# Patient Record
Sex: Male | Born: 1975 | Race: Black or African American | Hispanic: No | Smoking: Current every day smoker
Health system: Southern US, Community
[De-identification: ages and names within clinical notes are randomized; demographics above are authoritative.]

## PROBLEM LIST (undated history)

## (undated) DIAGNOSIS — F1910 Other psychoactive substance abuse, uncomplicated: Secondary | ICD-10-CM

## (undated) DIAGNOSIS — I10 Essential (primary) hypertension: Secondary | ICD-10-CM

## (undated) DIAGNOSIS — G7100 Muscular dystrophy, unspecified: Secondary | ICD-10-CM

## (undated) HISTORY — PX: NO PAST SURGERIES: SHX2092

## (undated) HISTORY — PX: OTHER SURGICAL HISTORY: SHX169

---

## 2013-05-19 ENCOUNTER — Emergency Department (HOSPITAL_COMMUNITY)
Admission: EM | Admit: 2013-05-19 | Discharge: 2013-05-19 | Disposition: A | Discharge status: Home / Self Care | Payer: Self-pay | Attending: Emergency Medicine | Admitting: Emergency Medicine

## 2013-05-19 ENCOUNTER — Emergency Department (HOSPITAL_COMMUNITY): Payer: Self-pay

## 2013-05-19 ENCOUNTER — Encounter (HOSPITAL_COMMUNITY): Payer: Self-pay | Admitting: Emergency Medicine

## 2013-05-19 DIAGNOSIS — X58XXXA Exposure to other specified factors, initial encounter: Secondary | ICD-10-CM | POA: Insufficient documentation

## 2013-05-19 DIAGNOSIS — S83005A Unspecified dislocation of left patella, initial encounter: Secondary | ICD-10-CM

## 2013-05-19 DIAGNOSIS — Y9383 Activity, rough housing and horseplay: Secondary | ICD-10-CM | POA: Insufficient documentation

## 2013-05-19 DIAGNOSIS — IMO0002 Reserved for concepts with insufficient information to code with codable children: Secondary | ICD-10-CM | POA: Insufficient documentation

## 2013-05-19 DIAGNOSIS — Y92009 Unspecified place in unspecified non-institutional (private) residence as the place of occurrence of the external cause: Secondary | ICD-10-CM | POA: Insufficient documentation

## 2013-05-19 DIAGNOSIS — S83006A Unspecified dislocation of unspecified patella, initial encounter: Secondary | ICD-10-CM | POA: Insufficient documentation

## 2013-05-19 DIAGNOSIS — F17200 Nicotine dependence, unspecified, uncomplicated: Secondary | ICD-10-CM | POA: Insufficient documentation

## 2013-05-19 MED ORDER — ETOMIDATE 2 MG/ML IV SOLN
10.0000 mg | Freq: Once | INTRAVENOUS | Status: AC
  Administered 2013-05-19: 10 mg via INTRAVENOUS
  Filled 2013-05-19: qty 10

## 2013-05-19 MED ORDER — HYDROMORPHONE HCL PF 1 MG/ML IJ SOLN: 1.0000 mg | Freq: Once | INTRAMUSCULAR | Status: DC

## 2013-05-19 MED ORDER — HYDROMORPHONE HCL PF 1 MG/ML IJ SOLN
1.0000 mg | Freq: Once | INTRAMUSCULAR | Status: AC
  Administered 2013-05-19: 1 mg via INTRAVENOUS
  Filled 2013-05-19: qty 1

## 2013-05-19 MED ORDER — HYDROCODONE-ACETAMINOPHEN 5-325 MG PO TABS: 2.0000 | ORAL_TABLET | ORAL | Status: DC | PRN

## 2013-05-19 MED ORDER — DIPHENHYDRAMINE HCL 50 MG/ML IJ SOLN
12.5000 mg | Freq: Once | INTRAMUSCULAR | Status: AC
  Administered 2013-05-19: 12.5 mg via INTRAVENOUS
  Filled 2013-05-19: qty 1

## 2013-05-19 NOTE — ED Notes (Signed)
Patient not breathing adequately to maintain O2 saturations. RT currently bagging patient at 100% O2. Sats Recovered to 99%.

## 2013-05-19 NOTE — ED Notes (Signed)
Procedure successful, reduction complete.

## 2013-05-19 NOTE — Progress Notes (Signed)
Orthopedic Tech Progress Note Patient Details:  Ronnie [name] 1975-09-29 [id number]  Ortho Devices Type of Ortho Device: Crutches   Haskell Flirt 05/19/2013, 3:11 AM

## 2013-05-19 NOTE — ED Notes (Signed)
Patient switched to non re-breather mask.

## 2013-05-19 NOTE — ED Notes (Signed)
Per EMS pt was wrestling at home on the floor and EMS states it appears pt has dislocated his left knee. Pt was given 100 mcg of fentanyl and it is reported the pt was drinking earlier this evening. Pt A&O X4. Pt placed in a left leg splint by EMS.

## 2013-05-19 NOTE — ED Provider Notes (Signed)
CSN: [id number]     Arrival date & time 05/19/13  0030 History   First MD Initiated Contact with Patient 05/19/13 0043     Chief Complaint  Patient presents with  . Knee Injury   (Consider location/radiation/quality/duration/timing/severity/associated sxs/prior Treatment) HPI 37 yo male presents to the ER from home via EMS with knee injury.  Pt reports he was wrestling with family when he had onset of deformity and pain to left kneecap.  No prior h/o same.  Pt is unable to bend the knee.  Pt received fentanyl in route without improvement.  Pt has been drinking tonight.  No other injuries or complaints.  History reviewed. No pertinent past medical history. History reviewed. No pertinent past surgical history. No family history on file. History  Substance Use Topics  . Smoking status: Current Every Day Smoker -- 0.50 packs/day  . Smokeless tobacco: Not on file  . Alcohol Use: Yes    Review of Systems  All other systems reviewed and are negative.    Allergies  Review of patient's allergies indicates no known allergies.  Home Medications  No current outpatient prescriptions on file. BP 145/100  Pulse 108  Temp(Src) 97.7 F (36.5 C) (Oral)  Resp 23  Ht 6' (1.829 m)  Wt 165 lb (74.844 kg)  BMI 22.37 kg/m2  SpO2 100% Physical Exam  Nursing note and vitals reviewed. Constitutional: He is oriented to person, place, and time. He appears well-developed and well-nourished. He appears distressed.  HENT:  Head: Normocephalic.  Nose: Nose normal.  Mouth/Throat: Oropharynx is clear and moist.  Abrasion left forehead  Eyes: Conjunctivae and EOM are normal. Pupils are equal, round, and reactive to light.  Neck: Normal range of motion. Neck supple. No JVD present. No tracheal deviation present. No thyromegaly present.  Cardiovascular: Normal rate, regular rhythm, normal heart sounds and intact distal pulses.  Exam reveals no gallop and no friction rub.   No murmur  heard. Pulmonary/Chest: Effort normal and breath sounds normal. No stridor. No respiratory distress. He has no wheezes. He has no rales. He exhibits no tenderness.  Abdominal: Soft. Bowel sounds are normal. He exhibits no distension and no mass. There is no tenderness. There is no rebound and no guarding.  Musculoskeletal: He exhibits tenderness.  Deformity noted to left patella.  Leg held in extension, lateral displacement of patella with rotation  Lymphadenopathy:    He has no cervical adenopathy.  Neurological: He is alert and oriented to person, place, and time. He exhibits normal muscle tone. Coordination normal.  Skin: Skin is warm and dry. No rash noted. No erythema. No pallor.  Psychiatric: He has a normal mood and affect. His behavior is normal. Judgment and thought content normal.    ED Course  Reduction of dislocation Date/Time: 05/19/2013 2:26 AM Performed by: Olivia Mackie Authorized by: Olivia Mackie Consent: Verbal consent obtained. written consent obtained. Risks and benefits: risks, benefits and alternatives were discussed Consent given by: patient and spouse Patient understanding: patient states understanding of the procedure being performed Patient consent: the patient's understanding of the procedure matches consent given Procedure consent: procedure consent matches procedure scheduled Relevant documents: relevant documents present and verified Test results: test results available and properly labeled Site marked: the operative site was marked Imaging studies: imaging studies available Required items: required blood products, implants, devices, and special equipment available Patient identity confirmed: verbally with patient and arm band Time out: Immediately prior to procedure a "time out" was called to verify  the correct patient, procedure, equipment, support staff and site/side marked as required. Preparation: Patient was prepped and draped in the usual sterile  fashion. Local anesthesia used: no Patient sedated: yes Sedatives: etomidate Analgesia: hydromorphone Sedation start date/time: 05/19/2013 2:04 AM Sedation end date/time: 05/19/2013 2:27 AM Vitals: Vital signs were monitored during sedation. Patient tolerance: Patient tolerated the procedure well with no immediate complications. Comments: Pt with lateral dislocation of patella.  Once patient was sedated, pt's knee was slowly flexed, and spontaneous reduction occurred.  Post procedure, patient had brief episode of hypoxia treated with bag valve mask.  Pt then recovered uneventfully.     (including critical care time) Labs Review Labs Reviewed - No data to display Imaging Review Dg Knee Complete 4 Views Left  05/19/2013   CLINICAL DATA:  Left knee injury, with left knee pain.  EXAM: LEFT KNEE - COMPLETE 4+ VIEW  COMPARISON:  None.  FINDINGS: There appears to be lateral dislocation of the patella, with associated abnormal rotation of the distal femur due to the position of the patella. The patella is lodged against the lateral femoral condyle. No definite knee joint effusion is identified, though it is difficult to assess for an effusion given the unusual positioning of the patella. No definite fracture is seen. The joint spaces are otherwise preserved. No significant degenerative change is seen.  The visualized soft tissues are normal in appearance.  IMPRESSION: Apparent lateral dislocation of the patella, with associated abnormal rotation of the distal femur due to the position of the patella; the patella is lodged against the lateral femoral condyle. No definite evidence of fracture.   Electronically Signed   By: Roanna Raider M.D.   On: 05/19/2013 01:17   Dg Knee Ap/lat W/sunrise Left  05/19/2013   CLINICAL DATA:  Postreduction  EXAM: DG KNEE - 3 VIEWS  COMPARISON:  Prior radiograph performed earlier on the same day  FINDINGS: The patella is now normally aligned with the intercondylar notch of  the distal femur. No acute fracture or or malalignment now seen about the knee. No significant joint effusion appreciated.  IMPRESSION: Patella in normal anatomic alignment status post reduction. No acute fracture.   Electronically Signed   By: Rise Mu M.D.   On: 05/19/2013 03:55    EKG Interpretation   None       MDM   1. Patellar dislocation, left, initial encounter    37 year old male with left patellar dislocation.  After conscious sedation able to easily reduce .  Reduction x-rays normal.  Patient placed in knee immobilizer and crutches, and told to followup with orthopedics.     Olivia Mackie, MD 05/19/13 2107

## 2013-12-31 ENCOUNTER — Encounter (HOSPITAL_COMMUNITY): Payer: Self-pay | Admitting: Emergency Medicine

## 2013-12-31 ENCOUNTER — Emergency Department (HOSPITAL_COMMUNITY)
Admission: EM | Admit: 2013-12-31 | Discharge: 2014-01-01 | Disposition: A | Discharge status: Home / Self Care | Payer: Self-pay | Attending: Emergency Medicine | Admitting: Emergency Medicine

## 2013-12-31 DIAGNOSIS — R209 Unspecified disturbances of skin sensation: Secondary | ICD-10-CM | POA: Insufficient documentation

## 2013-12-31 DIAGNOSIS — F17200 Nicotine dependence, unspecified, uncomplicated: Secondary | ICD-10-CM | POA: Insufficient documentation

## 2013-12-31 DIAGNOSIS — R202 Paresthesia of skin: Secondary | ICD-10-CM

## 2013-12-31 NOTE — ED Notes (Signed)
Pt. reports intermittent tingling/numbnes at both forearms and lower legs x1 month , denies injury or fall , ambulatory/respirations unlabored .

## 2014-01-01 LAB — I-STAT CHEM 8, ED
BUN: 7 mg/dL (ref 6–23)
Calcium, Ion: 1.06 mmol/L — ABNORMAL LOW (ref 1.12–1.23)
Chloride: 110 mEq/L (ref 96–112)
Creatinine, Ser: 1.4 mg/dL — ABNORMAL HIGH (ref 0.50–1.35)
Glucose, Bld: 91 mg/dL (ref 70–99)
HCT: 42 % (ref 39.0–52.0)
HEMOGLOBIN: 14.3 g/dL (ref 13.0–17.0)
Potassium: 3.8 mEq/L (ref 3.7–5.3)
SODIUM: 143 meq/L (ref 137–147)
TCO2: 20 mmol/L (ref 0–100)

## 2014-01-01 LAB — FOLATE: Folate: 8.1 ng/mL

## 2014-01-01 LAB — VITAMIN B12: VITAMIN B 12: 495 pg/mL (ref 211–911)

## 2014-01-01 NOTE — ED Provider Notes (Signed)
38 year old male who drinks heavily on the weekends and has a poor diet otherwise presents with approximately one month of numbness and tingling to his bilateral upper and lower extremities. He describes this as a feeling of my hands and legs are going to sleep. It occurs in a stocking glove fashion below the knees and below the elbows. he also has intermittent episodes of the same paresthetic feeling to his face.  He denies fevers chills nausea vomiting diarrhea, takes no medications, no multivitamins. On exam the patient has normal strength and sensation of all 4 extremities, normal coordination, clear heart and lung sounds and a normal mental status. Given the patient's dietary and alcohol drinking status I would consider folate deficiency, B12 deficiency, electrolytes or anemia, less likely to the multiple sclerosis but would need further testing if he does not improve with symptomatic treatment with a multivitamin. Labs drawn to rule out the above possible deficiencies.  Discussed with the patient who agrees.  Medical screening examination/treatment/procedure(s) were conducted as a shared visit with non-physician practitioner(s) and myself.  I personally evaluated the patient during the encounter.  Clinical Impression: Paresthesias      Vida Roller, MD 01/01/14 [phone]

## 2014-01-01 NOTE — ED Provider Notes (Signed)
CSN: [id number]     Arrival date & time 12/31/13  2256 History   First MD Initiated Contact with Patient 01/01/14 0004     Chief Complaint  Patient presents with  . Tingling  . Numbness     (Consider location/radiation/quality/duration/timing/severity/associated sxs/prior Treatment) HPI  38 year old male with no past medical history presents for evaluation of intermittent paresthesia to arms and legs in a stocking/glove distribution.  Pt report for the past 2-3 months he has been experiencing progressive pins and needles sensation to both arms and legs.  Initially tingling sensation would lasted for minutes but now it has been persistent for days.  He notices it more at night time, endorse tingling sensation to face as well.  He saw a commercial about diabetic neuropathy which worries him and prompted for ER visit.  Otherwise pt denies fever, chills, changes in vision, headache, difficulty breathing, dropping objects, pain to extremities, polyuria, polydipsia, or rash.  He works for a [employer].  No prior hx of diabetes or MS.  Does admits to drinking moderate amount of alcohol (80oz every other day for years).  Pt is a smoker.    History reviewed. No pertinent past medical history. History reviewed. No pertinent past surgical history. No family history on file. History  Substance Use Topics  . Smoking status: Current Every Day Smoker -- 0.50 packs/day  . Smokeless tobacco: Not on file  . Alcohol Use: Yes    Review of Systems  All other systems reviewed and are negative.     Allergies  Review of patient's allergies indicates no known allergies.  Home Medications   Prior to Admission medications   Medication Sig Start Date End Date Taking? Authorizing Provider  HYDROcodone-acetaminophen (NORCO/VICODIN) 5-325 MG per tablet Take 2 tablets by mouth every 4 (four) hours as needed. 05/19/13   Olivia Mackie, MD   BP 110/71  Pulse 103  Temp(Src) 97.9 F (36.6 C) (Oral)   Resp 14  Ht 5\' 11"  (1.803 m)  Wt 154 lb (69.854 kg)  BMI 21.49 kg/m2  SpO2 96% Physical Exam  Constitutional: He appears well-developed and well-nourished. No distress.  HENT:  Head: Atraumatic.  Mouth/Throat: Oropharynx is clear and moist.  Eyes: Conjunctivae and EOM are normal. Pupils are equal, round, and reactive to light.  Neck: Normal range of motion. Neck supple.  Cardiovascular: Normal rate and regular rhythm.   Pulmonary/Chest: Effort normal and breath sounds normal.  Abdominal: Soft. There is no tenderness.  Musculoskeletal:  5/5 strength to all 4 extremities  Neurological: He is alert.  Neurologic exam:  Speech a bit garble, pupils equal round reactive to light, extraocular movements intact  Normal peripheral visual fields Cranial nerves III through XII normal including no facial droop Follows commands, moves all extremities x4, normal strength to bilateral upper and lower extremities at all major muscle groups including grip Sensation normal to light touch and pinprick Coordination intact, no limb ataxia, finger-nose-finger normal Rapid alternating movements normal No pronator drift Gait normal   Skin: No rash noted.  Psychiatric: He has a normal mood and affect.    ED Course  Procedures (including critical care time)  12:22 AM Pt report subjective paresthesia to his forearms/hands/legs.  No focal neuro deficits on exam, no visual changes, normal strength.  Given that paresthesia to all extremities, i have low suspicion for MS.  Doubt stroke.  Will check labs for electrolytes abnormalities given that pt is a chronic alcohol drinker.  Will also check  CBG for signs of diabetes.  Pt will need resources for outpt care.  1:22 AM Electrolytes are unremarkable.  B12 and Folate level pending.  Evidence of renal insufficiency with Cr. 1.4  Pt made aware to f/u with PCP for further care.    Labs Review Labs Reviewed  I-STAT CHEM 8, ED - Abnormal; Notable for the  following:    Creatinine, Ser 1.40 (*)    Calcium, Ion 1.06 (*)    All other components within normal limits  FOLATE  VITAMIN B12    Imaging Review No results found.   EKG Interpretation None      MDM   Final diagnoses:  Paresthesia    BP 121/83  Pulse 93  Temp(Src) 98 F (36.7 C) (Oral)  Resp 15  Ht 5\' 11"  (1.803 m)  Wt 154 lb (69.854 kg)  BMI 21.49 kg/m2  SpO2 98%  I have reviewed nursing notes and vital signs. I reviewed available ER/hospitalization records thought the EMR     Fayrene HelperBowie Chance Munter, [place] JerseyPA-C 01/01/14 [id number]

## 2014-01-01 NOTE — ED Provider Notes (Signed)
Medical screening examination/treatment/procedure(s) were conducted as a shared visit with non-physician practitioner(s) and myself.  I personally evaluated the patient during the encounter  Please see my separate respective documentation pertaining to this patient encounter   Vida Roller, MD 01/01/14 [phone]

## 2014-01-01 NOTE — Discharge Instructions (Signed)
You may need a brain MRI for further evaluation of your numbness if it persists.  This can be done once you establish care through a primary care provider.  Use resources below to find a provider.  Drinking alcohol can worsen your symptoms.  Return to ER if you develop loss of vision, severe headache, or if you have other concerns.  Peripheral Neuropathy Peripheral neuropathy is a type of nerve damage. It affects nerves that carry signals between the spinal cord and other parts of the body. These are called peripheral nerves. With peripheral neuropathy, one nerve or a group of nerves may be damaged.  CAUSES  Many things can damage peripheral nerves. For some people with peripheral neuropathy, the cause is unknown. Some causes include:  Diabetes. This is the most common cause of peripheral neuropathy.  Injury to a nerve.  Pressure or stress on a nerve that lasts a long time.  Too little vitamin B. Alcoholism can lead to this.  Infections.  Autoimmune diseases, such as multiple sclerosis and systemic lupus erythematosus.  Inherited nerve diseases.  Some medicines, such as cancer drugs.  Toxic substances, such as lead and mercury.  Too little blood flowing to the legs.  Kidney disease.  Thyroid disease. SIGNS AND SYMPTOMS  Different people have different symptoms. The symptoms you have will depend on which of your nerves is damaged. Common symptoms include:  Loss of feeling (numbness) in the feet and hands.  Tingling in the feet and hands.  Pain that burns.  Very sensitive skin.  Weakness.  Not being able to move a part of the body (paralysis).  Muscle twitching.  Clumsiness or poor coordination.  Loss of balance.  Not being able to control your bladder.  Feeling dizzy.  Sexual problems. DIAGNOSIS  Peripheral neuropathy is a symptom, not a disease. Finding the cause of peripheral neuropathy can be hard. To figure that out, your health care provider will take a  medical history and do a physical exam. A neurological exam will also be done. This involves checking things affected by your brain, spinal cord, and nerves (nervous system). For example, your health care provider will check your reflexes, how you move, and what you can feel.  Other types of tests may also be ordered, such as:  Blood tests.  A test of the fluid in your spinal cord.  Imaging tests, such as CT scans or an MRI.  Electromyography (EMG). This test checks the nerves that control muscles.  Nerve conduction velocity tests. These tests check how fast messages pass through your nerves.  Nerve biopsy. A small piece of nerve is removed. It is then checked under a microscope. TREATMENT   Medicine is often used to treat peripheral neuropathy. Medicines may include:  Pain-relieving medicines. Prescription or over-the-counter medicine may be suggested.  Antiseizure medicine. This may be used for pain.  Antidepressants. These also may help ease pain from neuropathy.  Lidocaine. This is a numbing medicine. You might wear a patch or be given a shot.  Mexiletine. This medicine is typically used to help control irregular heart rhythms.  Surgery. Surgery may be needed to relieve pressure on a nerve or to destroy a nerve that is causing pain.  Physical therapy to help movement.  Assistive devices to help movement. HOME CARE INSTRUCTIONS   Only take over-the-counter or prescription medicines as directed by your health care provider. Follow the instructions carefully for any given medicines. Do not take any other medicines without first getting approval from  your health care provider.  If you have diabetes, work closely with your health care provider to keep your blood sugar under control.  If you have numbness in your feet:  Check every day for signs of injury or infection. Watch for redness, warmth, and swelling.  Wear padded socks and comfortable shoes. These help protect your  feet.  Do not do things that put pressure on your damaged nerve.  Do not smoke. Smoking keeps blood from getting to damaged nerves.  Avoid or limit alcohol. Too much alcohol can cause a lack of B vitamins. These vitamins are needed for healthy nerves.  Develop a good support system. Coping with peripheral neuropathy can be stressful. Talk to a mental health specialist or join a support group if you are struggling.  Follow up with your health care provider as directed. SEEK MEDICAL CARE IF:   You have new signs or symptoms of peripheral neuropathy.  You are struggling emotionally from dealing with peripheral neuropathy.  You have a fever. SEEK IMMEDIATE MEDICAL CARE IF:   You have an injury or infection that is not healing.  You feel very dizzy or begin vomiting.  You have chest pain.  You have trouble breathing. Document Released: 05/02/2002 Document Revised: 01/22/2011 Document Reviewed: 01/17/2013 [facility] Patient Information 2015 [place]. This information is not intended to replace advice given to you by your health care provider. Make sure you discuss any questions you have with your health care provider.

## 2014-02-27 ENCOUNTER — Encounter (HOSPITAL_COMMUNITY): Payer: Self-pay | Admitting: Emergency Medicine

## 2014-02-27 ENCOUNTER — Emergency Department (HOSPITAL_COMMUNITY)
Admission: EM | Admit: 2014-02-27 | Discharge: 2014-02-28 | Disposition: A | Discharge status: Home / Self Care | Payer: Self-pay | Attending: Emergency Medicine | Admitting: Emergency Medicine

## 2014-02-27 DIAGNOSIS — R110 Nausea: Secondary | ICD-10-CM

## 2014-02-27 DIAGNOSIS — Z720 Tobacco use: Secondary | ICD-10-CM | POA: Insufficient documentation

## 2014-02-27 DIAGNOSIS — R112 Nausea with vomiting, unspecified: Secondary | ICD-10-CM | POA: Insufficient documentation

## 2014-02-27 LAB — COMPREHENSIVE METABOLIC PANEL
ALT: 15 U/L (ref 0–53)
AST: 24 U/L (ref 0–37)
Albumin: 4.2 g/dL (ref 3.5–5.2)
Alkaline Phosphatase: 133 U/L — ABNORMAL HIGH (ref 39–117)
Anion gap: 17 — ABNORMAL HIGH (ref 5–15)
BUN: 9 mg/dL (ref 6–23)
CO2: 22 mEq/L (ref 19–32)
Calcium: 8.9 mg/dL (ref 8.4–10.5)
Chloride: 105 mEq/L (ref 96–112)
Creatinine, Ser: 1.11 mg/dL (ref 0.50–1.35)
GFR calc Af Amer: >90 mL/min (ref >90)
GFR calc non Af Amer: 82 mL/min — ABNORMAL LOW (ref >90)
Glucose, Bld: 89 mg/dL (ref 70–99)
Potassium: 4.1 mEq/L (ref 3.7–5.3)
Sodium: 144 mEq/L (ref 137–147)
Total Bilirubin: <0.2 mg/dL — ABNORMAL LOW (ref 0.3–1.2)
Total Protein: 8.2 g/dL (ref 6.0–8.3)

## 2014-02-27 LAB — CBC WITH DIFFERENTIAL/PLATELET
Basophils Absolute: 0 10*3/uL (ref 0.0–0.1)
Basophils Relative: 1 % (ref 0–1)
Eosinophils Absolute: 0 10*3/uL (ref 0.0–0.7)
Eosinophils Relative: 1 % (ref 0–5)
HCT: 38.2 % — ABNORMAL LOW (ref 39.0–52.0)
Hemoglobin: 13.9 g/dL (ref 13.0–17.0)
Lymphocytes Relative: 53 % — ABNORMAL HIGH (ref 12–46)
Lymphs Abs: 3.2 10*3/uL (ref 0.7–4.0)
MCH: 32.9 pg (ref 26.0–34.0)
MCHC: 36.4 g/dL — ABNORMAL HIGH (ref 30.0–36.0)
MCV: 90.3 fL (ref 78.0–100.0)
Monocytes Absolute: 0.2 10*3/uL (ref 0.1–1.0)
Monocytes Relative: 4 % (ref 3–12)
Neutro Abs: 2.6 10*3/uL (ref 1.7–7.7)
Neutrophils Relative %: 43 % (ref 43–77)
Platelets: 150 10*3/uL (ref 150–400)
RBC: 4.23 MIL/uL (ref 4.22–5.81)
RDW: 13.5 % (ref 11.5–15.5)
WBC: 6 10*3/uL (ref 4.0–10.5)

## 2014-02-27 NOTE — ED Notes (Signed)
Pt. reports neck pain onset last week denies injury , no fever or chills , pt. also reported diarrhea after eating fried chicken last Saturday with mild intermittent abdominal cramping .

## 2014-02-28 MED ORDER — SODIUM CHLORIDE 0.9 % IV BOLUS (SEPSIS)
1000.0000 mL | Freq: Once | INTRAVENOUS | Status: AC
  Administered 2014-02-28: 1000 mL via INTRAVENOUS

## 2014-02-28 MED ORDER — ONDANSETRON HCL 4 MG/2ML IJ SOLN
4.0000 mg | Freq: Once | INTRAMUSCULAR | Status: AC
  Administered 2014-02-28: 4 mg via INTRAVENOUS
  Filled 2014-02-28: qty 2

## 2014-02-28 NOTE — ED Notes (Signed)
Pt A&OX4, ambulatory at d/c with steady gait, NAD, reports he feels much better. Pt was able to tolerate fluids without vomiting and now denies nausea.

## 2014-02-28 NOTE — ED Notes (Signed)
Pt given cup of water for fluid challenge 

## 2014-02-28 NOTE — Discharge Instructions (Signed)
Nausea, Adult Nausea is the feeling that you have an upset stomach or have to vomit. Nausea by itself is not likely a serious concern, but it may be an early sign of more serious medical problems. As nausea gets worse, it can lead to vomiting. If vomiting develops, there is the risk of dehydration.  CAUSES   Viral infections.  Food poisoning.  Medicines.  Pregnancy.  Motion sickness.  Migraine headaches.  Emotional distress.  Severe pain from any source.  Alcohol intoxication. HOME CARE INSTRUCTIONS  Get plenty of rest.  Ask your caregiver about specific rehydration instructions.  Eat small amounts of food and sip liquids more often.  Take all medicines as told by your caregiver. SEEK MEDICAL CARE IF:  You have not improved after 2 days, or you get worse.  You have a headache. SEEK IMMEDIATE MEDICAL CARE IF:   You have a fever.  You faint.  You keep vomiting or have blood in your vomit.  You are extremely weak or dehydrated.  You have dark or bloody stools.  You have severe chest or abdominal pain. MAKE SURE YOU:  Understand these instructions.  Will watch your condition.  Will get help right away if you are not doing well or get worse. Document Released: 06/19/2004 Document Revised: 02/04/2012 Document Reviewed: 01/22/2011 [facility] Patient Information 2015 [place], [place]. This information is not intended to replace advice given to you by your health care provider. Make sure you discuss any questions you have with your health care provider. Try to eat lightly for the next 24 hours than resume your normal diet

## 2014-02-28 NOTE — ED Provider Notes (Signed)
CSN: [id number]     Arrival date & time 02/27/14  2129 History   First MD Initiated Contact with Patient 02/28/14 [phone]     Chief Complaint  Patient presents with  . Neck Pain  . Diarrhea     (Consider location/radiation/quality/duration/timing/severity/associated sxs/prior Treatment) HPI Comments: Pateint states on Saturday he was eatin gchicken and when he got down to the bone it was bloody  There were 2 pieces that were undercooked he ate on and his daughter ate one and both have been ill since.  Patient reports nausea, 2 episodes of vomiting on Sunday night and 1 episode of diarrhea on Sunday night with persistent nausea since   Has been able to drink alcohol since but is afraid to eat. Denies fever, chills, abdominal pain   Patient is a 38 y.o. male presenting with diarrhea and vomiting. The history is provided by the patient.  Diarrhea Associated symptoms: vomiting   Associated symptoms: no abdominal pain and no chills   Emesis Severity:  Mild Duration:  2 days Timing:  Intermittent Quality:  Bilious material Able to tolerate:  Liquids Progression:  Unchanged Chronicity:  New Recent urination:  Normal Relieved by:  Nothing Worsened by:  Food smell Ineffective treatments:  None tried Associated symptoms: diarrhea   Associated symptoms: no abdominal pain, no chills, no fever and no sore throat   Diarrhea:    Quality:  Semi-solid   Number of occurrences:  2   Severity:  Mild   Timing:  Intermittent   Progression:  Partially resolved Risk factors: suspect food intake     History reviewed. No pertinent past medical history. History reviewed. No pertinent past surgical history. No family history on file. History  Substance Use Topics  . Smoking status: Current Every Day Smoker -- 0.50 packs/day  . Smokeless tobacco: Not on file  . Alcohol Use: Yes    Review of Systems  Constitutional: Negative for chills.  HENT: Negative for sore throat.   Cardiovascular: Negative  for chest pain.  Gastrointestinal: Positive for nausea, vomiting and diarrhea. Negative for abdominal pain.  Skin: Negative for rash.  All other systems reviewed and are negative.     Allergies  Review of patient's allergies indicates no known allergies.  Home Medications   Prior to Admission medications   Not on File   BP 127/79  Pulse 113  Temp(Src) 98.4 F (36.9 C) (Oral)  Resp 16  Ht 6' (1.829 m)  Wt 157 lb (71.215 kg)  BMI 21.29 kg/m2  SpO2 100% Physical Exam  Vitals reviewed. Constitutional: He is oriented to person, place, and time. He appears well-developed and well-nourished.  HENT:  Head: Normocephalic.  Eyes: Pupils are equal, round, and reactive to light.  Neck: Normal range of motion.  Cardiovascular: Normal rate and regular rhythm.   Pulmonary/Chest: Effort normal and breath sounds normal.  Abdominal: Soft. Bowel sounds are normal. He exhibits no distension. There is no tenderness.  Musculoskeletal: Normal range of motion.  Neurological: He is alert and oriented to person, place, and time.  Skin: Skin is warm.    ED Course  Procedures (including critical care time) Labs Review Labs Reviewed  CBC WITH DIFFERENTIAL - Abnormal; Notable for the following:    HCT 38.2 (*)    MCHC 36.4 (*)    Lymphocytes Relative 53 (*)    All other components within normal limits  COMPREHENSIVE METABOLIC PANEL - Abnormal; Notable for the following:    Alkaline Phosphatase 133 (*)  Total Bilirubin <0.2 (*)    GFR calc non Af Amer 82 (*)    Anion gap 17 (*)    All other components within normal limits    Imaging Review No results found.   EKG Interpretation None     Patient is being an IV fluids, Zofran, feels, better.  He is successfully passed his fluid challenge testing will be discharged home MDM   Final diagnoses:  Nausea         Arman Filter, NP 02/28/14 [phone]

## 2014-03-03 NOTE — ED Provider Notes (Signed)
Medical screening examination/treatment/procedure(s) were performed by non-physician practitioner and as supervising physician I was immediately available for consultation/collaboration.   EKG Interpretation None       Gar Glance, MD 03/03/14 0108 

## 2014-07-09 ENCOUNTER — Emergency Department (HOSPITAL_COMMUNITY)
Admission: EM | Admit: 2014-07-09 | Discharge: 2014-07-09 | Disposition: A | Discharge status: Home / Self Care | Payer: Self-pay | Attending: Emergency Medicine | Admitting: Emergency Medicine

## 2014-07-09 ENCOUNTER — Emergency Department (HOSPITAL_COMMUNITY): Payer: Self-pay

## 2014-07-09 ENCOUNTER — Encounter (HOSPITAL_COMMUNITY): Payer: Self-pay

## 2014-07-09 DIAGNOSIS — Y998 Other external cause status: Secondary | ICD-10-CM | POA: Insufficient documentation

## 2014-07-09 DIAGNOSIS — Y9389 Activity, other specified: Secondary | ICD-10-CM | POA: Insufficient documentation

## 2014-07-09 DIAGNOSIS — S6721XA Crushing injury of right hand, initial encounter: Secondary | ICD-10-CM | POA: Insufficient documentation

## 2014-07-09 DIAGNOSIS — W208XXA Other cause of strike by thrown, projected or falling object, initial encounter: Secondary | ICD-10-CM | POA: Insufficient documentation

## 2014-07-09 DIAGNOSIS — Z79899 Other long term (current) drug therapy: Secondary | ICD-10-CM | POA: Insufficient documentation

## 2014-07-09 DIAGNOSIS — Z23 Encounter for immunization: Secondary | ICD-10-CM | POA: Insufficient documentation

## 2014-07-09 DIAGNOSIS — Y9289 Other specified places as the place of occurrence of the external cause: Secondary | ICD-10-CM | POA: Insufficient documentation

## 2014-07-09 DIAGNOSIS — Z720 Tobacco use: Secondary | ICD-10-CM | POA: Insufficient documentation

## 2014-07-09 MED ORDER — FENTANYL CITRATE 0.05 MG/ML IJ SOLN
50.0000 ug | Freq: Once | INTRAMUSCULAR | Status: AC
  Administered 2014-07-09: 50 ug via INTRAVENOUS
  Filled 2014-07-09: qty 2

## 2014-07-09 MED ORDER — HYDROCODONE-ACETAMINOPHEN 5-325 MG PO TABS: 2.0000 | ORAL_TABLET | ORAL | Status: DC | PRN

## 2014-07-09 MED ORDER — TETANUS-DIPHTH-ACELL PERTUSSIS 5-2.5-18.5 LF-MCG/0.5 IM SUSP
0.5000 mL | Freq: Once | INTRAMUSCULAR | Status: AC
  Administered 2014-07-09: 0.5 mL via INTRAMUSCULAR
  Filled 2014-07-09: qty 0.5

## 2014-07-09 NOTE — Discharge Instructions (Signed)
Crush Injury, Fingers or Toes °A crush injury to the fingers or toes means the tissues have been damaged by being squeezed (compressed). There will be bleeding into the tissues and swelling. Often, blood will collect under the skin. When this happens, the skin on the finger often dies and may slough off (shed) 1 week to 10 days later. Usually, new skin is growing underneath. If the injury has been too severe and the tissue does not survive, the damaged tissue may begin to turn black over several days.  °Wounds which occur because of the crushing may be stitched (sutured) shut. However, crush injuries are more likely to become infected than other injuries. These wounds may not be closed as tightly as other types of cuts to prevent infection. Nails involved are often lost. These usually grow back over several weeks.  °DIAGNOSIS °X-rays may be taken to see if there is any injury to the bones. °TREATMENT °Broken bones (fractures) may be treated with splinting, depending on the fracture. Often, no treatment is required for fractures of the last bone in the fingers or toes. °HOME CARE INSTRUCTIONS  °· The crushed part should be raised (elevated) above the heart or center of the chest as much as possible for the first several days or as directed. This helps with pain and lessens swelling. Less swelling increases the chances that the crushed part will survive. °· Put ice on the injured area. °¨ Put ice in a plastic bag. °¨ Place a towel between your skin and the bag. °¨ Leave the ice on for 15-20 minutes, 03-04 times a day for the first 2 days. °· Only take over-the-counter or prescription medicines for pain, discomfort, or fever as directed by your caregiver. °· Use your injured part only as directed. °· Change your bandages (dressings) as directed. °· Keep all follow-up appointments as directed by your caregiver. Not keeping your appointment could result in a chronic or permanent injury, pain, and disability. If there is  any problem keeping the appointment, you must call to reschedule. °SEEK IMMEDIATE MEDICAL CARE IF:  °· There is redness, swelling, or increasing pain in the wound area. °· Pus is coming from the wound. °· You have a fever. °· You notice a bad smell coming from the wound or dressing. °· The edges of the wound do not stay together after the sutures have been removed. °· You are unable to move the injured finger or toe. °MAKE SURE YOU:  °· Understand these instructions. °· Will watch your condition. °· Will get help right away if you are not doing well or get worse. °Document Released: 05/12/2005 Document Revised: 08/04/2011 Document Reviewed: 09/27/2010 °[employer]® Patient Information ©2015 [employer]. This information is not intended to replace advice given to you by your health care provider. Make sure you discuss any questions you have with your health care provider. ° °
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
## 2014-07-09 NOTE — ED Notes (Signed)
Cleaned and bandaged wound on right hand 4th finger.

## 2014-07-12 ENCOUNTER — Encounter (HOSPITAL_COMMUNITY): Payer: Self-pay | Admitting: Emergency Medicine

## 2014-07-12 ENCOUNTER — Emergency Department (INDEPENDENT_AMBULATORY_CARE_PROVIDER_SITE_OTHER)
Admission: EM | Admit: 2014-07-12 | Discharge: 2014-07-12 | Disposition: A | Discharge status: Home / Self Care | Payer: Self-pay | Source: Home / Self Care | Attending: Family Medicine | Admitting: Family Medicine

## 2014-07-12 DIAGNOSIS — T148XXA Other injury of unspecified body region, initial encounter: Secondary | ICD-10-CM

## 2014-07-12 DIAGNOSIS — T148 Other injury of unspecified body region: Secondary | ICD-10-CM
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
## 2014-07-12 NOTE — ED Provider Notes (Signed)
Ronnie [name] is a 39 y.o. male who presents to Urgent Care today for a finger injury. Patient suffered a crush injury to his right fourth digit on the 14th. He was seen in the emergency room where x-ray showed no fracture. He is here today for a note to return to work. He notes the pain is improving but still present. No radiating pain weakness or numbness. No difficulty with motion. No fevers or chills.   History reviewed. No pertinent past medical history. History reviewed. No pertinent past surgical history. History  Substance Use Topics  . Smoking status: Current Every Day Smoker -- 0.50 packs/day  . Smokeless tobacco: Not on file  . Alcohol Use: Yes   ROS as above Medications: No current facility-administered medications for this encounter.   Current Outpatient Prescriptions  Medication Sig Dispense Refill  . acetaminophen (TYLENOL) 325 MG tablet Take 650 mg by mouth every 6 (six) hours as needed for headache.    Marland Kitchen HYDROcodone-acetaminophen (NORCO/VICODIN) 5-325 MG per tablet Take 2 tablets by mouth every 4 (four) hours as needed. 10 tablet 0  . Multiple Vitamin (MULTIVITAMIN WITH MINERALS) TABS tablet Take 1 tablet by mouth daily.     No Known Allergies   Exam:  BP 114/72 mmHg  Pulse 85  Temp(Src) 97.9 F (36.6 C) (Oral)  Resp 18  SpO2 100% Gen: Well NAD Right fourth digit. Well-appearing laceration at the dorsal aspect of the finger near the distal end of the middle phalanx. Finger motion is normal. Strength is normal to flexion and extension. Capillary refill is intact distally.  No results found for this or any previous visit (from the past 24 hour(s)). No results found.  Assessment and Plan: 39 y.o. male with well-appearing injured. GERD. Appears to be healing well. Plan to buddy tape the fingers and keep the wound covered with antibiotic ointment. Return to work on the 19th.  Discussed warning signs or symptoms. Please see discharge instructions. Patient expresses  understanding.     Rodolph Bong, MD 07/12/14 2005

## 2014-07-12 NOTE — Discharge Instructions (Signed)
Thank you for coming in today. The wound protected with ointment and covered.  Buddy tape the fingers together. Return to work on Friday. Go to the ER if you get worse.    Buddy Taping You have a minor finger or toe injury. It can be managed by buddy taping. Buddy taping means the injured finger or toe is taped to a healthy uninjured adjacent finger or toe. Most minor fractures and dislocations of the smaller fingers and toes will heal in 3 to 4 weeks. Buddy taping immobilizes and protects the area of injury. Buddy taping is not recommended for initial treatment of fractures of the thumb, longer fingers, or the great toe. Buddy taping should not be used for unstable or deformed fractures, but as fracture healing progresses it may be used for protection during rehabilitation. Fractured fingers and toes should be protected by buddy taping as long as the injury is still painful or swollen.  When an injury is buddy taped, place a small piece of gauze or cotton between the digits that are taped. This helps prevent the skin from breaking down from increased moisture. Buddy taping allows you to get your injury wet when you bathe. Change the gauze and tape more often if it gets wet, and dry the space between the finger or toes. Use a sturdy, hard-soled shoe for better support if you have a fractured toe. In 2 to 3 weeks you can start motion exercises. This will keep the fingers or toes from becoming stiff.  SEEK IMMEDIATE MEDICAL CARE IF:   The injured area becomes cold, numb, or pale.  You have pain not controlled with medications.  You notice increasing deformity of the toe or finger. Document Released: 06/19/2004 Document Revised: 08/04/2011 Document Reviewed: 10/18/2008 [facility] Patient Information 2015 [place], [place]. This information is not intended to replace advice given to you by your health care provider. Make sure you discuss any questions you have with your health care provider.

## 2015-08-14 ENCOUNTER — Emergency Department (HOSPITAL_COMMUNITY)
Admission: EM | Admit: 2015-08-14 | Discharge: 2015-08-14 | Disposition: A | Discharge status: Home / Self Care | Payer: 59 | Source: Home / Self Care | Attending: Family Medicine | Admitting: Family Medicine

## 2015-08-14 DIAGNOSIS — R6889 Other general symptoms and signs: Secondary | ICD-10-CM | POA: Diagnosis not present

## 2015-08-14 NOTE — ED Provider Notes (Signed)
CSN: [id number]     Arrival date & time 08/14/15  1311 History   First MD Initiated Contact with Patient 08/14/15 1431     No chief complaint on file.  (Consider location/radiation/quality/duration/timing/severity/associated sxs/prior Treatment) HPI Pt presents with sore throat, body aches, fever, chills for 5 days Home treatment has been OTC meds without much relief of symptoms Fever is improved for short periods of time with OTC antipyretics. Pain score is 4 mostly from coughing and body aches Taking fluids, no appetite No flu shot Has been exposed to others with similar symptoms.  Denies: CP, SOB, vomiting or diarrhea.  No past medical history on file. No past surgical history on file. No family history on file. Social History  Substance Use Topics  . Smoking status: Current Every Day Smoker -- 0.50 packs/day  . Smokeless tobacco: Not on file  . Alcohol Use: Yes    Review of Systems See HPI Allergies  Review of patient's allergies indicates no known allergies.  Home Medications   Prior to Admission medications   Medication Sig Start Date End Date Taking? Authorizing Provider  acetaminophen (TYLENOL) 325 MG tablet Take 650 mg by mouth every 6 (six) hours as needed for headache.    Historical Provider, MD  HYDROcodone-acetaminophen (NORCO/VICODIN) 5-325 MG per tablet Take 2 tablets by mouth every 4 (four) hours as needed. 07/09/14   Nelva Nay, MD  Multiple Vitamin (MULTIVITAMIN WITH MINERALS) TABS tablet Take 1 tablet by mouth daily.    Historical Provider, MD   Meds Ordered and Administered this Visit  Medications - No data to display  There were no vitals taken for this visit. No data found.   Physical Exam NURSES NOTES AND VITAL SIGNS REVIEWED. CONSTITUTIONAL: Well developed, well nourished, no acute distress HEENT: normocephalic, atraumatic, right and left TM's are normal EYES: Conjunctiva normal NECK:normal ROM, supple, no adenopathy PULMONARY:No  respiratory distress, normal effort, Lungs: CTAb/l, no wheezes, or increased work of breathing CARDIOVASCULAR: RRR, no murmur ABDOMEN: soft, ND, NT, +'ve BS MUSCULOSKELETAL: Normal ROM of all extremities,  SKIN: warm and dry without rash PSYCHIATRIC: Mood and affect, behavior are normal  ED Course  Procedures (including critical care time)  Labs Review Labs Reviewed - No data to display  Imaging Review No results found.   Visual Acuity Review  Right Eye Distance:   Left Eye Distance:   Bilateral Distance:    Right Eye Near:   Left Eye Near:    Bilateral Near:      Discussed treatment options.  Opted for OTC meds no RX   MDM   1. Flu-like symptoms     Patient is reassured that there are no issues that require transfer to higher level of care at this time or additional tests. Patient is advised to continue home symptomatic treatment. Patient is advised that if there are new or worsening symptoms to attend the emergency department, contact primary care provider, or return to UC. Instructions of care provided discharged home in stable condition. Return to work/school note provided.   THIS NOTE WAS GENERATED USING A VOICE RECOGNITION SOFTWARE PROGRAM. ALL REASONABLE EFFORTS  WERE MADE TO PROOFREAD THIS DOCUMENT FOR ACCURACY.  I have verbally reviewed the discharge instructions with the patient. A printed AVS was given to the patient.  All questions were answered prior to discharge.      Tharon Aquas, PA 08/14/15 [phone]

## 2015-08-14 NOTE — ED Notes (Signed)
Pt has had sore throat,fatigue and cough for about 3 days and it is not getting better. Pt does not currently have a fever Pt alert and oriented

## 2015-08-14 NOTE — Discharge Instructions (Signed)

## 2016-09-11 ENCOUNTER — Inpatient Hospital Stay (HOSPITAL_COMMUNITY)
Admission: EM | Admit: 2016-09-11 | Discharge: 2016-09-16 | DRG: 871 | Disposition: A | Discharge status: Home / Self Care | Payer: BLUE CROSS/BLUE SHIELD | Attending: Internal Medicine | Admitting: Internal Medicine

## 2016-09-11 ENCOUNTER — Inpatient Hospital Stay (HOSPITAL_COMMUNITY): Payer: BLUE CROSS/BLUE SHIELD

## 2016-09-11 ENCOUNTER — Emergency Department (HOSPITAL_COMMUNITY): Payer: BLUE CROSS/BLUE SHIELD

## 2016-09-11 DIAGNOSIS — R51 Headache: Secondary | ICD-10-CM | POA: Diagnosis present

## 2016-09-11 DIAGNOSIS — G312 Degeneration of nervous system due to alcohol: Secondary | ICD-10-CM | POA: Diagnosis present

## 2016-09-11 DIAGNOSIS — F1010 Alcohol abuse, uncomplicated: Secondary | ICD-10-CM

## 2016-09-11 DIAGNOSIS — I10 Essential (primary) hypertension: Secondary | ICD-10-CM | POA: Diagnosis present

## 2016-09-11 DIAGNOSIS — J690 Pneumonitis due to inhalation of food and vomit: Secondary | ICD-10-CM | POA: Diagnosis present

## 2016-09-11 DIAGNOSIS — E872 Acidosis, unspecified: Secondary | ICD-10-CM

## 2016-09-11 DIAGNOSIS — J9600 Acute respiratory failure, unspecified whether with hypoxia or hypercapnia: Secondary | ICD-10-CM

## 2016-09-11 DIAGNOSIS — I959 Hypotension, unspecified: Secondary | ICD-10-CM | POA: Diagnosis present

## 2016-09-11 DIAGNOSIS — I428 Other cardiomyopathies: Secondary | ICD-10-CM | POA: Diagnosis present

## 2016-09-11 DIAGNOSIS — F14129 Cocaine abuse with intoxication, unspecified: Secondary | ICD-10-CM | POA: Diagnosis present

## 2016-09-11 DIAGNOSIS — A419 Sepsis, unspecified organism: Secondary | ICD-10-CM | POA: Diagnosis present

## 2016-09-11 DIAGNOSIS — F17200 Nicotine dependence, unspecified, uncomplicated: Secondary | ICD-10-CM

## 2016-09-11 DIAGNOSIS — N179 Acute kidney failure, unspecified: Secondary | ICD-10-CM | POA: Diagnosis present

## 2016-09-11 DIAGNOSIS — J9601 Acute respiratory failure with hypoxia: Secondary | ICD-10-CM

## 2016-09-11 DIAGNOSIS — R945 Abnormal results of liver function studies: Secondary | ICD-10-CM

## 2016-09-11 DIAGNOSIS — T510X1A Toxic effect of ethanol, accidental (unintentional), initial encounter: Secondary | ICD-10-CM | POA: Diagnosis present

## 2016-09-11 DIAGNOSIS — R9431 Abnormal electrocardiogram [ECG] [EKG]: Secondary | ICD-10-CM | POA: Diagnosis not present

## 2016-09-11 DIAGNOSIS — F17210 Nicotine dependence, cigarettes, uncomplicated: Secondary | ICD-10-CM | POA: Diagnosis present

## 2016-09-11 DIAGNOSIS — R7989 Other specified abnormal findings of blood chemistry: Secondary | ICD-10-CM

## 2016-09-11 DIAGNOSIS — J9602 Acute respiratory failure with hypercapnia: Secondary | ICD-10-CM | POA: Diagnosis present

## 2016-09-11 DIAGNOSIS — F1410 Cocaine abuse, uncomplicated: Secondary | ICD-10-CM

## 2016-09-11 DIAGNOSIS — R200 Anesthesia of skin: Secondary | ICD-10-CM

## 2016-09-11 DIAGNOSIS — Z4659 Encounter for fitting and adjustment of other gastrointestinal appliance and device: Secondary | ICD-10-CM

## 2016-09-11 DIAGNOSIS — M79609 Pain in unspecified limb: Secondary | ICD-10-CM | POA: Diagnosis not present

## 2016-09-11 DIAGNOSIS — E875 Hyperkalemia: Secondary | ICD-10-CM | POA: Diagnosis present

## 2016-09-11 DIAGNOSIS — E860 Dehydration: Secondary | ICD-10-CM | POA: Diagnosis present

## 2016-09-11 DIAGNOSIS — F1011 Alcohol abuse, in remission: Secondary | ICD-10-CM | POA: Insufficient documentation

## 2016-09-11 DIAGNOSIS — Z9911 Dependence on respirator [ventilator] status: Secondary | ICD-10-CM | POA: Diagnosis not present

## 2016-09-11 DIAGNOSIS — B955 Unspecified streptococcus as the cause of diseases classified elsewhere: Secondary | ICD-10-CM | POA: Diagnosis present

## 2016-09-11 DIAGNOSIS — R4182 Altered mental status, unspecified: Secondary | ICD-10-CM | POA: Diagnosis present

## 2016-09-11 DIAGNOSIS — R519 Headache, unspecified: Secondary | ICD-10-CM

## 2016-09-11 DIAGNOSIS — J189 Pneumonia, unspecified organism: Secondary | ICD-10-CM

## 2016-09-11 DIAGNOSIS — R402432 Glasgow coma scale score 3-8, at arrival to emergency department: Secondary | ICD-10-CM

## 2016-09-11 DIAGNOSIS — R40243 Glasgow coma scale score 3-8, unspecified time: Secondary | ICD-10-CM | POA: Diagnosis present

## 2016-09-11 HISTORY — DX: Other psychoactive substance abuse, uncomplicated: F19.10

## 2016-09-11 LAB — I-STAT ARTERIAL BLOOD GAS, ED
Acid-base deficit: 8 mmol/L — ABNORMAL HIGH (ref 0.0–2.0)
Bicarbonate: 19.7 mmol/L — ABNORMAL LOW (ref 20.0–28.0)
O2 SAT: 97 %
PCO2 ART: 50.8 mmHg — AB (ref 32.0–48.0)
PO2 ART: 112 mmHg — AB (ref 83.0–108.0)
Patient temperature: 98.6
TCO2: 21 mmol/L (ref 0–100)
pH, Arterial: 7.197 — CL (ref 7.350–7.450)

## 2016-09-11 LAB — LACTIC ACID, PLASMA
LACTIC ACID, VENOUS: 2.5 mmol/L — AB (ref 0.5–1.9)
Lactic Acid, Venous: 2 mmol/L (ref 0.5–1.9)

## 2016-09-11 LAB — COMPREHENSIVE METABOLIC PANEL
ALBUMIN: 4.3 g/dL (ref 3.5–5.0)
ALT: 68 U/L — AB (ref 17–63)
AST: 93 U/L — ABNORMAL HIGH (ref 15–41)
Alkaline Phosphatase: 126 U/L (ref 38–126)
Anion gap: 14 (ref 5–15)
BILIRUBIN TOTAL: 0.5 mg/dL (ref 0.3–1.2)
BUN: 24 mg/dL — AB (ref 6–20)
CALCIUM: 8.5 mg/dL — AB (ref 8.9–10.3)
CHLORIDE: 108 mmol/L (ref 101–111)
CO2: 20 mmol/L — ABNORMAL LOW (ref 22–32)
Creatinine, Ser: 2.34 mg/dL — ABNORMAL HIGH (ref 0.61–1.24)
GFR calc Af Amer: 38 mL/min — ABNORMAL LOW (ref >60)
GFR calc non Af Amer: 33 mL/min — ABNORMAL LOW (ref >60)
GLUCOSE: 90 mg/dL (ref 65–99)
POTASSIUM: 5.6 mmol/L — AB (ref 3.5–5.1)
Sodium: 142 mmol/L (ref 135–145)
TOTAL PROTEIN: 7.4 g/dL (ref 6.5–8.1)

## 2016-09-11 LAB — CBC WITH DIFFERENTIAL/PLATELET
BASOS ABS: 0 10*3/uL (ref 0.0–0.1)
Basophils Relative: 0 %
Eosinophils Absolute: 0 10*3/uL (ref 0.0–0.7)
Eosinophils Relative: 0 %
HEMATOCRIT: 41.3 % (ref 39.0–52.0)
Hemoglobin: 13.5 g/dL (ref 13.0–17.0)
LYMPHS PCT: 9 %
Lymphs Abs: 1.4 10*3/uL (ref 0.7–4.0)
MCH: 31.6 pg (ref 26.0–34.0)
MCHC: 32.7 g/dL (ref 30.0–36.0)
MCV: 96.7 fL (ref 78.0–100.0)
MONO ABS: 0.8 10*3/uL (ref 0.1–1.0)
Monocytes Relative: 6 %
NEUTROS ABS: 12.6 10*3/uL — AB (ref 1.7–7.7)
NEUTROS PCT: 85 %
Platelets: 192 10*3/uL (ref 150–400)
RBC: 4.27 MIL/uL (ref 4.22–5.81)
RDW: 14.5 % (ref 11.5–15.5)
WBC: 14.8 10*3/uL — AB (ref 4.0–10.5)

## 2016-09-11 LAB — I-STAT VENOUS BLOOD GAS, ED
Acid-base deficit: 5 mmol/L — ABNORMAL HIGH (ref 0.0–2.0)
BICARBONATE: 22.5 mmol/L (ref 20.0–28.0)
O2 Saturation: 74 %
PCO2 VEN: 53.1 mmHg (ref 44.0–60.0)
PH VEN: 7.235 — AB (ref 7.250–7.430)
PO2 VEN: 47 mmHg — AB (ref 32.0–45.0)
TCO2: 24 mmol/L (ref 0–100)

## 2016-09-11 LAB — I-STAT CHEM 8, ED
BUN: 33 mg/dL — AB (ref 6–20)
Calcium, Ion: 1.03 mmol/L — ABNORMAL LOW (ref 1.15–1.40)
Chloride: 111 mmol/L (ref 101–111)
Creatinine, Ser: 2.3 mg/dL — ABNORMAL HIGH (ref 0.61–1.24)
Glucose, Bld: 88 mg/dL (ref 65–99)
HEMATOCRIT: 43 % (ref 39.0–52.0)
HEMOGLOBIN: 14.6 g/dL (ref 13.0–17.0)
POTASSIUM: 5.5 mmol/L — AB (ref 3.5–5.1)
Sodium: 142 mmol/L (ref 135–145)
TCO2: 22 mmol/L (ref 0–100)

## 2016-09-11 LAB — GLUCOSE, CAPILLARY
GLUCOSE-CAPILLARY: 84 mg/dL (ref 65–99)
Glucose-Capillary: 104 mg/dL — ABNORMAL HIGH (ref 65–99)

## 2016-09-11 LAB — SALICYLATE LEVEL: Salicylate Lvl: <7 mg/dL (ref 2.8–30.0)

## 2016-09-11 LAB — TROPONIN I
Troponin I: 0.37 ng/mL (ref <0.03)
Troponin I: 0.48 ng/mL (ref <0.03)

## 2016-09-11 LAB — RAPID URINE DRUG SCREEN, HOSP PERFORMED
AMPHETAMINES: NOT DETECTED
BARBITURATES: NOT DETECTED
Benzodiazepines: NOT DETECTED
Cocaine: POSITIVE — AB
Opiates: NOT DETECTED
Tetrahydrocannabinol: NOT DETECTED

## 2016-09-11 LAB — I-STAT CG4 LACTIC ACID, ED: LACTIC ACID, VENOUS: 4.77 mmol/L — AB (ref 0.5–1.9)

## 2016-09-11 LAB — AMMONIA: Ammonia: 23 umol/L (ref 9–35)

## 2016-09-11 LAB — PROTIME-INR
INR: 1.04
PROTHROMBIN TIME: 13.6 s (ref 11.4–15.2)

## 2016-09-11 LAB — PHOSPHORUS: PHOSPHORUS: 6.3 mg/dL — AB (ref 2.5–4.6)

## 2016-09-11 LAB — PROCALCITONIN: Procalcitonin: 69.03 ng/mL

## 2016-09-11 LAB — CK: Total CK: 293 U/L (ref 49–397)

## 2016-09-11 LAB — MAGNESIUM: Magnesium: 2 mg/dL (ref 1.7–2.4)

## 2016-09-11 LAB — MRSA PCR SCREENING: MRSA BY PCR: NEGATIVE

## 2016-09-11 LAB — ETHANOL: ALCOHOL ETHYL (B): 8 mg/dL — AB (ref <5)

## 2016-09-11 LAB — ACETAMINOPHEN LEVEL: Acetaminophen (Tylenol), Serum: <10 ug/mL — ABNORMAL LOW (ref 10–30)

## 2016-09-11 MED ORDER — HEPARIN SODIUM (PORCINE) 5000 UNIT/ML IJ SOLN
5000.0000 [IU] | Freq: Three times a day (TID) | INTRAMUSCULAR | Status: DC
  Administered 2016-09-11 – 2016-09-15 (×12): 5000 [IU] via SUBCUTANEOUS
  Filled 2016-09-11 (×14): qty 1

## 2016-09-11 MED ORDER — PROPOFOL 1000 MG/100ML IV EMUL
INTRAVENOUS | Status: AC
  Filled 2016-09-11: qty 100

## 2016-09-11 MED ORDER — SODIUM CHLORIDE 0.9 % IV SOLN
INTRAVENOUS | Status: DC
  Administered 2016-09-11 – 2016-09-12 (×2): via INTRAVENOUS

## 2016-09-11 MED ORDER — PROPOFOL 1000 MG/100ML IV EMUL
5.0000 ug/kg/min | INTRAVENOUS | Status: DC
  Administered 2016-09-11: 50 ug/kg/min via INTRAVENOUS
  Administered 2016-09-11: 15 ug/kg/min via INTRAVENOUS
  Administered 2016-09-12 (×2): 50 ug/kg/min via INTRAVENOUS
  Filled 2016-09-11 (×3): qty 100

## 2016-09-11 MED ORDER — ORAL CARE MOUTH RINSE
15.0000 mL | Freq: Four times a day (QID) | OROMUCOSAL | Status: DC
  Administered 2016-09-12 (×4): 15 mL via OROMUCOSAL

## 2016-09-11 MED ORDER — SODIUM CHLORIDE 0.9 % IV SOLN
3.0000 g | Freq: Three times a day (TID) | INTRAVENOUS | Status: DC
  Administered 2016-09-11 – 2016-09-15 (×12): 3 g via INTRAVENOUS
  Filled 2016-09-11 (×14): qty 3

## 2016-09-11 MED ORDER — ROCURONIUM BROMIDE 50 MG/5ML IV SOLN
100.0000 mg | Freq: Once | INTRAVENOUS | Status: AC
  Administered 2016-09-11: 100 mg via INTRAVENOUS
  Filled 2016-09-11: qty 10

## 2016-09-11 MED ORDER — SODIUM CHLORIDE 0.9 % IV BOLUS (SEPSIS)
1000.0000 mL | Freq: Once | INTRAVENOUS | Status: AC
  Administered 2016-09-11: 1000 mL via INTRAVENOUS

## 2016-09-11 MED ORDER — ETOMIDATE 2 MG/ML IV SOLN
20.0000 mg | Freq: Once | INTRAVENOUS | Status: AC
  Administered 2016-09-11: 20 mg via INTRAVENOUS

## 2016-09-11 MED ORDER — VITAL HIGH PROTEIN PO LIQD
1000.0000 mL | ORAL | Status: DC
  Administered 2016-09-11: 1000 mL

## 2016-09-11 MED ORDER — CHLORHEXIDINE GLUCONATE 0.12% ORAL RINSE (MEDLINE KIT)
15.0000 mL | Freq: Two times a day (BID) | OROMUCOSAL | Status: DC
  Administered 2016-09-11 – 2016-09-12 (×2): 15 mL via OROMUCOSAL

## 2016-09-11 MED ORDER — PRO-STAT SUGAR FREE PO LIQD
30.0000 mL | Freq: Two times a day (BID) | ORAL | Status: DC
  Administered 2016-09-11 – 2016-09-12 (×2): 30 mL
  Filled 2016-09-11 (×2): qty 30

## 2016-09-11 MED ORDER — FOLIC ACID 5 MG/ML IJ SOLN
1.0000 mg | Freq: Every day | INTRAMUSCULAR | Status: DC
  Administered 2016-09-11 – 2016-09-12 (×2): 1 mg via INTRAVENOUS
  Filled 2016-09-11 (×3): qty 0.2

## 2016-09-11 MED ORDER — NALOXONE HCL 0.4 MG/ML IJ SOLN
0.4000 mg | Freq: Once | INTRAMUSCULAR | Status: AC
  Administered 2016-09-11: 0.4 mg via INTRAVENOUS
  Filled 2016-09-11: qty 1

## 2016-09-11 MED ORDER — PANTOPRAZOLE SODIUM 40 MG PO PACK
40.0000 mg | PACK | Freq: Every day | ORAL | Status: DC
  Administered 2016-09-11 – 2016-09-12 (×2): 40 mg
  Filled 2016-09-11 (×3): qty 20

## 2016-09-11 MED ORDER — DIPHENHYDRAMINE HCL 50 MG/ML IJ SOLN
25.0000 mg | Freq: Once | INTRAMUSCULAR | Status: AC
  Administered 2016-09-11: 25 mg via INTRAVENOUS
  Filled 2016-09-11: qty 1

## 2016-09-11 MED ORDER — SODIUM CHLORIDE 0.9 % IV SOLN: 250.0000 mL | INTRAVENOUS | Status: DC | PRN

## 2016-09-11 MED ORDER — THIAMINE HCL 100 MG/ML IJ SOLN
100.0000 mg | Freq: Every day | INTRAMUSCULAR | Status: DC
  Administered 2016-09-12 (×2): 100 mg via INTRAVENOUS
  Filled 2016-09-11 (×3): qty 1
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
## 2016-09-11 NOTE — ED Notes (Signed)
ED Provider at bedside. 
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
## 2016-09-11 NOTE — Progress Notes (Signed)
Patient intubated due unresponsiveness, good color change on ETCO2 detector, SATS 98%, placed on above vent settings, MD aware.

## 2016-09-11 NOTE — ED Notes (Signed)
Intubation by dr Adela Lank successful, color change noted.

## 2016-09-11 NOTE — ED Notes (Signed)
Family at bedside. 

## 2016-09-11 NOTE — ED Notes (Signed)
Dr Craige Cotta and Leitha Bleak, NP at bedside to see patient.

## 2016-09-11 NOTE — ED Notes (Signed)
Dr Adela Lank and respiratory therapist at bedside, preparing for intubation.

## 2016-09-11 NOTE — H&P (Signed)
PCCM Progress Note  Admission date: 09/11/2016 Referring provider: Adela Lank, ER  CC: Alerted mental status  HPI: 41 yo male went on drinking binge with his friends.  Apparently does cocaine from time to time also.  Girlfriend found him asleep in his car around 5 am.  She left him there since she couldn't get him out of car.  She was getting her god child ready for school and [name] was still in car.  She called her brother and they got [name] in the house.  After several more hours he still wasn't waking up, so he was brought to the hospital.  He was intubated for airway protection.  He then required sedation and paralytic because he started moving.  He was noted to have Lt lung infiltrate, lactic acidosis, and UDS positive for cocaine.  No significant past medical history or past surgical history.  No known allergies.  He does not take medications.  Girlfriend unable to provide family history.  He smokes cigarettes, does cocaine from time to time, and binge drinks alcohol on weekends.  Unable to obtain ROS.  Vital signs: BP 122/83   Pulse (!) 114   Temp 98.4 F (36.9 C)   Resp 16   Ht 5\' 8"  (1.727 m)   Wt 160 lb (72.6 kg)   SpO2 100%   BMI 24.33 kg/m   Intake/output: No intake/output data recorded.  General: chemically paralyzed Neuro: RASS -5 HEENT: Pupils mid point and reactive, ETT in place Cardiac: regular, tachycardic Chest: rales Lt > Rt, no wheeze Abd: soft, non tender, decreased bowel sounds Ext: no edema Skin: no rashes   CMP Latest Ref Rng & Units 09/11/2016 09/11/2016 02/27/2014  Glucose 65 - 99 mg/dL 88 90 89  BUN 6 - 20 mg/dL 16(X) 09(U) 9  Creatinine 0.61 - 1.24 mg/dL 0.45(W) 0.98(J) 1.91  Sodium 135 - 145 mmol/L 142 142 144  Potassium 3.5 - 5.1 mmol/L 5.5(H) 5.6(H) 4.1  Chloride 101 - 111 mmol/L 111 108 105  CO2 22 - 32 mmol/L - 20(L) 22  Calcium 8.9 - 10.3 mg/dL - 8.5(L) 8.9  Total Protein 6.5 - 8.1 g/dL - 7.4 8.2  Total Bilirubin 0.3 - 1.2  mg/dL - 0.5 <4.7(W)  Alkaline Phos 38 - 126 U/L - 126 133(H)  AST 15 - 41 U/L - 93(H) 24  ALT 17 - 63 U/L - 68(H) 15     CBC Latest Ref Rng & Units 09/11/2016 09/11/2016 02/27/2014  WBC 4.0 - 10.5 K/uL - 14.8(H) 6.0  Hemoglobin 13.0 - 17.0 g/dL 29.5 62.1 30.8  Hematocrit 39.0 - 52.0 % 43.0 41.3 38.2(L)  Platelets 150 - 400 K/uL - 192 150     ABG    Component Value Date/Time   PHART 7.197 (LL) 09/11/2016 1621   PCO2ART 50.8 (H) 09/11/2016 1621   PO2ART 112.0 (H) 09/11/2016 1621   HCO3 19.7 (L) 09/11/2016 1621   TCO2 21 09/11/2016 1621   ACIDBASEDEF 8.0 (H) 09/11/2016 1621   O2SAT 97.0 09/11/2016 1621     CBG (last 3)  No results for input(s): GLUCAP in the last 72 hours.   Imaging: Ct Head Wo Contrast  Result Date: 09/11/2016 CLINICAL DATA:  Altered mental status.  Possible drug overdose. EXAM: CT HEAD WITHOUT CONTRAST TECHNIQUE: Contiguous axial images were obtained from the base of the skull through the vertex without intravenous contrast. COMPARISON:  None. FINDINGS: Brain: No mass lesion, intraparenchymal hemorrhage or extra-axial collection. No evidence of acute cortical infarct.  Brain parenchyma and CSF-containing spaces are normal for age. Vascular: No hyperdense vessel or unexpected calcification. Skull: Normal visualized skull base, calvarium and extracranial soft tissues. Sinuses/Orbits: No sinus fluid levels or advanced mucosal thickening. No mastoid effusion. Normal orbits. IMPRESSION: Normal head CT. Electronically Signed   By: Deatra Robinson M.D.   On: 09/11/2016 16:00   Dg Chest Portable 1 View  Result Date: 09/11/2016 CLINICAL DATA:  41 year old male status post intubation. EXAM: PORTABLE CHEST 1 VIEW COMPARISON:  No priors. FINDINGS: An endotracheal tube is in place with tip 1.7 cm above the carina. A nasogastric tube is seen extending into the stomach, however, the tip of the nasogastric tube extends below the lower margin of the image. Lung volumes are low. Patchy  multifocal interstitial and airspace disease throughout the left mid to lower lung, concerning for pneumonia. No pleural effusions. No pneumothorax. No evidence of pulmonary edema. Heart size is normal. Upper mediastinal contours are within normal limits. IMPRESSION: 1. Support apparatus, as above. Endotracheal tube is slightly low lying, and could be withdrawn 3.3 cm for more optimal placement. 2. Patchy interstitial and airspace disease throughout the left mid to lower lung, concerning for bronchopneumonia. Followup PA and lateral chest X-ray is recommended in 3-4 weeks following trial of antibiotic therapy to ensure resolution and exclude underlying malignancy. Electronically Signed   By: Trudie Reed M.D.   On: 09/11/2016 16:36     Studies: CT head 4/19 >> normal  Antibiotics: unasyn 4/19 >>  Cultures: Blood culture 4/19 >> Sputum 4/19 >>  Lines/tubes: ETT 4/19 >>  Events: 4/19 Admit  Summary: 41 yo male smoker with altered mental status and acute respiratory failure in setting of binge alcohol and cocaine use.  Has Lt lung infiltrate and elevated lactic acid from sepsis and aspiration pneumonia.  Assessment/plan:  Acute hypoxic/hypercapnic respiratory failure with compromised airway. - full vent support - f/u CXR and ABG  Sepsis with lactic acidosis from aspiration pneumonia. - continue IV fluids - f/u lactic acid - add unasyn - check cultures  Acute encephalopathy with alcohol and cocaine. - RASS goal 0 to -1 once paralytic wears off - thiamine, folic acid, MVI - monitor for DTs  Acute renal failure >> unsure what baseline renal function is. Hyperkalemia in setting of acidosis. - continue IV fluids - f/u BMET, ABG  DVT prophylaxis - SQ heparin SUP - Protonix Nutrition - tube feeds Goals of care - full code  Updated patient's girlfriend at bedside  CC time 38 minutes  Coralyn Helling, MD [facility] Pulmonary/Critical Care 09/11/2016, 5:16 PM Pager:   [phone] After 3pm call: [phone]

## 2016-09-11 NOTE — ED Notes (Signed)
Pt asleep after labs and foley placed, dr Adela Lank made aware advised by this RN that I will hold off on restraints unless necessary. Dr Adela Lank okay with this plan.

## 2016-09-11 NOTE — Progress Notes (Signed)
Pharmacy Antibiotic Note  Kelley Dan is a 41 y.o. male admitted on 09/11/2016 with pneumonia.  Pharmacy has been consulted for Unasyn dosing.  Plan: Unasyn 3g every 8 hours  Follow renal function, culture results, LOT plans  Height: 5\' 8"  (172.7 cm) Weight: 160 lb (72.6 kg) IBW/kg (Calculated) : 68.4  Temp (24hrs), Avg:98.3 F (36.8 C), Min:97.9 F (36.6 C), Max:98.6 F (37 C)   Recent Labs Lab 09/11/16 1500 09/11/16 1521 09/11/16 1522  WBC 14.8*  --   --   CREATININE 2.34* 2.30*  --   LATICACIDVEN  --   --  4.77*    Estimated Creatinine Clearance: 40.5 mL/min (A) (by C-G formula based on SCr of 2.3 mg/dL (H)).    No Known Allergies  Antimicrobials this admission: 4/19 Unasyn >>   Dose adjustments this admission:  Microbiology results:  Thank you for allowing pharmacy to be a part of this patient's care.  Sherron Monday 09/11/2016 5:44 PM

## 2016-09-11 NOTE — ED Provider Notes (Addendum)
[place] DEPT Provider Note   CSN: [id number] Arrival date & time: 09/11/16  1426     History   Chief Complaint Chief Complaint  Patient presents with  . Altered Mental Status    HPI Ronnie [name] is a 41 y.o. male.  Level V caveat altered mental status. 41 year old male was found in his car this morning unresponsive. Was noted to be drinking last night. Has not had any purposeful movement since 5 AM. Family thought it was likely drunk. Called 911 since he is not woken up at this point.   The history is provided by the EMS personnel.  Illness  This is a new problem. The current episode started 1 to 2 hours ago. The problem occurs constantly. The problem has not changed since onset.Pertinent negatives include no chest pain, no abdominal pain, no headaches and no shortness of breath. Nothing aggravates the symptoms. Nothing relieves the symptoms. He has tried nothing for the symptoms. The treatment provided no relief.    No past medical history on file.  There are no active problems to display for this patient.   No past surgical history on file.     Home Medications    Prior to Admission medications   Medication Sig Start Date End Date Taking? Authorizing Provider  acetaminophen (TYLENOL) 325 MG tablet Take 650 mg by mouth every 6 (six) hours as needed for headache.    Historical Provider, MD  HYDROcodone-acetaminophen (NORCO/VICODIN) 5-325 MG per tablet Take 2 tablets by mouth every 4 (four) hours as needed. 07/09/14   Leonard Schwartz, MD  Multiple Vitamin (MULTIVITAMIN WITH MINERALS) TABS tablet Take 1 tablet by mouth daily.    Historical Provider, MD    Family History No family history on file.  Social History Social History  Substance Use Topics  . Smoking status: Current Every Day Smoker    Packs/day: 0.50  . Smokeless tobacco: Not on file  . Alcohol use Yes     Allergies   Patient has no known allergies.   Review of Systems Review of Systems    Unable to perform ROS: Patient unresponsive  Constitutional: Negative for chills and fever.  HENT: Negative for congestion and facial swelling.   Eyes: Negative for discharge and visual disturbance.  Respiratory: Negative for shortness of breath.   Cardiovascular: Negative for chest pain and palpitations.  Gastrointestinal: Negative for abdominal pain, diarrhea and vomiting.  Musculoskeletal: Negative for arthralgias and myalgias.  Skin: Negative for color change and rash.  Neurological: Negative for tremors, syncope and headaches.  Psychiatric/Behavioral: Negative for confusion and dysphoric mood.     Physical Exam Updated Vital Signs BP 123/74   Pulse (!) 116   Temp 98.6 F (37 C)   Resp 11   SpO2 95%   Physical Exam  Constitutional: He appears well-developed and well-nourished.  HENT:  Head: Normocephalic and atraumatic.  Eyes: EOM are normal. Pupils are equal, round, and reactive to light.  Neck: Normal range of motion. Neck supple. No JVD present.  Cardiovascular: Regular rhythm.  Tachycardia present.  Exam reveals no gallop and no friction rub.   No murmur heard. Pulmonary/Chest: No respiratory distress. He has no wheezes.  Abdominal: He exhibits no distension. There is no rebound and no guarding.  Musculoskeletal: Normal range of motion.  Neurological: He is alert. GCS eye subscore is 4. GCS verbal subscore is 1. GCS motor subscore is 3.  Eyes are open but does not respond to voice.  Arms are flexed at the  elbow. We'll  to go back in that position. Does not localize to trapezius pinch  Skin: No rash noted. No pallor.  Psychiatric: He has a normal mood and affect. His behavior is normal.  Nursing note and vitals reviewed.    ED Treatments / Results  Labs (all labs ordered are listed, but only abnormal results are displayed) Labs Reviewed  CBC WITH DIFFERENTIAL/PLATELET - Abnormal; Notable for the following:       Result Value   WBC 14.8 (*)    Neutro Abs 12.6  (*)    All other components within normal limits  I-STAT CHEM 8, ED - Abnormal; Notable for the following:    Potassium 5.5 (*)    BUN 33 (*)    Creatinine, Ser 2.30 (*)    Calcium, Ion 1.03 (*)    All other components within normal limits  I-STAT CG4 LACTIC ACID, ED - Abnormal; Notable for the following:    Lactic Acid, Venous 4.77 (*)    All other components within normal limits  PROTIME-INR  AMMONIA  COMPREHENSIVE METABOLIC PANEL  RAPID URINE DRUG SCREEN, HOSP PERFORMED  ETHANOL  ACETAMINOPHEN LEVEL  SALICYLATE LEVEL  BLOOD GAS, VENOUS  CK    EKG  EKG Interpretation  Date/Time:  Thursday September 11 2016 15:13:40 EDT Ventricular Rate:  133 PR Interval:    QRS Duration: 72 QT Interval:  315 QTC Calculation: 469 R Axis:   78 Text Interpretation:  Sinus tachycardia RSR' in V1 or V2, probably normal variant Left ventricular hypertrophy No significant change since last tracing Confirmed by Latavious Bitter MD, DANIEL [phone]) on 09/11/2016 3:32:22 PM       Radiology Ct Head Wo Contrast  Result Date: 09/11/2016 CLINICAL DATA:  Altered mental status.  Possible drug overdose. EXAM: CT HEAD WITHOUT CONTRAST TECHNIQUE: Contiguous axial images were obtained from the base of the skull through the vertex without intravenous contrast. COMPARISON:  None. FINDINGS: Brain: No mass lesion, intraparenchymal hemorrhage or extra-axial collection. No evidence of acute cortical infarct. Brain parenchyma and CSF-containing spaces are normal for age. Vascular: No hyperdense vessel or unexpected calcification. Skull: Normal visualized skull base, calvarium and extracranial soft tissues. Sinuses/Orbits: No sinus fluid levels or advanced mucosal thickening. No mastoid effusion. Normal orbits. IMPRESSION: Normal head CT. Electronically Signed   By: Ulyses Jarred M.D.   On: 09/11/2016 16:00    Procedures   Procedure Name: Intubation Date/Time: 09/11/2016 4:06 PM Performed by: Tyrone Nine Henretta Quist Oxygen Delivery Method:  Non-rebreather mask Preoxygenation: Pre-oxygenation with 100% oxygen Intubation Type: Rapid sequence Ventilation: Mask ventilation without difficulty Laryngoscope Size: Mac, 4 and Glidescope Grade View: Grade IV Tube size: 7.5 mm Number of attempts: 2 (intial with DL grade IV view) Placement Confirmation: ETT inserted through vocal cords under direct vision Secured at: 25 cm Tube secured with: ETT holder Difficulty Due To: Difficulty was anticipated and Difficult Airway- due to anterior larynx Future Recommendations: Recommend- induction with short-acting agent, and alternative techniques readily available Comments: Large epiglottis cords very inferior      BLADDER CATHETERIZATION Date/Time: 09/13/2016 9:31 AM Performed by: Tyrone Nine, Minal Stuller Authorized by: Deno Etienne   Consent:    Consent obtained:  Emergent situation   Consent given by:  Patient Pre-procedure details:    Procedure purpose:  Therapeutic Anesthesia (see MAR for exact dosages):    Anesthesia method:  None Procedure details:    Provider performed due to:  Nurse unable to complete   Catheter insertion:  Indwelling   Catheter type:  Foley  Catheter size:  12 Fr   Bladder irrigation: no     Number of attempts:  1   Urine characteristics:  Bloody Post-procedure details:    Patient tolerance of procedure:  Tolerated well, no immediate complications   (including critical care time)  Medications Ordered in ED Medications  propofol (DIPRIVAN) 1000 MG/100ML infusion (not administered)  naloxone (NARCAN) injection 0.4 mg (0.4 mg Intravenous Given 09/11/16 1445)  diphenhydrAMINE (BENADRYL) injection 25 mg (25 mg Intravenous Given 09/11/16 1445)  sodium chloride 0.9 % bolus 1,000 mL (1,000 mLs Intravenous New Bag/Given 09/11/16 1445)  etomidate (AMIDATE) injection 20 mg (20 mg Intravenous Given 09/11/16 1556)  rocuronium (ZEMURON) injection 100 mg (100 mg Intravenous Given 09/11/16 1557)     Initial Impression /  Assessment and Plan / ED Course  I have reviewed the triage vital signs and the nursing notes.  Pertinent labs & imaging results that were available during my care of the patient were reviewed by me and considered in my medical decision making (see chart for details).     41 yo M With a chief complaint of altered mental status. I'm unsure of the etiology of this. Patient appears to be protecting his airway on initial assessment though he has a very low GCS. Will attempt to give Narcan altered mental status workup with a CT of the head, vbg, Benadryl for possible dystonic reaction.   Patient continuing to be very unresponsive. Having apneic episodes lasting about 20 seconds without intervention. O2 sat drops into the mid 80s. At this point I do not feel he is protecting his airway.  Patient is actively retching but unable to produce vomitus. Intubated for airway protection.  CRITICAL CARE Performed by: Cecilio Asper   Total critical care time: 45 minutes  Critical care time was exclusive of separately billable procedures and treating other patients.  Critical care was necessary to treat or prevent imminent or life-threatening deterioration.  Critical care was time spent personally by me on the following activities: development of treatment plan with patient and/or surrogate as well as nursing, discussions with consultants, evaluation of patient's response to treatment, examination of patient, obtaining history from patient or surrogate, ordering and performing treatments and interventions, ordering and review of laboratory studies, ordering and review of radiographic studies, pulse oximetry and re-evaluation of patient's condition.  The patients results and plan were reviewed and discussed.   Any x-rays performed were independently reviewed by myself.   Differential diagnosis were considered with the presenting HPI.  Medications  propofol (DIPRIVAN) 1000 MG/100ML infusion (not  administered)  naloxone [facility]) injection 0.4 mg (0.4 mg Intravenous Given 09/11/16 1445)  diphenhydrAMINE (BENADRYL) injection 25 mg (25 mg Intravenous Given 09/11/16 1445)  sodium chloride 0.9 % bolus 1,000 mL (1,000 mLs Intravenous New Bag/Given 09/11/16 1445)  etomidate (AMIDATE) injection 20 mg (20 mg Intravenous Given 09/11/16 1556)  rocuronium (ZEMURON) injection 100 mg (100 mg Intravenous Given 09/11/16 1557)    Vitals:   09/11/16 1500 09/11/16 1530 09/11/16 1545 09/11/16 1600  BP: 124/89 136/80 123/74   Pulse: (!) 121 (!) 124 (!) 116   Resp: _0 Temp:  97.9 F (36.6 C) 98.2 F (36.8 C) 98.6 F (37 C)  SpO2: 97% 96% 95%     Final diagnoses:  Glasgow coma scale total score 3-8, at arrival to emergency department [facility])  Acute respiratory failure with hypoxia [facility])    Admission/ observation were discussed with the admitting physician, patient and/or family and  they are comfortable with the plan.   Final Clinical Impressions(s) / ED Diagnoses   Final diagnoses:  Glasgow coma scale total score 3-8, at arrival to emergency department [facility])  Acute respiratory failure with hypoxia [facility])    New Prescriptions New Prescriptions   No medications on file     Deno Etienne, DO 09/11/16 [place], DO 09/13/16 [id number]

## 2016-09-11 NOTE — ED Notes (Signed)
Etomidate 20mg  administered by Louann Liv

## 2016-09-11 NOTE — ED Notes (Signed)
Rocuronium 100mg  administered by Louann Liv

## 2016-09-11 NOTE — ED Notes (Signed)
Patient transported to CT 

## 2016-09-12 ENCOUNTER — Encounter (HOSPITAL_COMMUNITY): Payer: Self-pay

## 2016-09-12 ENCOUNTER — Inpatient Hospital Stay (HOSPITAL_COMMUNITY): Payer: BLUE CROSS/BLUE SHIELD

## 2016-09-12 DIAGNOSIS — Z9911 Dependence on respirator [ventilator] status: Secondary | ICD-10-CM

## 2016-09-12 DIAGNOSIS — Z4659 Encounter for fitting and adjustment of other gastrointestinal appliance and device: Secondary | ICD-10-CM

## 2016-09-12 DIAGNOSIS — R402432 Glasgow coma scale score 3-8, at arrival to emergency department: Secondary | ICD-10-CM

## 2016-09-12 LAB — BLOOD GAS, ARTERIAL
Acid-base deficit: 2.7 mmol/L — ABNORMAL HIGH (ref 0.0–2.0)
BICARBONATE: 23.1 mmol/L (ref 20.0–28.0)
Drawn by: 418751
FIO2: 40
LHR: 20 {breaths}/min
O2 SAT: 95.5 %
PEEP/CPAP: 5 cmH2O
Patient temperature: 98.2
VT: 500 mL
pCO2 arterial: 50.5 mmHg — ABNORMAL HIGH (ref 32.0–48.0)
pH, Arterial: 7.28 — ABNORMAL LOW (ref 7.350–7.450)
pO2, Arterial: 85.6 mmHg (ref 83.0–108.0)

## 2016-09-12 LAB — HIV ANTIBODY (ROUTINE TESTING W REFLEX): HIV SCREEN 4TH GENERATION: NONREACTIVE

## 2016-09-12 LAB — CBC
HEMATOCRIT: 37.4 % — AB (ref 39.0–52.0)
HEMOGLOBIN: 11.9 g/dL — AB (ref 13.0–17.0)
MCH: 30.9 pg (ref 26.0–34.0)
MCHC: 31.8 g/dL (ref 30.0–36.0)
MCV: 97.1 fL (ref 78.0–100.0)
Platelets: 137 10*3/uL — ABNORMAL LOW (ref 150–400)
RBC: 3.85 MIL/uL — ABNORMAL LOW (ref 4.22–5.81)
RDW: 14.9 % (ref 11.5–15.5)
WBC: 11.5 10*3/uL — ABNORMAL HIGH (ref 4.0–10.5)

## 2016-09-12 LAB — MAGNESIUM
MAGNESIUM: 2.1 mg/dL (ref 1.7–2.4)
MAGNESIUM: 2.2 mg/dL (ref 1.7–2.4)

## 2016-09-12 LAB — GLUCOSE, CAPILLARY
Glucose-Capillary: 100 mg/dL — ABNORMAL HIGH (ref 65–99)
Glucose-Capillary: 106 mg/dL — ABNORMAL HIGH (ref 65–99)
Glucose-Capillary: 83 mg/dL (ref 65–99)
Glucose-Capillary: 86 mg/dL (ref 65–99)
Glucose-Capillary: 96 mg/dL (ref 65–99)
Glucose-Capillary: 98 mg/dL (ref 65–99)

## 2016-09-12 LAB — BASIC METABOLIC PANEL
ANION GAP: 8 (ref 5–15)
BUN: 23 mg/dL — ABNORMAL HIGH (ref 6–20)
CHLORIDE: 111 mmol/L (ref 101–111)
CO2: 25 mmol/L (ref 22–32)
Calcium: 7.8 mg/dL — ABNORMAL LOW (ref 8.9–10.3)
Creatinine, Ser: 1.44 mg/dL — ABNORMAL HIGH (ref 0.61–1.24)
GFR calc Af Amer: >60 mL/min (ref >60)
GFR, EST NON AFRICAN AMERICAN: 59 mL/min — AB (ref >60)
GLUCOSE: 89 mg/dL (ref 65–99)
POTASSIUM: 4.1 mmol/L (ref 3.5–5.1)
SODIUM: 144 mmol/L (ref 135–145)

## 2016-09-12 LAB — PHOSPHORUS
PHOSPHORUS: 3.1 mg/dL (ref 2.5–4.6)
PHOSPHORUS: 1.4 mg/dL — AB (ref 2.5–4.6)

## 2016-09-12 LAB — PROCALCITONIN: Procalcitonin: 68.14 ng/mL

## 2016-09-12 LAB — TROPONIN I: TROPONIN I: 0.57 ng/mL — AB (ref <0.03)

## 2016-09-12 MED ORDER — SODIUM CHLORIDE 0.45 % IV SOLN
INTRAVENOUS | Status: DC
  Administered 2016-09-12 – 2016-09-14 (×5): via INTRAVENOUS

## 2016-09-12 MED ORDER — ONDANSETRON HCL 4 MG/2ML IJ SOLN
4.0000 mg | Freq: Four times a day (QID) | INTRAMUSCULAR | Status: DC | PRN
  Administered 2016-09-12: 4 mg via INTRAVENOUS
  Filled 2016-09-12: qty 2

## 2016-09-12 MED ORDER — ACETAMINOPHEN 500 MG PO TABS
1000.0000 mg | ORAL_TABLET | Freq: Four times a day (QID) | ORAL | Status: DC | PRN
  Administered 2016-09-12 – 2016-09-16 (×7): 1000 mg via ORAL
  Filled 2016-09-12 (×7): qty 2

## 2016-09-12 MED ORDER — ASPIRIN 81 MG PO CHEW
81.0000 mg | CHEWABLE_TABLET | Freq: Every day | ORAL | Status: DC
  Administered 2016-09-12 – 2016-09-16 (×5): 81 mg via ORAL
  Filled 2016-09-12 (×5): qty 1

## 2016-09-12 NOTE — Care Management Note (Signed)
Case Management Note  Patient Details  Name: Ronnie [name] MRN: [id number] Date of Birth: 1975/11/04  Subjective/Objective:   Pt admitted with AMS                    Action/Plan:  PTA independent from home with girlfriend.   Pt states he has active insurance through [employer] - has PCP and denied barriers to obtaining medications.  CSW consulted for substance abuse   Expected Discharge Date:                  Expected Discharge Plan:     In-House Referral:     Discharge planning Services  CM Consult  Post Acute Care Choice:    Choice offered to:     DME Arranged:    DME Agency:     HH Arranged:    HH Agency:     Status of Service:     If discussed at [employer], dates discussed:    Additional Comments:  Cherylann Parr, RN 09/12/2016, 1:55 PM

## 2016-09-12 NOTE — Progress Notes (Signed)
Nutrition Consult - Brief Note  Received MD Consult for TF initiation and management. Patient was extubated this morning and no longer requires TF. Plans to begin PO diet today. No nutrition issues identified, no interventions indicated at this time. Please re-consult as needed.  Joaquin Courts, RD, LDN, CNSC Pager [phone] After Hours Pager [phone]

## 2016-09-12 NOTE — Procedures (Signed)
Extubation Procedure Note  Patient Details:   Name: Esaw Diel DOB: 1975-11-01 MRN: [id number]   Airway Documentation:     Evaluation  O2 sats: stable throughout Complications: No apparent complications Patient did tolerate procedure well. Bilateral Breath Sounds: Clear, Diminished   Patient extubated to 2 L [place] without incident. Cuff leak noted prior to extubation. RN at bedside.   [place] M Sarh Kirschenbaum 09/12/2016, 11:51 AM

## 2016-09-12 NOTE — Progress Notes (Addendum)
PULMONARY  / CRITICAL CARE MEDICINE  Name: Ronnie [name] MRN: [id number] DOB: [birth year]    LOS: 1  REFERRING MD :  Ronnie [name], EDP  HPI: 41 yo male went on drinking binge with his friends.  Apparently does cocaine from time to time also.  Girlfriend found him asleep in his car around 5 am.  She left him there since she couldn't get him out of car.  She was getting her god child ready for school and Ronnie [name] was still in car.  She called her brother and they got Ronnie [name] in the house.  After several more hours he still wasn't waking up, so he was brought to the hospital.  He was intubated for airway protection.  He then required sedation and paralytic because he started moving.  He was noted to have Lt lung infiltrate, lactic acidosis, and UDS positive for cocaine.  LINES / TUBES: ETT 4/19 >>  CULTURES: Blood culture 4/19 >> Sputum 4/19 >>  ANTIBIOTICS: unasyn 4/19 >>  SIGNIFICANT EVENTS:  4/19 Admit, intubated   PAST MEDICAL HISTORY :  No past medical history on file. No past surgical history on file. Prior to Admission medications   Not on File   No Known Allergies  FAMILY HISTORY:  No family history on file. SOCIAL HISTORY:  reports that he has been smoking.  He has been smoking about 0.50 packs per day. He does not have any smokeless tobacco history on file. He reports that he drinks alcohol. His drug history is not on file.   VITAL SIGNS: Temp:  [97.9 F (36.6 C)-99.5 F (37.5 C)] 99.5 F (37.5 C) (04/20 0338) Pulse Rate:  [80-144] 95 (04/20 0600) Resp:  [11-21] 20 (04/20 0600) BP: (82-136)/(51-95) 86/52 (04/20 0600) SpO2:  [87 %-100 %] 100 % (04/20 0600) FiO2 (%):  [40 %] 40 % (04/20 0319) Weight:  [68 kg (149 lb 14.6 oz)-72.6 kg (160 lb)] 68.7 kg (151 lb 7.3 oz) (04/20 0407) HEMODYNAMICS:   VENTILATOR SETTINGS: Vent Mode: PRVC FiO2 (%):  [40 %] 40 % Set Rate:  [20 bmp] 20 bmp Vt Set:  [500 mL] 500 mL PEEP:  [5 cmH20] 5 cmH20 Plateau Pressure:  [15  cmH20-17 cmH20] 15 cmH20 INTAKE / OUTPUT: Intake/Output      04/19 0701 - 04/20 0700 04/20 0701 - 04/21 0700   I.V. (mL/kg) 1724.7 (25.1)    NG/GT 352    Total Intake(mL/kg) 2076.7 (30.2)    Urine (mL/kg/hr) 1400    Total Output 1400     Net +676.7            PHYSICAL EXAMINATION: General:  Ill appearing gentleman Neuro:  Sedated, grimaces to pain HEENT:  ETT in place Cardiovascular:  RRR, no murmurs, rubs or gallops Lungs:  Coarse breath sounds throughout Abdomen:  Soft, NDNT Skin:  intact   LABS: Cbc  Recent Labs Lab 09/11/16 1500 09/11/16 1521 09/12/16 0435  WBC 14.8*  --  11.5*  HGB 13.5 14.6 11.9*  HCT 41.3 43.0 37.4*  PLT 192  --  137*    Chemistry   Recent Labs Lab 09/11/16 1500 09/11/16 1521 09/11/16 1724 09/12/16 0435  NA 142 142  --  144  K 5.6* 5.5*  --  4.1  CL 108 111  --  111  CO2 20*  --   --  25  BUN 24* 33*  --  23*  CREATININE 2.34* 2.30*  --  1.44*  CALCIUM 8.5*  --   --  7.8*  MG  --   --  2.0 2.1  PHOS  --   --  6.3* 3.1  GLUCOSE 90 88  --  89    Liver fxn  Recent Labs Lab 09/11/16 1500  AST 93*  ALT 68*  ALKPHOS 126  BILITOT 0.5  PROT 7.4  ALBUMIN 4.3   coags  Recent Labs Lab 09/11/16 1500  INR 1.04   Sepsis markers  Recent Labs Lab 09/11/16 1522 09/11/16 1724 09/11/16 2122 09/12/16 0435  LATICACIDVEN 4.77* 2.5* 2.0*  --   PROCALCITON  --  69.03  --  68.14   Cardiac markers  Recent Labs Lab 09/11/16 1500 09/11/16 1724 09/11/16 2213 09/12/16 0435  CKTOTAL 293  --   --   --   TROPONINI  --  0.37* 0.48* 0.57*   BNP No results for input(s): PROBNP in the last 168 hours. ABG  Recent Labs Lab 09/11/16 1521 09/11/16 1621 09/11/16 1757 09/12/16 0341  PHART  --  7.197*  --  7.280*  PCO2ART  --  50.8*  --  50.5*  PO2ART  --  112.0*  --  85.6  HCO3  --  19.7* 22.5 23.1  TCO2 22 21 24   --     CBG trend  Recent Labs Lab 09/11/16 1925 09/11/16 2327 09/12/16 0339  GLUCAP 84 104* 96     IMAGING:  ECG:  DIAGNOSES: Active Problems:   Altered mental state   Acute kidney injury (HCC)   Acute respiratory failure (HCC)   Lactic acidosis   Smoker   ETOH abuse   Cocaine abuse   ASSESSMENT / [name]:  PULMONARY  ASSESSMENT: Acute hypoxic/hypercapneic respiratory failure, failure to protect airway - 2/2 heavy EtOH use and likely aspiration pneumonia [name]:   Continue vent support Continue Unasyn Weaning pcxr in am   CARDIOVASCULAR  ASSESSMENT: hypotension improved, r/o cocaine induced ischemia [name]:  Continue monitoring trop in am  ecg Echo Asa Avoid BB for unopposed alpha tele  RENAL  ASSESSMENT:  AKI on admission with hyperkalemia - improving, r/o mild rhabdo [name]:   NS 146ml/hr- to 1/2 NS, cl rising Follow BMETs  GASTROINTESTINAL  ASSESSMENT:  Tube feeds  [name]:   TF if not extubated protonix  HEMATOLOGIC  ASSESSMENT:  Dec in Hgb 11.9 this AM; no evidence of bleeding [name]:  Follow CBCs  INFECTIOUS  ASSESSMENT:  Aspiration pneumonia [name]:   Unasyn Follow CBCs, procalcitonin somewhat concerning, repeat in am   ENDOCRINE  ASSESSMENT:  No acute issues   [name]:   TF  NEUROLOGIC  ASSESSMENT:  Sedated on propofol currently Acute encephalopathy on admission 2/2 alcohol and cocaine [name]:   - RASS goal 0 to -1 once paralytic wears off - thiamine, folic acid, MVI - monitor for DTs  I have personally obtained a history, examined the patient, evaluated laboratory and imaging results, formulated the assessment and [name] and placed orders. CRITICAL CARE: The patient is critically ill with multiple organ systems failure and requires high complexity decision making for assessment and support, frequent evaluation and titration of therapies, application of advanced monitoring technologies and extensive interpretation of multiple databases. Critical Care Time devoted to patient care services described in this note is 33 minutes.   Ronnie Market, MD [facility] Critical [facility] Medicine [facility] Pager: [phone]  09/12/2016, 7:10 AM  STAFF NOTE: I, Ronnie Percy, MD FACP have personally reviewed patient's available data, including medical history, events of note, physical examination and test results as  part of my evaluation. I have discussed with resident/NP and other care providers such as pharmacist, RN and RRT. In addition, I personally evaluated patient and elicited key findings of  Awake, follow commands, nonfocal, lungs slight coarse, improving, abdo soft, no muscle pain or erythema, presents with etoh, cocoaine intox with arf, asp , sepsis, lactic down, pct concerning but had renal failure and he is clinically improving without any additional source, change to 1/2 NS with cl, wean aggressive to goal extubation if able, WUA prop, trop noted, trop in am , dc cocine, add asa, echo, no BB for now The patient is critically ill with multiple organ systems failure and requires high complexity decision making for assessment and support, frequent evaluation and titration of therapies, application of advanced monitoring technologies and extensive interpretation of multiple databases.   Critical Care Time devoted to patient care services described in this note is 30 Minutes. This time reflects time of care of this signee: Ronnie Percy, MD FACP. This critical care time does not reflect procedure time, or teaching time or supervisory time of PA/NP/Med student/Med Resident etc but could involve care discussion time. Rest per NP/medical resident whose note is outlined above and that I agree with   Mcarthur Rossetti. Tyson Alias, MD, FACP Pgr: [phone] [facility] 09/12/2016 11:34 AM

## 2016-09-12 NOTE — Progress Notes (Signed)
CRITICAL VALUE ALERT  Critical value received:  Lactic Acid 2.0  Date of notification: 09/11/16  Time of notification: 2213  Critical value read back: yes  Nurse who received alert:  Julius Bowels, RN  MD notified (1st page): Noemi Chapel, MD  Time of first page: [phone]

## 2016-09-13 ENCOUNTER — Inpatient Hospital Stay (HOSPITAL_COMMUNITY): Payer: BLUE CROSS/BLUE SHIELD

## 2016-09-13 DIAGNOSIS — R9431 Abnormal electrocardiogram [ECG] [EKG]: Secondary | ICD-10-CM

## 2016-09-13 DIAGNOSIS — J690 Pneumonitis due to inhalation of food and vomit: Secondary | ICD-10-CM

## 2016-09-13 LAB — GLUCOSE, CAPILLARY
GLUCOSE-CAPILLARY: 85 mg/dL (ref 65–99)
GLUCOSE-CAPILLARY: 82 mg/dL (ref 65–99)
Glucose-Capillary: 97 mg/dL (ref 65–99)
Glucose-Capillary: 110 mg/dL — ABNORMAL HIGH (ref 65–99)

## 2016-09-13 LAB — BASIC METABOLIC PANEL
Anion gap: 9 (ref 5–15)
BUN: 13 mg/dL (ref 6–20)
CHLORIDE: 106 mmol/L (ref 101–111)
CO2: 26 mmol/L (ref 22–32)
CREATININE: 1.01 mg/dL (ref 0.61–1.24)
Calcium: 8.6 mg/dL — ABNORMAL LOW (ref 8.9–10.3)
GFR calc Af Amer: >60 mL/min (ref >60)
GFR calc non Af Amer: >60 mL/min (ref >60)
GLUCOSE: 102 mg/dL — AB (ref 65–99)
POTASSIUM: 3.9 mmol/L (ref 3.5–5.1)
SODIUM: 141 mmol/L (ref 135–145)

## 2016-09-13 LAB — PROCALCITONIN: Procalcitonin: 34.17 ng/mL

## 2016-09-13 LAB — CBC
HEMATOCRIT: 35.3 % — AB (ref 39.0–52.0)
HEMOGLOBIN: 11.5 g/dL — AB (ref 13.0–17.0)
MCH: 31.1 pg (ref 26.0–34.0)
MCHC: 32.6 g/dL (ref 30.0–36.0)
MCV: 95.4 fL (ref 78.0–100.0)
Platelets: 129 10*3/uL — ABNORMAL LOW (ref 150–400)
RBC: 3.7 MIL/uL — AB (ref 4.22–5.81)
RDW: 14.4 % (ref 11.5–15.5)
WBC: 13.5 10*3/uL — ABNORMAL HIGH (ref 4.0–10.5)

## 2016-09-13 LAB — MAGNESIUM: Magnesium: 1.9 mg/dL (ref 1.7–2.4)

## 2016-09-13 LAB — ECHOCARDIOGRAM COMPLETE
HEIGHTINCHES: 68 in
WEIGHTICAEL: 2511.48 [oz_av]

## 2016-09-13 LAB — PHOSPHORUS: PHOSPHORUS: 2 mg/dL — AB (ref 2.5–4.6)

## 2016-09-13 LAB — TROPONIN I: Troponin I: 0.19 ng/mL (ref <0.03)

## 2016-09-13 MED ORDER — POTASSIUM PHOSPHATE MONOBASIC 500 MG PO TABS
500.0000 mg | ORAL_TABLET | Freq: Three times a day (TID) | ORAL | Status: DC
  Administered 2016-09-13: 500 mg via ORAL
  Filled 2016-09-13 (×4): qty 1

## 2016-09-13 MED ORDER — LABETALOL HCL 5 MG/ML IV SOLN
10.0000 mg | INTRAVENOUS | Status: DC | PRN
  Administered 2016-09-13 – 2016-09-14 (×5): 10 mg via INTRAVENOUS
  Filled 2016-09-13 (×6): qty 4

## 2016-09-13 MED ORDER — PANTOPRAZOLE SODIUM 40 MG PO TBEC
40.0000 mg | DELAYED_RELEASE_TABLET | Freq: Every day | ORAL | Status: DC
  Administered 2016-09-13 – 2016-09-16 (×4): 40 mg via ORAL
  Filled 2016-09-13 (×3): qty 1

## 2016-09-13 MED ORDER — FOLIC ACID 1 MG PO TABS
1.0000 mg | ORAL_TABLET | Freq: Every day | ORAL | Status: DC
  Administered 2016-09-13 – 2016-09-16 (×4): 1 mg via ORAL
  Filled 2016-09-13 (×4): qty 1

## 2016-09-13 MED ORDER — VITAMIN B-1 100 MG PO TABS
100.0000 mg | ORAL_TABLET | Freq: Every day | ORAL | Status: DC
  Administered 2016-09-13 – 2016-09-16 (×4): 100 mg via ORAL
  Filled 2016-09-13 (×4): qty 1

## 2016-09-13 MED ORDER — K PHOS MONO-SOD PHOS DI & MONO 155-852-130 MG PO TABS
500.0000 mg | ORAL_TABLET | Freq: Three times a day (TID) | ORAL | Status: AC
  Administered 2016-09-13 – 2016-09-15 (×5): 500 mg via ORAL
  Filled 2016-09-13 (×6): qty 2

## 2016-09-13 NOTE — Progress Notes (Signed)
c/o headaches, tylenol given

## 2016-09-13 NOTE — Evaluation (Addendum)
Physical Therapy Evaluation Patient Details Name: Ronnie [name] MRN: [id number] DOB: 07/25/1975 Today's Date: 09/13/2016   History of Present Illness  patient is a 41 yo male who presents with acute hypoxic/hypercapneic respiratory failure, failure to protect airway - 2/2 heavy EtOH use and likely aspiration pneumonia  Clinical Impression  Patient demonstrates deficits in functional mobility as indicated below. Will need continued skilled PT to address deficits and maximize function. Will see as indicated and progress as tolerated.      Follow Up Recommendations No PT follow up;Supervision for mobility/OOB    Equipment Recommendations  Cane   Recommendations for Other Services       Precautions / Restrictions Precautions Precautions: Fall Restrictions Weight Bearing Restrictions: No      Mobility  Bed Mobility Overal bed mobility: Modified Independent             General bed mobility comments: increased time to perform, no physical assist required  Transfers Overall transfer level: Needs assistance   Transfers: Sit to/from Stand Sit to Stand: Min assist         General transfer comment: min assist for stability with patient bracing calves against bed  Ambulation/Gait Ambulation/Gait assistance: Min assist Ambulation Distance (Feet): 140 Feet Assistive device: None Gait Pattern/deviations: Step-through pattern;Decreased stride length;Antalgic Gait velocity: decreased Gait velocity interpretation: Below normal speed for age/gender General Gait Details: patient with some noted instability, ocassional LOB requiring assist to maintain balance  Stairs            Wheelchair Mobility    Modified Rankin (Stroke Patients Only)       Balance Overall balance assessment: Needs assistance Sitting-balance support: Feet supported Sitting balance-Leahy Scale: Good     Standing balance support: No upper extremity supported Standing balance-Leahy Scale:  Fair Standing balance comment: can maintain upright without physical assist, but some increased sway noted                             Pertinent Vitals/Pain Pain Assessment: Faces Faces Pain Scale: Hurts little more Pain Location: LLE Pain Descriptors / Indicators: Aching;Discomfort Pain Intervention(s): Limited activity within patient's tolerance;Monitored during session    Home Living Family/patient expects to be discharged to:: Private residence Living Arrangements: Spouse/significant other Available Help at Discharge: Family;Friend(s) Type of Home: House Home Access: Level entry     Home Layout: One level Home Equipment: None      Prior Function Level of Independence: Independent               Hand Dominance   Dominant Hand: Right    Extremity/Trunk Assessment   Upper Extremity Assessment Upper Extremity Assessment: Overall WFL for tasks assessed    Lower Extremity Assessment Lower Extremity Assessment: LLE deficits/detail LLE: Unable to fully assess due to pain LLE Sensation: history of peripheral neuropathy       Communication   Communication: No difficulties  Cognition Arousal/Alertness: Awake/alert Behavior During Therapy: Flat affect Overall Cognitive Status: No family/caregiver present to determine baseline cognitive functioning Area of Impairment: Orientation;Safety/judgement;Problem solving                 Orientation Level: Disoriented to;Time;Situation       Safety/Judgement: Decreased awareness of safety   Problem Solving: Slow processing General Comments: Patient with flat affect and slow processing during session. Patient pleasent but slow with interaction      General Comments      Exercises  Assessment/Plan    PT Assessment Patient needs continued PT services  PT Problem List Decreased strength;Decreased activity tolerance;Decreased balance;Decreased mobility;Decreased coordination;Decreased  cognition       PT Treatment Interventions DME instruction;Gait training;Functional mobility training;Therapeutic activities;Therapeutic exercise;Balance training;Patient/family education    PT Goals (Current goals can be found in the Care Plan section)  Acute Rehab PT Goals Patient Stated Goal: to go home PT Goal Formulation: With patient Time For Goal Achievement: 09/27/16 Potential to Achieve Goals: Good    Frequency Min 3X/week   Barriers to discharge        Co-evaluation               End of Session Equipment Utilized During Treatment: Gait belt Activity Tolerance: Patient tolerated treatment well Patient left: in bed;with call bell/phone within reach Nurse Communication: Mobility status PT Visit Diagnosis: Unsteadiness on feet (R26.81)    Time: 0102-7253 PT Time Calculation (min) (ACUTE ONLY): 22 min   Charges:   PT Evaluation $PT Eval Moderate Complexity: 1 Procedure     PT G Codes:        Charlotte Crumb, PT DPT  [phone]   [name] 09/13/2016, 11:34 AM

## 2016-09-13 NOTE — Progress Notes (Signed)
[facility] PROGRESS NOTE  Ronnie [name]:[id number] DOB: Aug 16, 1975 DOA: 09/11/2016 PCP: No PCP Per Patient  Brief summary   41 yo male went on drinking binge with his friends. Apparently does cocaine from time to time also. Girlfriend found him asleep in his car around 5 am. She left him there since she couldn't get him out of car. She was getting her god child ready for school and Ronnie [name] was still in car. She called her brother and they got Ronnie [name] in the house. After several more hours he still wasn't waking up, so he was brought to the hospital. He was intubated for airway protection. He then required sedation and paralytic because he started moving. He was noted to have Lt lung infiltrate, lactic acidosis, and UDS positive for cocaine.   Assessment/Plan:  Acute hypoxic/hypercapneic respiratory failure, failure to protect airway - 2/2 heavy EtOH use and likely aspiration pneumonia -extubated. Stable. Cont to treat pneumonia  Hypotension likely due to pneumonia, dehydration, acute renal failure.  r/o cocaine induced ischemia -hypotension: resolved. Treat infection, iv fluids. Pend Echo, cont Asa, Avoid BB for unopposed alpha  AKI on admission with hyperkalemia, dehydration.  -Improving with iv fluids.   Acute encephalopathy in the setting of substance abuse, alcohol overdose. -mental status->improved, at baseline    Mild elevated trop. Cocaine use. No acute chest pains. f/u echo. On aspirin. Counseled to stop using drugs   Code Status: full Family Communication: d/w patient, rn (indicate person spoken with, relationship, and if by phone, the number) Disposition Plan: home soon    Consultants:  PCCM  LINES / TUBES: ETT 4/19 >>  CULTURES: Blood culture 4/19 >> Sputum 4/19 >>  ANTIBIOTICS: unasyn 4/19 >>  SIGNIFICANT EVENTS:  4/19 Admit, intubated   (indicate start date, and stop date if known)  HPI/Subjective: Alert, reports feeling  better.   Objective: Vitals:   09/13/16 0900 09/13/16 1000  BP: (!) 143/113 (!) 156/104  Pulse: 80 70  Resp: 10 (!) 21  Temp:      Intake/Output Summary (Last 24 hours) at 09/13/16 1142 Last data filed at 09/13/16 1000  Gross per 24 hour  Intake             2714 ml  Output             1005 ml  Net             1709 ml   Filed Weights   09/11/16 1857 09/12/16 0407 09/13/16 0500  Weight: 68 kg (149 lb 14.6 oz) 68.7 kg (151 lb 7.3 oz) 71.2 kg (156 lb 15.5 oz)    Exam:   General:  No distress   Cardiovascular: s1,s2 rrr  Respiratory: RLL rales   Abdomen: soft, nt  Musculoskeletal: no leg edema    Data Reviewed: Basic Metabolic Panel:  Recent Labs Lab 09/11/16 1500 09/11/16 1521 09/11/16 1724 09/12/16 0435 09/12/16 1703 09/13/16 0237  NA 142 142  --  144  --  141  K 5.6* 5.5*  --  4.1  --  3.9  CL 108 111  --  111  --  106  CO2 20*  --   --  25  --  26  GLUCOSE 90 88  --  89  --  102*  BUN 24* 33*  --  23*  --  13  CREATININE 2.34* 2.30*  --  1.44*  --  1.01  CALCIUM 8.5*  --   --  7.8*  --  8.6*  MG  --   --  2.0 2.1 2.2 1.9  PHOS  --   --  6.3* 3.1 1.4* 2.0*   Liver Function Tests:  Recent Labs Lab 09/11/16 1500  AST 93*  ALT 68*  ALKPHOS 126  BILITOT 0.5  PROT 7.4  ALBUMIN 4.3   No results for input(s): LIPASE, AMYLASE in the last 168 hours.  Recent Labs Lab 09/11/16 1431  AMMONIA 23   CBC:  Recent Labs Lab 09/11/16 1500 09/11/16 1521 09/12/16 0435 09/13/16 0237  WBC 14.8*  --  11.5* 13.5*  NEUTROABS 12.6*  --   --   --   HGB 13.5 14.6 11.9* 11.5*  HCT 41.3 43.0 37.4* 35.3*  MCV 96.7  --  97.1 95.4  PLT 192  --  137* 129*   Cardiac Enzymes:  Recent Labs Lab 09/11/16 1500 09/11/16 1724 09/11/16 2213 09/12/16 0435 09/13/16 0237  CKTOTAL 293  --   --   --   --   TROPONINI  --  0.37* 0.48* 0.57* 0.19*   BNP (last 3 results) No results for input(s): BNP in the last 8760 hours.  ProBNP (last 3 results) No results for  input(s): PROBNP in the last 8760 hours.  CBG:  Recent Labs Lab 09/12/16 1143 09/12/16 1627 09/12/16 1935 09/12/16 2340 09/13/16 0802  GLUCAP 100* 83 86 98 97    Recent Results (from the past 240 hour(s))  Culture, blood (routine x 2)     Status: None (Preliminary result)   Collection Time: 09/11/16  5:40 PM  Result Value Ref Range Status   Specimen Description BLOOD RIGHT ANTECUBITAL  Final   Special Requests   Final    BOTTLES DRAWN AEROBIC ONLY Blood Culture adequate volume   Culture NO GROWTH 2 DAYS  Final   Report Status PENDING  Incomplete  Culture, blood (routine x 2)     Status: None (Preliminary result)   Collection Time: 09/11/16  5:45 PM  Result Value Ref Range Status   Specimen Description BLOOD RIGHT HAND  Final   Special Requests   Final    BOTTLES DRAWN AEROBIC ONLY Blood Culture adequate volume   Culture NO GROWTH 2 DAYS  Final   Report Status PENDING  Incomplete  MRSA PCR Screening     Status: None   Collection Time: 09/11/16  6:48 PM  Result Value Ref Range Status   MRSA by PCR NEGATIVE NEGATIVE Final    Comment:        The GeneXpert MRSA Assay (FDA approved for NASAL specimens only), is one component of a comprehensive MRSA colonization surveillance program. It is not intended to diagnose MRSA infection nor to guide or monitor treatment for MRSA infections.   Culture, respiratory (NON-Expectorated)     Status: None (Preliminary result)   Collection Time: 09/11/16  6:54 PM  Result Value Ref Range Status   Specimen Description TRACHEAL ASPIRATE  Final   Special Requests NONE  Final   Gram Stain   Final    ABUNDANT WBC PRESENT,BOTH PMN AND MONONUCLEAR ABUNDANT GRAM POSITIVE COCCI IN PAIRS MODERATE GRAM NEGATIVE RODS MODERATE GRAM NEGATIVE COCCI IN PAIRS    Culture   Final    ABUNDANT UNIDENTIFIED ORGANISM IDENTIFICATION TO FOLLOW    Report Status PENDING  Incomplete     Studies: Ct Head Wo Contrast  Result Date: 09/11/2016 CLINICAL  DATA:  Altered mental status.  Possible drug overdose. EXAM: CT HEAD WITHOUT CONTRAST TECHNIQUE: Contiguous axial images were  obtained from the base of the skull through the vertex without intravenous contrast. COMPARISON:  None. FINDINGS: Brain: No mass lesion, intraparenchymal hemorrhage or extra-axial collection. No evidence of acute cortical infarct. Brain parenchyma and CSF-containing spaces are normal for age. Vascular: No hyperdense vessel or unexpected calcification. Skull: Normal visualized skull base, calvarium and extracranial soft tissues. Sinuses/Orbits: No sinus fluid levels or advanced mucosal thickening. No mastoid effusion. Normal orbits. IMPRESSION: Normal head CT. Electronically Signed   By: Deatra Robinson M.D.   On: 09/11/2016 16:00   Dg Chest Port 1 View  Result Date: 09/12/2016 CLINICAL DATA:  Vent dependent EXAM: PORTABLE CHEST 1 VIEW COMPARISON:  09/11/2016 FINDINGS: Mild patchy lingular/ left lower lobe opacity, atelectasis versus pneumonia. Right lung is essentially clear. No pleural effusion or pneumothorax. The heart is normal in size. Endotracheal tube terminates 2.5 cm above the carina. Enteric tube courses into the mid stomach. IMPRESSION: Endotracheal tube terminates 2.5 cm above the carina. Mild patchy lingular/ left lower lobe opacity, atelectasis versus pneumonia. Electronically Signed   By: Charline Bills M.D.   On: 09/12/2016 07:08   Dg Chest Portable 1 View  Result Date: 09/11/2016 CLINICAL DATA:  41 year old male status post intubation. EXAM: PORTABLE CHEST 1 VIEW COMPARISON:  No priors. FINDINGS: An endotracheal tube is in place with tip 1.7 cm above the carina. A nasogastric tube is seen extending into the stomach, however, the tip of the nasogastric tube extends below the lower margin of the image. Lung volumes are low. Patchy multifocal interstitial and airspace disease throughout the left mid to lower lung, concerning for pneumonia. No pleural effusions. No  pneumothorax. No evidence of pulmonary edema. Heart size is normal. Upper mediastinal contours are within normal limits. IMPRESSION: 1. Support apparatus, as above. Endotracheal tube is slightly low lying, and could be withdrawn 3.3 cm for more optimal placement. 2. Patchy interstitial and airspace disease throughout the left mid to lower lung, concerning for bronchopneumonia. Followup PA and lateral chest X-ray is recommended in 3-4 weeks following trial of antibiotic therapy to ensure resolution and exclude underlying malignancy. Electronically Signed   By: Trudie Reed M.D.   On: 09/11/2016 16:36   Dg Abd Portable 1v  Result Date: 09/11/2016 CLINICAL DATA:  Orogastric tube placement EXAM: PORTABLE ABDOMEN - 1 VIEW COMPARISON:  None. FINDINGS: Orogastric tube tip and side port overlie the gastric body. The bowel gas pattern is normal. No radio-opaque calculi or other significant radiographic abnormality are seen. IMPRESSION: Orogastric tube tip and side port overlie the gastric body. Electronically Signed   By: Deatra Robinson M.D.   On: 09/11/2016 21:49    Scheduled Meds: . aspirin  81 mg Oral Daily  . folic acid  1 mg Oral Daily  . heparin  5,000 Units Subcutaneous Q8H  . pantoprazole  40 mg Oral Daily  . thiamine  100 mg Oral Daily   Continuous Infusions: . sodium chloride 125 mL/hr at 09/13/16 0509  . ampicillin-sulbactam (UNASYN) IV Stopped (09/13/16 1000)    Active Problems:   Altered mental state   Acute kidney injury (HCC)   Acute respiratory failure (HCC)   Lactic acidosis   Smoker   ETOH abuse   Cocaine abuse   Ventilator dependent (HCC)   Encounter for orogastric (OG) tube placement    Time spent: >35 minutes     Esperanza Sheets  [facility] Pager [phone]. If 7PM-7AM, please contact night-coverage at [email or web address], password [facility] 09/13/2016, 11:42 AM  LOS: 2 days

## 2016-09-13 NOTE — Progress Notes (Signed)
CRITICAL VALUE ALERT  Critical value received: troponin 0.19  Date of notification:  09/13/16  Time of notification: 0338  Critical value read back: yes  Nurse who received alert: Katrinka Blazing, RN/Majesti Gambrell Zenaida Deed, RN  MD notified (1st page): Dr. Deitra Mayo  Time of first page: [phone]

## 2016-09-13 NOTE — Progress Notes (Signed)
*  PRELIMINARY RESULTS* Echocardiogram 2D Echocardiogram has been performed.  Ronnie [name] Corrine Tillis 09/13/2016, 3:40 PM

## 2016-09-13 NOTE — Progress Notes (Signed)
eLink Physician-Brief Progress Note Patient Name: Ronnie [name] DOB: 1975/07/18 MRN: [id number]   Date of Service  09/13/2016  HPI/Events of Note  Elevated blood pressure.  Recent cocaine use.  eICU Interventions  Prn labetalol.     Intervention Category Major Interventions: Other:  Kadance Mccuistion 09/13/2016, 4:51 PM

## 2016-09-14 DIAGNOSIS — J13 Pneumonia due to Streptococcus pneumoniae: Secondary | ICD-10-CM

## 2016-09-14 LAB — CULTURE, RESPIRATORY

## 2016-09-14 LAB — GLUCOSE, CAPILLARY
GLUCOSE-CAPILLARY: 85 mg/dL (ref 65–99)
Glucose-Capillary: 78 mg/dL (ref 65–99)
Glucose-Capillary: 84 mg/dL (ref 65–99)
Glucose-Capillary: 114 mg/dL — ABNORMAL HIGH (ref 65–99)

## 2016-09-14 LAB — CULTURE, RESPIRATORY W GRAM STAIN

## 2016-09-14 MED ORDER — AMLODIPINE BESYLATE 5 MG PO TABS
5.0000 mg | ORAL_TABLET | Freq: Every day | ORAL | Status: DC
  Administered 2016-09-14 – 2016-09-16 (×3): 5 mg via ORAL
  Filled 2016-09-14 (×3): qty 1

## 2016-09-14 NOTE — Progress Notes (Signed)
Pharmacy Antibiotic Note  Ronnie [name] is a 41 y.o. male admitted on 09/11/2016 with pneumonia.  Pharmacy has been consulted for Unasyn dosing. Pt is afebrile and WBC is elevated at 13.5. PCT remains elevated but is trending down. Planning to narrow to PO antibiotic in 24 hours per MD note.   Plan: Continue Unasyn 3g every 8 hours  Follow renal function, culture results, LOT plans  Height: 5\' 8"  (172.7 cm) Weight: 149 lb 0.5 oz (67.6 kg) IBW/kg (Calculated) : 68.4  Temp (24hrs), Avg:98.1 F (36.7 C), Min:97.9 F (36.6 C), Max:98.3 F (36.8 C)   Recent Labs Lab 09/11/16 1500 09/11/16 1521 09/11/16 1522 09/11/16 1724 09/11/16 2122 09/12/16 0435 09/13/16 0237  WBC 14.8*  --   --   --   --  11.5* 13.5*  CREATININE 2.34* 2.30*  --   --   --  1.44* 1.01  LATICACIDVEN  --   --  4.77* 2.5* 2.0*  --   --     Estimated Creatinine Clearance: 91.1 mL/min (by C-G formula based on SCr of 1.01 mg/dL).    No Known Allergies  Antimicrobials this admission: 4/19 Unasyn >>   Microbiology results: 4/19 MRSA - NEG 4/19 TA - strep pneumo 4/19 Blood - NGTD  Thank you for allowing pharmacy to be a part of this patient's care.  Latreshia Beauchaine, Drake Leach 09/14/2016 10:37 AM

## 2016-09-14 NOTE — Progress Notes (Signed)
[facility] PROGRESS NOTE  Ronnie [name] ZOX:[id number] DOB: 1975-10-21 DOA: 09/11/2016 PCP: No PCP Per Patient  Brief summary   41 yo male went on drinking binge with his friends. Apparently does cocaine from time to time also. Girlfriend found him asleep in his car around 5 am. She left him there since she couldn't get him out of car. She was getting her god child ready for school and Ronnie [name] was still in car. She called her brother and they got Ronnie [name] in the house. After several more hours he still wasn't waking up, so he was brought to the hospital. He was intubated for airway protection. He then required sedation and paralytic because he started moving. He was noted to have Lt lung infiltrate, lactic acidosis, and UDS positive for cocaine.   Assessment/Plan:  Acute hypoxic/hypercapneic respiratory failure, failure to protect airway - 2/2 heavy EtOH use and likely aspiration pneumonia -extubated. Stable. Cont to treat pneumonia  Pneumonia. CXR: Patchy interstitial and airspace disease throughout the left mid to lower lung, concerning for bronchopneumonia. Blood cultures: NGTD. Sputum+strep. improving on iv antibiotics. Transition to oral regimen in 24 hrs   Hypotension on admission likely due to pneumonia, dehydration, acute renal failure. 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 Treating infection, iv fluids. Echo: LVEF 35-40% . Diffuse  hypokinesis., cont Asa, Avoid BB for unopposed alpha. Counseled to stop using cocaine. Needs repeat echo, outpatient cardiology work up at discharge   AKI on admission with hyperkalemia, dehydration.  -Improving with iv fluids.   Acute encephalopathy in the setting of substance abuse, alcohol overdose. -mental status->improved, at baseline    Mild elevated trop. Probable cocaine induced. No acute chest pains. echo; ef-35-40%. Diffuse  hypokinesis.. On aspirin. May need bb to be started in few days as outpatient.  Counseled to stop using drugs   HTN. Started on amlodipine.   TF SDU-> Tele today 4/22  Code Status: full Family Communication: d/w patient, rn (indicate person spoken with, relationship, and if by phone, the number) Disposition Plan: home soon 24-48 hrs   Consultants:  PCCM  LINES / TUBES: ETT 4/19 >>  CULTURES: Blood culture 4/19 >> Sputum 4/19 >>  ANTIBIOTICS: unasyn 4/19 >>  SIGNIFICANT EVENTS:  4/19 Admit, intubated   (indicate start date, and stop date if known)  HPI/Subjective: Alert, reports feeling better.   Objective: Vitals:   09/14/16 0752 09/14/16 0758  BP:    Pulse: 69   Resp: 19   Temp:  98.2 F (36.8 C)    Intake/Output Summary (Last 24 hours) at 09/14/16 1023 Last data filed at 09/14/16 0802  Gross per 24 hour  Intake             2687 ml  Output             4725 ml  Net            -2038 ml   Filed Weights   09/12/16 0407 09/13/16 0500 09/14/16 0545  Weight: 68.7 kg (151 lb 7.3 oz) 71.2 kg (156 lb 15.5 oz) 67.6 kg (149 lb 0.5 oz)    Exam:   General:  No distress   Cardiovascular: s1,s2 rrr  Respiratory: RLL rales   Abdomen: soft, nt  Musculoskeletal: no leg edema    Data Reviewed: Basic Metabolic Panel:  Recent Labs Lab 09/11/16 1500 09/11/16 1521 09/11/16 1724 09/12/16 0435 09/12/16 1703 09/13/16 0237  NA 142 142  --  144  --  141  K  5.6* 5.5*  --  4.1  --  3.9  CL 108 111  --  111  --  106  CO2 20*  --   --  25  --  26  GLUCOSE 90 88  --  89  --  102*  BUN 24* 33*  --  23*  --  13  CREATININE 2.34* 2.30*  --  1.44*  --  1.01  CALCIUM 8.5*  --   --  7.8*  --  8.6*  MG  --   --  2.0 2.1 2.2 1.9  PHOS  --   --  6.3* 3.1 1.4* 2.0*   Liver Function Tests:  Recent Labs Lab 09/11/16 1500  AST 93*  ALT 68*  ALKPHOS 126  BILITOT 0.5  PROT 7.4  ALBUMIN 4.3   No results for input(s): LIPASE, AMYLASE in the last 168 hours.  Recent Labs Lab 09/11/16 1431  AMMONIA 23   CBC:  Recent Labs Lab  09/11/16 1500 09/11/16 1521 09/12/16 0435 09/13/16 0237  WBC 14.8*  --  11.5* 13.5*  NEUTROABS 12.6*  --   --   --   HGB 13.5 14.6 11.9* 11.5*  HCT 41.3 43.0 37.4* 35.3*  MCV 96.7  --  97.1 95.4  PLT 192  --  137* 129*   Cardiac Enzymes:  Recent Labs Lab 09/11/16 1500 09/11/16 1724 09/11/16 2213 09/12/16 0435 09/13/16 0237  CKTOTAL 293  --   --   --   --   TROPONINI  --  0.37* 0.48* 0.57* 0.19*   BNP (last 3 results) No results for input(s): BNP in the last 8760 hours.  ProBNP (last 3 results) No results for input(s): PROBNP in the last 8760 hours.  CBG:  Recent Labs Lab 09/13/16 1556 09/13/16 2012 09/14/16 0030 09/14/16 0353 09/14/16 0800  GLUCAP 85 82 85 78 84    Recent Results (from the past 240 hour(s))  Culture, blood (routine x 2)     Status: None (Preliminary result)   Collection Time: 09/11/16  5:40 PM  Result Value Ref Range Status   Specimen Description BLOOD RIGHT ANTECUBITAL  Final   Special Requests   Final    BOTTLES DRAWN AEROBIC ONLY Blood Culture adequate volume   Culture NO GROWTH 2 DAYS  Final   Report Status PENDING  Incomplete  Culture, blood (routine x 2)     Status: None (Preliminary result)   Collection Time: 09/11/16  5:45 PM  Result Value Ref Range Status   Specimen Description BLOOD RIGHT HAND  Final   Special Requests   Final    BOTTLES DRAWN AEROBIC ONLY Blood Culture adequate volume   Culture NO GROWTH 2 DAYS  Final   Report Status PENDING  Incomplete  MRSA PCR Screening     Status: None   Collection Time: 09/11/16  6:48 PM  Result Value Ref Range Status   MRSA by PCR NEGATIVE NEGATIVE Final    Comment:        The GeneXpert MRSA Assay (FDA approved for NASAL specimens only), is one component of a comprehensive MRSA colonization surveillance program. It is not intended to diagnose MRSA infection nor to guide or monitor treatment for MRSA infections.   Culture, respiratory (NON-Expectorated)     Status: None    Collection Time: 09/11/16  6:54 PM  Result Value Ref Range Status   Specimen Description TRACHEAL ASPIRATE  Final   Special Requests NONE  Final   Gram Stain  Final    ABUNDANT WBC PRESENT,BOTH PMN AND MONONUCLEAR ABUNDANT GRAM POSITIVE COCCI IN PAIRS MODERATE GRAM NEGATIVE RODS MODERATE GRAM NEGATIVE COCCI IN PAIRS    Culture ABUNDANT STREPTOCOCCUS PNEUMONIAE  Final   Report Status 09/14/2016 FINAL  Final   Organism ID, Bacteria STREPTOCOCCUS PNEUMONIAE  Final      Susceptibility   Streptococcus pneumoniae - MIC*    ERYTHROMYCIN <=0.12 SENSITIVE Sensitive     LEVOFLOXACIN 0.5 SENSITIVE Sensitive     PENICILLIN (meningitis) <=0.06 SENSITIVE Sensitive     PENICILLIN (non-meningitis) <=0.06 SENSITIVE Sensitive     CEFTRIAXONE (non-meningitis) <=0.12 SENSITIVE Sensitive     CEFTRIAXONE (meningitis) <=0.12 SENSITIVE Sensitive     * ABUNDANT STREPTOCOCCUS PNEUMONIAE     Studies: No results found.  Scheduled Meds: . aspirin  81 mg Oral Daily  . folic acid  1 mg Oral Daily  . heparin  5,000 Units Subcutaneous Q8H  . pantoprazole  40 mg Oral Daily  . phosphorus  500 mg Oral TID  . thiamine  100 mg Oral Daily   Continuous Infusions: . sodium chloride 125 mL/hr at 09/14/16 0144  . ampicillin-sulbactam (UNASYN) IV 3 g (09/14/16 0939)    Active Problems:   Altered mental state   Acute kidney injury (HCC)   Acute respiratory failure (HCC)   Lactic acidosis   Smoker   ETOH abuse   Cocaine abuse   Ventilator dependent (HCC)   Encounter for orogastric (OG) tube placement    Time spent: >35 minutes     Esperanza Sheets  [facility] Pager [phone]. If 7PM-7AM, please contact night-coverage at [email or web address], password [facility] 09/14/2016, 10:23 AM  LOS: 3 days

## 2016-09-15 ENCOUNTER — Inpatient Hospital Stay (HOSPITAL_COMMUNITY): Payer: BLUE CROSS/BLUE SHIELD

## 2016-09-15 DIAGNOSIS — F17200 Nicotine dependence, unspecified, uncomplicated: Secondary | ICD-10-CM

## 2016-09-15 DIAGNOSIS — I428 Other cardiomyopathies: Secondary | ICD-10-CM

## 2016-09-15 DIAGNOSIS — R7989 Other specified abnormal findings of blood chemistry: Secondary | ICD-10-CM

## 2016-09-15 MED ORDER — AMOXICILLIN-POT CLAVULANATE 875-125 MG PO TABS
1.0000 | ORAL_TABLET | Freq: Two times a day (BID) | ORAL | Status: DC
  Administered 2016-09-15 – 2016-09-16 (×3): 1 via ORAL
  Filled 2016-09-15 (×3): qty 1

## 2016-09-15 NOTE — Progress Notes (Signed)
Patient stated his left foot has been numb since Saturday, he stated he has notified his MD I will pass this forward to cureent MD. Roda Shutters.

## 2016-09-15 NOTE — Progress Notes (Addendum)
Physical Therapy Treatment Patient Details Name: Ronnie [name] MRN: [id number] DOB: 12-19-1975 Today's Date: 09/15/2016    History of Present Illness patient is a 41 yo male who presents with acute hypoxic/hypercapneic respiratory failure, failure to protect airway - 2/2 heavy EtOH use and likely aspiration pneumonia    PT Comments    Patient progressing well with mobility. Ambulated increased distance with and without assistive device. Patient much more steady with use of single point cane. Recommend use of one upon initial discharge. Patient in agreement. Will continue to see and progress as tolerated.   Follow Up Recommendations  No PT follow up;Supervision for mobility/OOB     Equipment Recommendations  Cane    Recommendations for Other Services       Precautions / Restrictions Precautions Precautions: Fall Restrictions Weight Bearing Restrictions: No    Mobility  Bed Mobility Overal bed mobility: Modified Independent               Transfers Overall transfer level: Needs assistance   Transfers: Sit to/from Stand Sit to Stand: Supervision         General transfer comment: no physical assist required  Ambulation/Gait Ambulation/Gait assistance: Supervision Ambulation Distance (Feet): 540 Feet Assistive device: Straight cane Gait Pattern/deviations: Step-through pattern;Decreased stride length;Antalgic Gait velocity: decreased Gait velocity interpretation: Below normal speed for age/gender General Gait Details: modest instability noted, ambulated without device with multiple balance checks, provided patient with SPC and patient moblizing well with improved gait speed and stability   Stairs            Wheelchair Mobility    Modified Rankin (Stroke Patients Only)       Balance Overall balance assessment: Needs assistance Sitting-balance support: Feet supported Sitting balance-Leahy Scale: Good     Standing balance support: No upper  extremity supported Standing balance-Leahy Scale: Fair                              Cognition Arousal/Alertness: Awake/alert Behavior During Therapy: WFL for tasks assessed/performed Overall Cognitive Status: Within Functional Limits for tasks assessed                                        Exercises      General Comments        Pertinent Vitals/Pain Pain Assessment: Faces Faces Pain Scale: Hurts a little bit Pain Location: LLE Pain Descriptors / Indicators: Sore    Home Living                      Prior Function            PT Goals (current goals can now be found in the care plan section) Acute Rehab PT Goals Patient Stated Goal: to go home PT Goal Formulation: With patient Time For Goal Achievement: 09/27/16 Potential to Achieve Goals: Good Progress towards PT goals: Progressing toward goals    Frequency    Min 3X/week      PT Plan Current plan remains appropriate    Co-evaluation             End of Session Equipment Utilized During Treatment: Gait belt Activity Tolerance: Patient tolerated treatment well Patient left: in chair;with call bell/phone within reach Nurse Communication: Mobility status PT Visit Diagnosis: Unsteadiness on feet (R26.81)     Time: 3299-2426 PT Time  Calculation (min) (ACUTE ONLY): 18 min  Charges:  $Gait Training: 8-22 mins                    G Codes:       Ronnie [name], Ronnie [name] DPT  [phone]    Ronnie [name] 09/15/2016, 2:29 PM

## 2016-09-15 NOTE — Progress Notes (Signed)
[facility] PROGRESS NOTE  Ronnie [name] TMY:[id number] DOB: 1975-08-09 DOA: 09/11/2016 PCP: No PCP Per Patient  Brief summary   41 yo male went on drinking binge with his friends. Apparently does cocaine from time to time also. Girlfriend found him asleep in his car around 5 am. She left him there since she couldn't get him out of car. She was getting her god child ready for school and Ronnie [name] was still in car. She called her brother and they got Ronnie [name] in the house. After several more hours he still wasn't waking up, so he was brought to the hospital. He was intubated for airway protection. He then required sedation and paralytic because he started moving. He was noted to have Lt lung infiltrate, lactic acidosis, and UDS positive for cocaine.   Assessment/Plan:  Acute hypoxic/hypercapneic respiratory failure, failure to protect airway - 2/2 heavy EtOH use and likely aspiration pneumonia -extubated. Stable. He was unasyn for 5days, changed to augmentin on 4/23 with clinical improvement,   Pneumonia. CXR: Patchy interstitial and airspace disease throughout the left mid to lower lung, concerning for bronchopneumonia. Blood cultures: NGTD. Sputum+strep. improving on iv antibiotics. Transition to augmentin on 4/23   Hypotension on admission likely due to pneumonia, dehydration, acute renal failure. 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 Treating infection, iv fluids. Echo: LVEF 35-40% . Diffuse  hypokinesis., cont Asa, Avoid BB for unopposed alpha. Counseled to stop using cocaine. patient expressed understanding about the Needs to repeat echo, outpatient cardiology work up.   Mild elevated trop.  0.37-0.57-0.19 Denies chest pain, Probable cocaine induced. No acute chest pains. echo; ef-35-40%. Diffuse  hypokinesis.. On aspirin.  Avoid Betablocker  For now until he can completely stop cocaine use. He is dry to euvolemic on exam Need outpatient cardiology  follow up  AKI on admission with hyperkalemia, dehydration.  -cr 2.4 on admission, cr 1.01 on 4/12, off iv fluids on 4/22. Ua was not obtained on admission, he does not have any urinary symptoms  Acute encephalopathy in the setting of substance abuse, alcohol overdose, infection, hypoxia -mental status->improved, at baseline     HTN. Started on amlodipine.   lft elevation with h/o alcohol use, denies abdominal pain, no n/v, will get hepatitis panel. Liver US  Substance abuse:  + cocaine, alcohol, cigarette  Cessation education provided, patient seems to be receptive  Headache/left foot numbness:  Repeat ct head He does seems to have dark toes on exam with good pedal pulse, he denies pain, but report being numb there is no bacteremia, echo no vegetation, will get abi to r/o pvd ( long time smoker)  Code Status: full Family Communication: d/w patient Disposition Plan: home , hopefully on 4/24   Consultants:  PCCM  LINES / TUBES: ETT 4/19 >>4/20  CULTURES: Blood culture 4/19 >>no growth Sputum 4/19 >> strep pneumo  ANTIBIOTICS: unasyn 4/19 >>4/23 augmentin from 4/23  SIGNIFICANT EVENTS:  4/19 Admit to icu , intubated, extubated on 4/20 Transferred to hospital medicine on 4/21  HPI/Subjective: c/o headache, c/o left foot being numb, mild left sided weakness, left toes being dark in color  Objective: Vitals:   09/15/16 1237 09/15/16 1527  BP: (!) 151/95 (!) 159/94  Pulse: 70 72  Resp:  18  Temp:  98 F (36.7 C)    Intake/Output Summary (Last 24 hours) at 09/15/16 1804 Last data filed at 09/15/16 1529  Gross per 24 hour  Intake  356 ml  Output              300 ml  Net               56 ml   Filed Weights   09/13/16 0500 09/14/16 0545 09/15/16 0500  Weight: 71.2 kg (156 lb 15.5 oz) 67.6 kg (149 lb 0.5 oz) 68.2 kg (150 lb 4.8 oz)    Exam:   General:  No distress   Cardiovascular: s1,s2 rrr  Respiratory: RLL rales   Abdomen: soft,  nt  Musculoskeletal: no leg edema  , toes on left side seems darker in color, good pedal pulse, toes cold to touch  Data Reviewed: Basic Metabolic Panel:  Recent Labs Lab 09/11/16 1500 09/11/16 1521 09/11/16 1724 09/12/16 0435 09/12/16 1703 09/13/16 0237  NA 142 142  --  144  --  141  K 5.6* 5.5*  --  4.1  --  3.9  CL 108 111  --  111  --  106  CO2 20*  --   --  25  --  26  GLUCOSE 90 88  --  89  --  102*  BUN 24* 33*  --  23*  --  13  CREATININE 2.34* 2.30*  --  1.44*  --  1.01  CALCIUM 8.5*  --   --  7.8*  --  8.6*  MG  --   --  2.0 2.1 2.2 1.9  PHOS  --   --  6.3* 3.1 1.4* 2.0*   Liver Function Tests:  Recent Labs Lab 09/11/16 1500  AST 93*  ALT 68*  ALKPHOS 126  BILITOT 0.5  PROT 7.4  ALBUMIN 4.3   No results for input(s): LIPASE, AMYLASE in the last 168 hours.  Recent Labs Lab 09/11/16 1431  AMMONIA 23   CBC:  Recent Labs Lab 09/11/16 1500 09/11/16 1521 09/12/16 0435 09/13/16 0237  WBC 14.8*  --  11.5* 13.5*  NEUTROABS 12.6*  --   --   --   HGB 13.5 14.6 11.9* 11.5*  HCT 41.3 43.0 37.4* 35.3*  MCV 96.7  --  97.1 95.4  PLT 192  --  137* 129*   Cardiac Enzymes:  Recent Labs Lab 09/11/16 1500 09/11/16 1724 09/11/16 2213 09/12/16 0435 09/13/16 0237  CKTOTAL 293  --   --   --   --   TROPONINI  --  0.37* 0.48* 0.57* 0.19*   BNP (last 3 results) No results for input(s): BNP in the last 8760 hours.  ProBNP (last 3 results) No results for input(s): PROBNP in the last 8760 hours.  CBG:  Recent Labs Lab 09/13/16 2012 09/14/16 0030 09/14/16 0353 09/14/16 0800 09/14/16 1144  GLUCAP 82 85 78 84 114*    Recent Results (from the past 240 hour(s))  Culture, blood (routine x 2)     Status: None (Preliminary result)   Collection Time: 09/11/16  5:40 PM  Result Value Ref Range Status   Specimen Description BLOOD RIGHT ANTECUBITAL  Final   Special Requests   Final    BOTTLES DRAWN AEROBIC ONLY Blood Culture adequate volume   Culture NO  GROWTH 4 DAYS  Final   Report Status PENDING  Incomplete  Culture, blood (routine x 2)     Status: None (Preliminary result)   Collection Time: 09/11/16  5:45 PM  Result Value Ref Range Status   Specimen Description BLOOD RIGHT HAND  Final   Special Requests   Final  BOTTLES DRAWN AEROBIC ONLY Blood Culture adequate volume   Culture NO GROWTH 4 DAYS  Final   Report Status PENDING  Incomplete  MRSA PCR Screening     Status: None   Collection Time: 09/11/16  6:48 PM  Result Value Ref Range Status   MRSA by PCR NEGATIVE NEGATIVE Final    Comment:        The GeneXpert MRSA Assay (FDA approved for NASAL specimens only), is one component of a comprehensive MRSA colonization surveillance program. It is not intended to diagnose MRSA infection nor to guide or monitor treatment for MRSA infections.   Culture, respiratory (NON-Expectorated)     Status: None   Collection Time: 09/11/16  6:54 PM  Result Value Ref Range Status   Specimen Description TRACHEAL ASPIRATE  Final   Special Requests NONE  Final   Gram Stain   Final    ABUNDANT WBC PRESENT,BOTH PMN AND MONONUCLEAR ABUNDANT GRAM POSITIVE COCCI IN PAIRS MODERATE GRAM NEGATIVE RODS MODERATE GRAM NEGATIVE COCCI IN PAIRS    Culture ABUNDANT STREPTOCOCCUS PNEUMONIAE  Final   Report Status 09/14/2016 FINAL  Final   Organism ID, Bacteria STREPTOCOCCUS PNEUMONIAE  Final      Susceptibility   Streptococcus pneumoniae - MIC*    ERYTHROMYCIN <=0.12 SENSITIVE Sensitive     LEVOFLOXACIN 0.5 SENSITIVE Sensitive     PENICILLIN (meningitis) <=0.06 SENSITIVE Sensitive     PENICILLIN (non-meningitis) <=0.06 SENSITIVE Sensitive     CEFTRIAXONE (non-meningitis) <=0.12 SENSITIVE Sensitive     CEFTRIAXONE (meningitis) <=0.12 SENSITIVE Sensitive     * ABUNDANT STREPTOCOCCUS PNEUMONIAE     Studies: No results found.  Scheduled Meds: . amLODipine  5 mg Oral Daily  . amoxicillin-clavulanate  1 tablet Oral Q12H  . aspirin  81 mg Oral Daily   . folic acid  1 mg Oral Daily  . heparin  5,000 Units Subcutaneous Q8H  . pantoprazole  40 mg Oral Daily  . thiamine  100 mg Oral Daily   Continuous Infusions:   Active Problems:   Altered mental state   Acute kidney injury (HCC)   Acute respiratory failure (HCC)   Lactic acidosis   Smoker   ETOH abuse   Cocaine abuse   Ventilator dependent (HCC)   Encounter for orogastric (OG) tube placement    Time spent: >35 minutes     Kais Monje MD PhD  [facility] Pager [phone]. If 7PM-7AM, please contact night-coverage at [email or web address], password [facility] 09/15/2016, 6:04 PM  LOS: 4 days

## 2016-09-16 ENCOUNTER — Inpatient Hospital Stay (HOSPITAL_COMMUNITY): Payer: BLUE CROSS/BLUE SHIELD

## 2016-09-16 DIAGNOSIS — M79609 Pain in unspecified limb: Secondary | ICD-10-CM

## 2016-09-16 LAB — CBC WITH DIFFERENTIAL/PLATELET
Basophils Absolute: 0 10*3/uL (ref 0.0–0.1)
Basophils Relative: 0 %
Eosinophils Absolute: 0.3 10*3/uL (ref 0.0–0.7)
Eosinophils Relative: 3 %
HEMATOCRIT: 37.4 % — AB (ref 39.0–52.0)
HEMOGLOBIN: 12.7 g/dL — AB (ref 13.0–17.0)
LYMPHS ABS: 2.1 10*3/uL (ref 0.7–4.0)
Lymphocytes Relative: 21 %
MCH: 30.9 pg (ref 26.0–34.0)
MCHC: 34 g/dL (ref 30.0–36.0)
MCV: 91 fL (ref 78.0–100.0)
MONOS PCT: 12 %
Monocytes Absolute: 1.2 10*3/uL — ABNORMAL HIGH (ref 0.1–1.0)
NEUTROS ABS: 6.6 10*3/uL (ref 1.7–7.7)
NEUTROS PCT: 64 %
PLATELETS: 202 10*3/uL (ref 150–400)
RBC: 4.11 MIL/uL — ABNORMAL LOW (ref 4.22–5.81)
RDW: 13.5 % (ref 11.5–15.5)
WBC: 10.2 10*3/uL (ref 4.0–10.5)

## 2016-09-16 LAB — COMPREHENSIVE METABOLIC PANEL
ALBUMIN: 3.2 g/dL — AB (ref 3.5–5.0)
ALT: 32 U/L (ref 17–63)
ANION GAP: 10 (ref 5–15)
AST: 41 U/L (ref 15–41)
Alkaline Phosphatase: 122 U/L (ref 38–126)
BUN: 6 mg/dL (ref 6–20)
CHLORIDE: 101 mmol/L (ref 101–111)
CO2: 30 mmol/L (ref 22–32)
Calcium: 9.2 mg/dL (ref 8.9–10.3)
Creatinine, Ser: 0.96 mg/dL (ref 0.61–1.24)
GFR calc Af Amer: >60 mL/min (ref >60)
GFR calc non Af Amer: >60 mL/min (ref >60)
GLUCOSE: 98 mg/dL (ref 65–99)
POTASSIUM: 2.8 mmol/L — AB (ref 3.5–5.1)
SODIUM: 141 mmol/L (ref 135–145)
Total Bilirubin: 0.7 mg/dL (ref 0.3–1.2)
Total Protein: 6.9 g/dL (ref 6.5–8.1)

## 2016-09-16 LAB — CULTURE, BLOOD (ROUTINE X 2)
CULTURE: NO GROWTH
Culture: NO GROWTH
Special Requests: ADEQUATE
Special Requests: ADEQUATE

## 2016-09-16 LAB — LIPID PANEL
CHOLESTEROL: 157 mg/dL (ref 0–200)
HDL: 63 mg/dL (ref >40)
LDL Cholesterol: 81 mg/dL (ref 0–99)
TRIGLYCERIDES: 67 mg/dL (ref <150)
Total CHOL/HDL Ratio: 2.5 RATIO
VLDL: 13 mg/dL (ref 0–40)

## 2016-09-16 LAB — URINALYSIS, ROUTINE W REFLEX MICROSCOPIC
BACTERIA UA: NONE SEEN
BILIRUBIN URINE: NEGATIVE
Glucose, UA: NEGATIVE mg/dL
Hgb urine dipstick: NEGATIVE
KETONES UR: NEGATIVE mg/dL
LEUKOCYTES UA: NEGATIVE
Nitrite: NEGATIVE
PROTEIN: 30 mg/dL — AB
Specific Gravity, Urine: 1.023 (ref 1.005–1.030)
pH: 6 (ref 5.0–8.0)

## 2016-09-16 MED ORDER — AMLODIPINE BESYLATE 5 MG PO TABS: 5.0000 mg | ORAL_TABLET | Freq: Every day | ORAL | 0 refills | Status: DC

## 2016-09-16 MED ORDER — MAGNESIUM SULFATE 2 GM/50ML IV SOLN
2.0000 g | Freq: Once | INTRAVENOUS | Status: DC
  Filled 2016-09-16: qty 50

## 2016-09-16 MED ORDER — AMOXICILLIN-POT CLAVULANATE 875-125 MG PO TABS: 1.0000 | ORAL_TABLET | Freq: Two times a day (BID) | ORAL | 0 refills | Status: AC

## 2016-09-16 MED ORDER — ASPIRIN 81 MG PO CHEW
81.0000 mg | CHEWABLE_TABLET | Freq: Every day | ORAL | 0 refills | Status: DC
Start: 2016-09-17 — End: 2016-10-03

## 2016-09-16 MED ORDER — FOLIC ACID 1 MG PO TABS: 1.0000 mg | ORAL_TABLET | Freq: Every day | ORAL | 0 refills | Status: DC

## 2016-09-16 MED ORDER — MAGNESIUM OXIDE 400 (241.3 MG) MG PO TABS
400.0000 mg | ORAL_TABLET | Freq: Every day | ORAL | Status: DC
  Administered 2016-09-16: 400 mg via ORAL
  Filled 2016-09-16: qty 1

## 2016-09-16 MED ORDER — MAGNESIUM OXIDE 400 (241.3 MG) MG PO TABS: 400.0000 mg | ORAL_TABLET | Freq: Every day | ORAL | 0 refills | Status: DC

## 2016-09-16 MED ORDER — POTASSIUM CHLORIDE CRYS ER 20 MEQ PO TBCR
40.0000 meq | EXTENDED_RELEASE_TABLET | ORAL | Status: AC
  Administered 2016-09-16 (×2): 40 meq via ORAL
  Filled 2016-09-16 (×2): qty 2

## 2016-09-16 MED ORDER — THIAMINE HCL 100 MG PO TABS: 100.0000 mg | ORAL_TABLET | Freq: Every day | ORAL | 0 refills | Status: DC

## 2016-09-16 NOTE — Progress Notes (Signed)
CM received consult: No pmd, need pmd and cardiology. CM will f/u with pt in am 09/17/2016 with post hospital f/u appointment with [facility]. Pt 's contact # [phone]. Gae Gallop RN,BSN,CM

## 2016-09-16 NOTE — Progress Notes (Signed)
Ronnie [name] to be D/C'd Home per MD order.  Discussed with the patient and all questions fully answered.  VSS, Skin clean, dry and intact without evidence of skin break down, no evidence of skin tears noted.  An After Visit Summary was printed and given to the patient. Patient received prescription.  D/c education completed with patient/family including follow up instructions, medication list, d/c activities limitations if indicated, with other d/c instructions as indicated by MD - patient able to verbalize understanding, all questions fully answered.   Patient instructed to return to ED, call 911, or call MD for any changes in condition.   Patient refused to be escorted and D/C'd home via private auto.  Ronnie [name] Ronnie [name] 09/16/2016 6:06 PM

## 2016-09-16 NOTE — Progress Notes (Signed)
Physical Therapy Treatment Patient Details Name: Ronnie [name] MRN: [id number] DOB: July 02, 1975 Today's Date: 09/16/2016    History of Present Illness patient is a 41 yo male who presents with acute hypoxic/hypercapneic respiratory failure, failure to protect airway - 2/2 heavy EtOH use and likely aspiration pneumonia    PT Comments    Patient mobilizing well, continues to show some instability without can, much improved with use of SPC. Patient performed stair negotiation with cane, but did endorse some increased fatigue. Anticipate patient will be safe for d/c home with use of SPC.    Follow Up Recommendations  No PT follow up;Supervision for mobility/OOB     Equipment Recommendations  Cane    Recommendations for Other Services       Precautions / Restrictions Precautions Precautions: Fall Restrictions Weight Bearing Restrictions: No    Mobility  Bed Mobility Overal bed mobility: Modified Independent             General bed mobility comments: increased time to perform, no physical assist required  Transfers Overall transfer level: Needs assistance Equipment used: Straight cane Transfers: Sit to/from Stand Sit to Stand: Modified independent (Device/Increase time)         General transfer comment: no physical assist required  Ambulation/Gait Ambulation/Gait assistance: Modified independent (Device/Increase time) Ambulation Distance (Feet): 310 Feet Assistive device: Straight cane Gait Pattern/deviations: Step-through pattern;Decreased stride length;Antalgic Gait velocity: decreased Gait velocity interpretation: Below normal speed for age/gender General Gait Details: continues to demonstrate modest instability during ambulation which improves with use of SPC   Stairs Stairs: Yes   Stair Management: One rail Left;Step to pattern;With cane Number of Stairs: 12    Wheelchair Mobility    Modified Rankin (Stroke Patients Only)       Balance Overall  balance assessment: Needs assistance Sitting-balance support: Feet supported Sitting balance-Leahy Scale: Good     Standing balance support: No upper extremity supported Standing balance-Leahy Scale: Fair                              Cognition Arousal/Alertness: Awake/alert Behavior During Therapy: WFL for tasks assessed/performed Overall Cognitive Status: Within Functional Limits for tasks assessed                                        Exercises      General Comments        Pertinent Vitals/Pain Pain Assessment: Faces Faces Pain Scale: Hurts a little bit Pain Location: LLE Pain Descriptors / Indicators: Sore Pain Intervention(s): Monitored during session    Home Living                      Prior Function            PT Goals (current goals can now be found in the care plan section) Acute Rehab PT Goals Patient Stated Goal: to go home PT Goal Formulation: With patient Time For Goal Achievement: 09/27/16 Potential to Achieve Goals: Good Progress towards PT goals: Progressing toward goals    Frequency    Min 3X/week      PT Plan Current plan remains appropriate    Co-evaluation             End of Session Equipment Utilized During Treatment: Gait belt Activity Tolerance: Patient tolerated treatment well Patient left: in bed;with call bell/phone  within reach Nurse Communication: Mobility status PT Visit Diagnosis: Unsteadiness on feet (R26.81)     Time: 1034-1050 PT Time Calculation (min) (ACUTE ONLY): 16 min  Charges:  $Gait Training: 8-22 mins                    G Codes:       Charlotte Crumb, PT DPT  [phone]    [name] 09/16/2016, 1:32 PM

## 2016-09-16 NOTE — Discharge Summary (Signed)
Discharge Summary  Ronnie [name] ZOX:[id number] DOB: [birth year]  PCP: No PCP Per Patient  Admit date: 09/11/2016 Discharge date: 09/20/2016  Time spent: >62mins, more than 50% time spent on coordination of care and patient's education.counseling    Recommendations for Outpatient Follow-up:  1. F/u with PMD within a week  for hospital discharge follow up, repeat cbc/bmp at follow up  2. f/u with cardiology for cardiomyopathy  Discharge Diagnoses:  Active Hospital Problems   Diagnosis Date Noted  . Ventilator dependent (HCC)   . Encounter for orogastric (OG) tube placement   . Altered mental state 09/11/2016  . Acute kidney injury (HCC) 09/11/2016  . Acute respiratory failure (HCC) 09/11/2016  . Lactic acidosis 09/11/2016  . Smoker 09/11/2016  . ETOH abuse 09/11/2016  . Cocaine abuse 09/11/2016    Resolved Hospital Problems   Diagnosis Date Noted Date Resolved  No resolved problems to display.    Discharge Condition: stable  Diet recommendation: heart healthy  Filed Weights   09/14/16 0545 09/15/16 0500 09/16/16 0536  Weight: 67.6 kg (149 lb 0.5 oz) 68.2 kg (150 lb 4.8 oz) 68.5 kg (151 lb)    History of present illness:  Admission date: 09/11/2016 Referring provider: Adela Lank, ER  CC: Alerted mental status  HPI: 41 yo male went on drinking binge with his friends.  Apparently does cocaine from time to time also.  Girlfriend found him asleep in his car around 5 am.  She left him there since she couldn't get him out of car.  She was getting her god child ready for school and Ronnie [name] was still in car.  She called her brother and they got Ronnie [name] in the house.  After several more hours he still wasn't waking up, so he was brought to the hospital.  He was intubated for airway protection.  He then required sedation and paralytic because he started moving.  He was noted to have Lt lung infiltrate, lactic acidosis, and UDS positive for cocaine.  No significant past medical  history or past surgical history.  No known allergies.  He does not take medications.  Girlfriend unable to provide family history.  He smokes cigarettes, does cocaine from time to time, and binge drinks alcohol on weekends.  Unable to obtain ROS.  Hospital Course:  Active Problems:   Altered mental state   Acute kidney injury (HCC)   Acute respiratory failure (HCC)   Lactic acidosis   Smoker   ETOH abuse   Cocaine abuse   Ventilator dependent (HCC)   Encounter for orogastric (OG) tube placement  Acute hypoxic/hypercapneic respiratory failure, failure to protect airway - 2/2 heavy EtOH use and likely aspiration pneumonia -extubated. Stable. He was unasyn for 5days, changed to augmentin on 4/23 with clinical improvement, he is discharged on augmentin to finish treatment course.  Pneumonia. CXR: Patchy interstitial and airspace disease throughout the left mid to lower lung, concerning for bronchopneumonia. Blood cultures: NGTD. Sputum+strep. improving on iv antibiotics. Transition to augmentin on 4/23  Repeat cxr two view on 4/24, no acute findings  Hypotension on admission likely due to pneumonia, dehydration, acute renal failure. 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 Treating infection, iv fluids. Echo: LVEF 35-40% . Diffuse hypokinesis., cont Asa, Avoid BB for unopposed alpha. Counseled to stop using cocaine. patient expressed understanding about the Needs to repeat echo, outpatient cardiology work up.   Mild elevated trop.  0.37-0.57-0.19 Denies chest pain, Probable cocaine induced. No acute chest pains. echo; ef-35-40%. Diffusehypokinesis.. On aspirin.  Avoid Betablocker  For now until he can completely stop cocaine use. He is dry to euvolemic on exam Need outpatient cardiology follow up  AKI on admission with hyperkalemia, dehydration.  -cr 2.4 on admission, cr 1.01 on 4/12, off iv fluids on 4/22. UA was not obtained on admission, he does  not have any urinary symptoms  Acute encephalopathy in the setting of substance abuse, alcohol overdose, infection, hypoxia -mental status->improved, at baseline     HTN. Started on amlodipin/ASA.  lft elevation with h/o alcohol use, denies abdominal pain, no n/v, hepatitis panel negative. Liver US "1. Small amount of sludge in the gallbladder. No gallstones. No biliary distention. Negative Murphy sign. 2.  Liver appears normal."  Substance abuse:  + cocaine, alcohol, cigarette  Cessation education provided, patient seems to be receptive  Headache/left foot numbness:  Repeat ct head no acute findings He does seems to have dark toes on exam with good pedal pulse, he denies pain, but report being numb there is no bacteremia, echo no vegetation,  ABI wnl ( long time smoker) On further question, patient has been having foot numbness for a while, possible related to alcohol use.    Code Status: full Family Communication: d/w patient Disposition Plan: home on 4/24   Consultants:  Patient is admitted to Critical care on 4/19 , transferred to hospitalist care on 4/21  LINES / TUBES: ETT 4/19 >>4/20  CULTURES: Blood culture 4/19 >>no growth Sputum 4/19 >> strep pneumo  ANTIBIOTICS: unasyn 4/19 >>4/23 augmentin from 4/23  SIGNIFICANT EVENTS:  4/19 Admit to icu , intubated, extubated on 4/20 Transferred to hospital medicine on 4/21   Discharge Exam: BP 117/73   Pulse 82   Temp 98.9 F (37.2 C) (Oral)   Resp 16   Ht 5\' 8"  (1.727 m)   Wt 68.5 kg (151 lb)   SpO2 99%   BMI 22.96 kg/m   General: NAD Cardiovascular: RRR Respiratory: CTABL  Discharge Instructions You were cared for by a hospitalist during your hospital stay. If you have any questions about your discharge medications or the care you received while you were in the hospital after you are discharged, you can call the unit and asked to speak with the hospitalist on call if the hospitalist that  took care of you is not available. Once you are discharged, your primary care physician will handle any further medical issues. Please note that NO REFILLS for any discharge medications will be authorized once you are discharged, as it is imperative that you return to your primary care physician (or establish a relationship with a primary care physician if you do not have one) for your aftercare needs so that they can reassess your need for medications and monitor your lab values.  Discharge Instructions    Diet - low sodium heart healthy    Complete by:  As directed    Increase activity slowly    Complete by:  As directed      Allergies as of 09/16/2016   No Known Allergies     Medication List    TAKE these medications   amLODipine 5 MG tablet Commonly known as:  NORVASC Take 1 tablet (5 mg total) by mouth daily.   aspirin 81 MG chewable tablet Chew 1 tablet (81 mg total) by mouth daily.   folic acid 1 MG tablet Commonly known as:  FOLVITE Take 1 tablet (1 mg total) by mouth daily.   magnesium oxide 400 (241.3 Mg) MG tablet Commonly  known as:  MAG-OX Take 1 tablet (400 mg total) by mouth daily.   thiamine 100 MG tablet Take 1 tablet (100 mg total) by mouth daily.     ASK your doctor about these medications   amoxicillin-clavulanate 875-125 MG tablet Commonly known as:  AUGMENTIN Take 1 tablet by mouth every 12 (twelve) hours. Ask about: Should I take this medication?      No Known Allergies Follow-up Information    [facility] [address] [facility] Follow up in 1 month(s).   Specialty:  Cardiology Why:  cardiomyopathy (reduced ejection fraction and elevation of heart enzymes) Contact information: [address] [place] [address] [phone]       follow up with pmd in one week for hospital discharge follow up Follow up in 1 week(s).   Why:  for hospital discharge follow up, please check your blood pressure twice a day at home and  bring in blood pressure record to your primary care doctor, please follow up on final hepatitis testing result at hospital discharge follow up.       [facility] Follow up.            The results of significant diagnostics from this hospitalization (including imaging, microbiology, ancillary and laboratory) are listed below for reference.    Significant Diagnostic Studies: Dg Chest 2 View  Result Date: 09/16/2016 CLINICAL DATA:  Pneumonia, cough, chills EXAM: CHEST  2 VIEW COMPARISON:  09/12/2016 FINDINGS: Heart and mediastinal contours are within normal limits. No focal opacities or effusions. No acute bony abnormality. IMPRESSION: No active cardiopulmonary disease. Electronically Signed   By: Charlett Nose M.D.   On: 09/16/2016 12:43   Ct Head Wo Contrast  Result Date: 09/15/2016 CLINICAL DATA:  LEFT-sided weakness. Headaches. History of substance abuse. EXAM: CT HEAD WITHOUT CONTRAST TECHNIQUE: Contiguous axial images were obtained from the base of the skull through the vertex without intravenous contrast. COMPARISON:  CT HEAD September 11, 2016 FINDINGS: BRAIN: No intraparenchymal hemorrhage, mass effect nor midline shift. The ventricles and sulci are normal. No acute large vascular territory infarcts. No abnormal extra-axial fluid collections. Basal cisterns are patent. VASCULAR: Unremarkable. SKULL/SOFT TISSUES: No skull fracture. No significant soft tissue swelling. ORBITS/SINUSES: The included ocular globes and orbital contents are normal mild paranasal sinus mucosal thickening without air-fluid levels. Mastoid air cells are well aerated. OTHER: None. IMPRESSION: Stable examination: Normal CT HEAD. Electronically Signed   By: Awilda Metro M.D.   On: 09/15/2016 19:19   Ct Head Wo Contrast  Result Date: 09/11/2016 CLINICAL DATA:  Altered mental status.  Possible drug overdose. EXAM: CT HEAD WITHOUT CONTRAST TECHNIQUE: Contiguous axial images were obtained from the base of the  skull through the vertex without intravenous contrast. COMPARISON:  None. FINDINGS: Brain: No mass lesion, intraparenchymal hemorrhage or extra-axial collection. No evidence of acute cortical infarct. Brain parenchyma and CSF-containing spaces are normal for age. Vascular: No hyperdense vessel or unexpected calcification. Skull: Normal visualized skull base, calvarium and extracranial soft tissues. Sinuses/Orbits: No sinus fluid levels or advanced mucosal thickening. No mastoid effusion. Normal orbits. IMPRESSION: Normal head CT. Electronically Signed   By: Deatra Robinson M.D.   On: 09/11/2016 16:00   US Abdomen Limited  Result Date: 09/16/2016 CLINICAL DATA:  Elevated LFTs. EXAM: US ABDOMEN LIMITED - RIGHT UPPER QUADRANT COMPARISON:  KUB 09/11/2016. FINDINGS: Gallbladder: Small amount sludge in the gallbladder. Gallbladder wall thickness normal. Negative Murphy sign. Common bile duct: Diameter: 4.2 mm Liver: Normal echogenicity.  No focal hepatic abnormality. Portal vein patent. IMPRESSION: 1. Small amount of sludge in the gallbladder. No gallstones. No biliary distention. Negative Murphy sign. 2.  Liver appears normal. Electronically Signed   By: Maisie Fus  Register   On: 09/16/2016 09:55   Dg Chest Port 1 View  Result Date: 09/12/2016 CLINICAL DATA:  Vent dependent EXAM: PORTABLE CHEST 1 VIEW COMPARISON:  09/11/2016 FINDINGS: Mild patchy lingular/ left lower lobe opacity, atelectasis versus pneumonia. Right lung is essentially clear. No pleural effusion or pneumothorax. The heart is normal in size. Endotracheal tube terminates 2.5 cm above the carina. Enteric tube courses into the mid stomach. IMPRESSION: Endotracheal tube terminates 2.5 cm above the carina. Mild patchy lingular/ left lower lobe opacity, atelectasis versus pneumonia. Electronically Signed   By: Charline Bills M.D.   On: 09/12/2016 07:08   Dg Chest Portable 1 View  Result Date: 09/11/2016 CLINICAL DATA:  41 year old male status post  intubation. EXAM: PORTABLE CHEST 1 VIEW COMPARISON:  No priors. FINDINGS: An endotracheal tube is in place with tip 1.7 cm above the carina. A nasogastric tube is seen extending into the stomach, however, the tip of the nasogastric tube extends below the lower margin of the image. Lung volumes are low. Patchy multifocal interstitial and airspace disease throughout the left mid to lower lung, concerning for pneumonia. No pleural effusions. No pneumothorax. No evidence of pulmonary edema. Heart size is normal. Upper mediastinal contours are within normal limits. IMPRESSION: 1. Support apparatus, as above. Endotracheal tube is slightly low lying, and could be withdrawn 3.3 cm for more optimal placement. 2. Patchy interstitial and airspace disease throughout the left mid to lower lung, concerning for bronchopneumonia. Followup PA and lateral chest X-ray is recommended in 3-4 weeks following trial of antibiotic therapy to ensure resolution and exclude underlying malignancy. Electronically Signed   By: Trudie Reed M.D.   On: 09/11/2016 16:36   Dg Abd Portable 1v  Result Date: 09/11/2016 CLINICAL DATA:  Orogastric tube placement EXAM: PORTABLE ABDOMEN - 1 VIEW COMPARISON:  None. FINDINGS: Orogastric tube tip and side port overlie the gastric body. The bowel gas pattern is normal. No radio-opaque calculi or other significant radiographic abnormality are seen. IMPRESSION: Orogastric tube tip and side port overlie the gastric body. Electronically Signed   By: Deatra Robinson M.D.   On: 09/11/2016 21:49    Microbiology: Recent Results (from the past 240 hour(s))  Culture, blood (routine x 2)     Status: None   Collection Time: 09/11/16  5:40 PM  Result Value Ref Range Status   Specimen Description BLOOD RIGHT ANTECUBITAL  Final   Special Requests   Final    BOTTLES DRAWN AEROBIC ONLY Blood Culture adequate volume   Culture NO GROWTH 5 DAYS  Final   Report Status 09/16/2016 FINAL  Final  Culture, blood  (routine x 2)     Status: None   Collection Time: 09/11/16  5:45 PM  Result Value Ref Range Status   Specimen Description BLOOD RIGHT HAND  Final   Special Requests   Final    BOTTLES DRAWN AEROBIC ONLY Blood Culture adequate volume   Culture NO GROWTH 5 DAYS  Final   Report Status 09/16/2016 FINAL  Final  MRSA PCR Screening     Status: None   Collection Time: 09/11/16  6:48 PM  Result Value Ref Range Status   MRSA by PCR NEGATIVE NEGATIVE Final    Comment:        The GeneXpert MRSA Assay (  FDA approved for NASAL specimens only), is one component of a comprehensive MRSA colonization surveillance program. It is not intended to diagnose MRSA infection nor to guide or monitor treatment for MRSA infections.   Culture, respiratory (NON-Expectorated)     Status: None   Collection Time: 09/11/16  6:54 PM  Result Value Ref Range Status   Specimen Description TRACHEAL ASPIRATE  Final   Special Requests NONE  Final   Gram Stain   Final    ABUNDANT WBC PRESENT,BOTH PMN AND MONONUCLEAR ABUNDANT GRAM POSITIVE COCCI IN PAIRS MODERATE GRAM NEGATIVE RODS MODERATE GRAM NEGATIVE COCCI IN PAIRS    Culture ABUNDANT STREPTOCOCCUS PNEUMONIAE  Final   Report Status 09/14/2016 FINAL  Final   Organism ID, Bacteria STREPTOCOCCUS PNEUMONIAE  Final      Susceptibility   Streptococcus pneumoniae - MIC*    ERYTHROMYCIN <=0.12 SENSITIVE Sensitive     LEVOFLOXACIN 0.5 SENSITIVE Sensitive     PENICILLIN (meningitis) <=0.06 SENSITIVE Sensitive     PENICILLIN (non-meningitis) <=0.06 SENSITIVE Sensitive     CEFTRIAXONE (non-meningitis) <=0.12 SENSITIVE Sensitive     CEFTRIAXONE (meningitis) <=0.12 SENSITIVE Sensitive     * ABUNDANT STREPTOCOCCUS PNEUMONIAE     Labs: Basic Metabolic Panel:  Recent Labs Lab 09/16/16 0328  NA 141  K 2.8*  CL 101  CO2 30  GLUCOSE 98  BUN 6  CREATININE 0.96  CALCIUM 9.2   Liver Function Tests:  Recent Labs Lab 09/16/16 0328  AST 41  ALT 32  ALKPHOS 122    BILITOT 0.7  PROT 6.9  ALBUMIN 3.2*   No results for input(s): LIPASE, AMYLASE in the last 168 hours. No results for input(s): AMMONIA in the last 168 hours. CBC:  Recent Labs Lab 09/16/16 0328  WBC 10.2  NEUTROABS 6.6  HGB 12.7*  HCT 37.4*  MCV 91.0  PLT 202   Cardiac Enzymes: No results for input(s): CKTOTAL, CKMB, CKMBINDEX, TROPONINI in the last 168 hours. BNP: BNP (last 3 results) No results for input(s): BNP in the last 8760 hours.  ProBNP (last 3 results) No results for input(s): PROBNP in the last 8760 hours.  CBG:  Recent Labs Lab 09/13/16 2012 09/14/16 0030 09/14/16 0353 09/14/16 0800 09/14/16 1144  GLUCAP 82 85 78 84 114*       Signed:  Samamtha Tiegs MD, PhD  [facility] 09/20/2016, 12:09 PM

## 2016-09-16 NOTE — Progress Notes (Signed)
VASCULAR LAB PRELIMINARY  ARTERIAL  ABI completed:    RIGHT    LEFT    PRESSURE WAVEFORM  PRESSURE WAVEFORM  BRACHIAL 144 Triphasic BRACHIAL 142 Triphasic  DP 158 Triphasic DP 153 Triphasic  PT 152 Triphasic PT 150 Triphasic    RIGHT LEFT  ABI 1.10 1.06   ABIs and Doppler waveforms indicate normal arterial flow bilaterally at rest  Justis Dupas, RVS 09/16/2016, 3:55 PM

## 2016-09-17 LAB — HEPATITIS PANEL, ACUTE
HCV Ab: <0.1 s/co ratio (ref 0.0–0.9)
HEP B S AG: NEGATIVE
Hep A IgM: NEGATIVE
Hep B C IgM: NEGATIVE

## 2016-09-17 NOTE — Progress Notes (Signed)
Pt's post hospital follow up scheduled for May 11th , 2018 @ 1pm with Jeanine Luz NP. At visit referral for cardiologist will be established. CM made pt aware. Gae Gallop RN,BSN,CM

## 2016-10-01 ENCOUNTER — Ambulatory Visit (HOSPITAL_COMMUNITY)
Admission: EM | Admit: 2016-10-01 | Discharge: 2016-10-01 | Disposition: A | Discharge status: Home / Self Care | Payer: BLUE CROSS/BLUE SHIELD | Attending: Internal Medicine | Admitting: Internal Medicine

## 2016-10-01 ENCOUNTER — Encounter (HOSPITAL_COMMUNITY): Payer: Self-pay | Admitting: Emergency Medicine

## 2016-10-01 DIAGNOSIS — R202 Paresthesia of skin: Secondary | ICD-10-CM

## 2016-10-01 HISTORY — DX: Essential (primary) hypertension: I10

## 2016-10-01 NOTE — ED Provider Notes (Signed)
[facility]    CSN: [id number] Arrival date & time: 10/01/16  1538     History   Chief Complaint Chief Complaint  Patient presents with  . Numbness    HPI Ronnie [name] is a 41 y.o. male. He presents today with paresthesia of the left distal lower extremity. He was hospitalized in late April with a severe illness, in the intensive care unit on the ventilator with renal injury. He first noticed the tingling discomfort in his left toes at that time. He was started on a folate supplement. Several days after discharge from the hospital, he stopped taking the folate. Today he awakened with extension of the tingling discomfort from the toes up the leg to the knee. 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 Felt like he slept okay but woke up with increased discomfort. Able to walk without difficulty, although walking does seem to aggravate the discomfort.    HPI  Past Medical History:  Diagnosis Date  . Hypertension   . Substance abuse     Patient Active Problem List   Diagnosis Date Noted  . Ventilator dependent (HCC)   . Encounter for orogastric (OG) tube placement   . Altered mental state 09/11/2016  . Acute kidney injury (HCC) 09/11/2016  . Acute respiratory failure (HCC) 09/11/2016  . Lactic acidosis 09/11/2016  . Smoker 09/11/2016  . ETOH abuse 09/11/2016  . Cocaine abuse 09/11/2016    Past Surgical History:  Procedure Laterality Date  . OTHER SURGICAL HISTORY     No surgical history        Home Medications    Prior to Admission medications   Medication Sig Start Date End Date Taking? Authorizing Provider  amLODipine (NORVASC) 5 MG tablet Take 1 tablet (5 mg total) by mouth daily. 09/17/16  Yes Albertine Grates, MD  aspirin 81 MG chewable tablet Chew 1 tablet (81 mg total) by mouth daily. 09/17/16  Yes Albertine Grates, MD  folic acid (FOLVITE) 1 MG tablet Take 1 tablet (1 mg total) by mouth daily. 09/17/16  Yes Albertine Grates, MD    magnesium oxide (MAG-OX) 400 (241.3 Mg) MG tablet Take 1 tablet (400 mg total) by mouth daily. 09/17/16  Yes Albertine Grates, MD  thiamine 100 MG tablet Take 1 tablet (100 mg total) by mouth daily. 09/17/16  Yes Albertine Grates, MD    Family History Family History  Problem Relation Age of Onset  . Family history unknown: Yes    Social History Social History  Substance Use Topics  . Smoking status: Current Every Day Smoker    Packs/day: 0.50    Types: Cigarettes  . Smokeless tobacco: Never Used  . Alcohol use 3.6 oz/week    6 Cans of beer per week     Allergies   Patient has no known allergies.   Review of Systems Review of Systems  All other systems reviewed and are negative.    Physical Exam Triage Vital Signs ED Triage Vitals [10/01/16 1617]  Enc Vitals Group     BP (!) 153/97     Pulse Rate 79     Resp 18     Temp 98.6 F (37 C)     Temp Source Oral     SpO2 100 %     Weight      Height      Pain Score      Pain Loc    Updated Vital Signs BP Marland Kitchen)  153/97 (BP Location: Right Arm)   Pulse 79   Temp 98.6 F (37 C) (Oral)   Resp 18   SpO2 100%   Physical Exam  Constitutional: He is oriented to person, place, and time. No distress.  Alert, nicely groomed  HENT:  Head: Atraumatic.  Eyes:  Conjugate gaze, no eye redness/drainage  Neck: Neck supple.  Cardiovascular: Normal rate.   Pulmonary/Chest: No respiratory distress.  Abdominal: He exhibits no distension.  Musculoskeletal: Normal range of motion.  Left lower extremity is warm, no swelling. Able to wiggle his toes and move his ankle through full range of motion. Knee range of motion is also full. No rash or skin lesion. Gait is steady, no foot drop. Sensation to light touch is intact.  Neurological: He is alert and oriented to person, place, and time.  Skin: Skin is warm and dry.  No cyanosis  Nursing note and vitals reviewed.    UC Treatments / Results   Procedures Procedures (including critical care  time) None today  Final Clinical Impressions(s) / UC Diagnoses   Final diagnoses:  Paresthesia   Take daily multi vitamin.  Take folic acid/folate until it is gone.  Hopefully will help with tingling in left leg.  Let your primary care provider know how the leg is doing at checkup on Friday 5/11.  Don't get in the situation where you are unconscious that you might be laying on a body part and pressing it too hard so that it has nerve damage.      Eustace Moore, MD 10/05/16 [phone]

## 2016-10-01 NOTE — ED Triage Notes (Addendum)
Patient was in hospital in April 2018 for pneumonia.  Reports toes on left foot went numb at that time.  Today additional numbness/tingling occurred today.  Numbness below left knee noted today.   Intense prickly feeling to foot.  No temp change between lower extremities, no swelling, no injury.  Pulse in left foot 2 plus.  Able to move toes and extremity.  Spoke to dr Dayton Scrape.  Notify of wait and a provider at [facility] will see hime

## 2016-10-01 NOTE — Discharge Instructions (Addendum)
Take daily multi vitamin.  Take folic acid/folate until it is gone.  Hopefully will help with tingling in left leg.  Let your primary care provider know how the leg is doing at checkup on Friday 5/11.  Don't get in the situation where you are unconscious that you might be laying on a body part and pressing it too hard so that it has nerve damage.

## 2016-10-03 ENCOUNTER — Ambulatory Visit (INDEPENDENT_AMBULATORY_CARE_PROVIDER_SITE_OTHER): Payer: BLUE CROSS/BLUE SHIELD | Admitting: Family

## 2016-10-03 ENCOUNTER — Other Ambulatory Visit (INDEPENDENT_AMBULATORY_CARE_PROVIDER_SITE_OTHER): Payer: BLUE CROSS/BLUE SHIELD

## 2016-10-03 ENCOUNTER — Encounter: Payer: Self-pay | Admitting: Family

## 2016-10-03 VITALS — BP 124/86 | HR 95 | Temp 98.3°F | Resp 18 | Ht 68.0 in | Wt 148.8 lb

## 2016-10-03 DIAGNOSIS — F17200 Nicotine dependence, unspecified, uncomplicated: Secondary | ICD-10-CM

## 2016-10-03 DIAGNOSIS — I10 Essential (primary) hypertension: Secondary | ICD-10-CM | POA: Diagnosis not present

## 2016-10-03 DIAGNOSIS — R931 Abnormal findings on diagnostic imaging of heart and coronary circulation: Secondary | ICD-10-CM | POA: Diagnosis not present

## 2016-10-03 DIAGNOSIS — F1410 Cocaine abuse, uncomplicated: Secondary | ICD-10-CM

## 2016-10-03 DIAGNOSIS — I428 Other cardiomyopathies: Secondary | ICD-10-CM | POA: Insufficient documentation

## 2016-10-03 LAB — COMPREHENSIVE METABOLIC PANEL
ALK PHOS: 112 U/L (ref 39–117)
ALT: 16 U/L (ref 0–53)
AST: 20 U/L (ref 0–37)
Albumin: 4.5 g/dL (ref 3.5–5.2)
BILIRUBIN TOTAL: 0.5 mg/dL (ref 0.2–1.2)
BUN: 7 mg/dL (ref 6–23)
CO2: 26 mEq/L (ref 19–32)
CREATININE: 1.17 mg/dL (ref 0.40–1.50)
Calcium: 9.6 mg/dL (ref 8.4–10.5)
Chloride: 107 mEq/L (ref 96–112)
GFR: 87.73 mL/min (ref >60.00)
GLUCOSE: 74 mg/dL (ref 70–99)
Potassium: 3.8 mEq/L (ref 3.5–5.1)
Sodium: 141 mEq/L (ref 135–145)
TOTAL PROTEIN: 7.8 g/dL (ref 6.0–8.3)

## 2016-10-03 LAB — CBC
HCT: 38.6 % — ABNORMAL LOW (ref 39.0–52.0)
HEMOGLOBIN: 13 g/dL (ref 13.0–17.0)
MCHC: 33.8 g/dL (ref 30.0–36.0)
MCV: 93.2 fl (ref 78.0–100.0)
Platelets: 273 10*3/uL (ref 150.0–400.0)
RBC: 4.14 Mil/uL — ABNORMAL LOW (ref 4.22–5.81)
RDW: 14.2 % (ref 11.5–15.5)
WBC: 6.7 10*3/uL (ref 4.0–10.5)

## 2016-10-03 MED ORDER — AMLODIPINE BESYLATE 5 MG PO TABS: 5.0000 mg | ORAL_TABLET | Freq: Every day | ORAL | 0 refills | Status: DC

## 2016-10-03 NOTE — Progress Notes (Signed)
Subjective:    Patient ID: Ronnie [name], male    DOB: [birth year], 41 y.o.   MRN: [id number]  Chief Complaint  Patient presents with  . Establish Care    ED follow up , had pneumonia and heart issues    HPI:  Ronnie [name] is a 41 y.o. male who  has a past medical history of Hypertension and Substance abuse. and presents today for an office visit to establish care.  Recently evaluated in the ED and admitted To the hospital after being found in his car unresponsive and was noted to be drinking the night prior. His physical exam was noted to have tachycardia and a GCS of 8 with his eyes open but not responding to voice. His potassium was noted to be 5.5 with a lactic acid level of 4.77 serum creatinine of 2.3. EKG showed sinus tachycardia with probable left ventricular hypertrophy. There is no previous change from previous EKG. CT scan of the head was normal. He was intubated for airway protection. He was also noted to have apneic episodes for 20 second intervals and O2 saturations were in the mid-80s. During hospitalization he required sedation and paralytic because he started ventilator asynchrony. He was noted to have left lung infiltrate, lactic acidosis and urine drug screen positive for cocaine. There is concern for aspiration pneumonia. He was successfully extubated and started on Unasyn for 5 days which was changed to Augmentin upon discharge. 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 His troponins were slightly elevated and most likely cocaine induced with an ejection fraction of 35-40% and diffuse hypokinesis. Acute kidney injury with creatinine of 2.4 improved to 1.01 prior to discharge. He was started on amlodipine for hypertension at discharge. LFTs were noted to be elevated with history of alcohol use with a liver ultrasound showing small amount of sludge in the gallbladder no  gallstones. Liver appeared normal. Hospitalist recommends follow-up with primary care and obtaining a CBC and basic metabolic profile. Also to follow-up with cardiology for cardiomyopathy. All hospital records, labs, and imaging reviewed in detail.  Since leaving the hospital he reports that his body feels like it has not been the same and describes feeling more fatigue. Has returned to work and able to complete his job functions. Restarted taking the amlodipine this morning with no adverse side effects. Not currently checking blood pressure at home. Denies any headaches, chest pain, or heart palpitations. No urinary symptoms. Endorses eating okay. Denies any additional cocaine usage. Needing to referral to cardiology.    No Known Allergies    Outpatient Medications Prior to Visit  Medication Sig Dispense Refill  . folic acid (FOLVITE) 1 MG tablet Take 1 tablet (1 mg total) by mouth daily. 30 tablet 0  . amLODipine (NORVASC) 5 MG tablet Take 1 tablet (5 mg total) by mouth daily. 30 tablet 0  . aspirin 81 MG chewable tablet Chew 1 tablet (81 mg total) by mouth daily. 30 tablet 0  . magnesium oxide (MAG-OX) 400 (241.3 Mg) MG tablet Take 1 tablet (400 mg total) by mouth daily. 30 tablet 0  . thiamine 100 MG tablet Take 1 tablet (100 mg total) by mouth daily. 30 tablet 0   No facility-administered medications prior to visit.       Past Surgical History:  Procedure Laterality Date  . OTHER SURGICAL HISTORY     No surgical history       Past  Medical History:  Diagnosis Date  . Hypertension   . Substance abuse       Review of Systems  Constitutional: Positive for fever. Negative for chills.  Eyes:       Negative for changes in vision  Respiratory: Negative for cough, chest tightness and wheezing.   Cardiovascular: Negative for chest pain, palpitations and leg swelling.  Neurological: Negative for dizziness, seizures, weakness and light-headedness.      Objective:    BP  124/86 (BP Location: Left Arm, Patient Position: Sitting, Cuff Size: Normal)   Pulse 95   Temp 98.3 F (36.8 C) (Oral)   Resp 18   Ht 5\' 8"  (1.727 m)   Wt 148 lb 12.8 oz (67.5 kg)   SpO2 97%   BMI 22.62 kg/m  Nursing note and vital signs reviewed.  Physical Exam  Constitutional: He is oriented to person, place, and time. He appears well-developed and well-nourished. No distress.  Cardiovascular: Normal rate, regular rhythm, normal heart sounds and intact distal pulses.  Exam reveals no gallop and no friction rub.   No murmur heard. Pulmonary/Chest: Effort normal and breath sounds normal. No respiratory distress. He has no wheezes. He has no rales. He exhibits no tenderness.  Neurological: He is alert and oriented to person, place, and time.  Skin: Skin is warm and dry.  Psychiatric: He has a normal mood and affect. His behavior is normal. Judgment and thought content normal.       Assessment & Plan:   Problem List Items Addressed This Visit      Cardiovascular and Mediastinum   Decreased cardiac ejection fraction - Primary    Noted to have ejection fraction of 35-40% and diffuse hypokinesis on most recent 2-D echocardiogram. Consider addition of lisinopril for cardioprotection. Refer to cardiology for further assessment and repeat 2-D echocardiogram. Continue current dosage of amlodipine pending cardiology referral.      Relevant Medications   amLODipine (NORVASC) 5 MG tablet   Other Relevant Orders   Ambulatory referral to Cardiology   Essential hypertension    Blood pressure well controlled with medication regimen with less than optimal patient compliance with regimen. No adverse side effects. Continue current dosage of amlodipine. Monitor blood pressure at home and follow low sodium intake. Encouraged to monitor blood pressure at home and follow low-sodium diet. Continue to monitor.      Relevant Medications   amLODipine (NORVASC) 5 MG tablet   Other Relevant Orders    CBC (Completed)   Comprehensive metabolic panel (Completed)     Other   Smoker    Continues to smoke approximately 0.5 packs per day and is in the pre-contemplation stage of change. Encouraged to decrease tobacco consumption to reduce the risk for further cardiovascular, respiratory and malignant disease in the future. Continue to monitor.       Cocaine abuse    Indicates he has remained sober since leaving the hospital with no further cocaine usage. Emphasize importance of cocaine cessation to reduce risk for further cardiac damage and cardiovascular disease in future. Discussed resources to help with cocaine cessation. Continue to monitor.          I have discontinued Mr. Wisor aspirin, magnesium oxide, and thiamine. I am also having him maintain his folic acid and amLODipine.   Follow-up: Return in 1 month (on 11/03/2016), or if symptoms worsen or fail to improve.  Jeanine Luz, FNP

## 2016-10-03 NOTE — Assessment & Plan Note (Signed)
Noted to have ejection fraction of 35-40% and diffuse hypokinesis on most recent 2-D echocardiogram. Consider addition of lisinopril for cardioprotection. Refer to cardiology for further assessment and repeat 2-D echocardiogram. Continue current dosage of amlodipine pending cardiology referral.

## 2016-10-03 NOTE — Assessment & Plan Note (Signed)
Indicates he has remained sober since leaving the hospital with no further cocaine usage. Emphasize importance of cocaine cessation to reduce risk for further cardiac damage and cardiovascular disease in future. Discussed resources to help with cocaine cessation. Continue to monitor.

## 2016-10-03 NOTE — Assessment & Plan Note (Signed)
Blood pressure well controlled with medication regimen with less than optimal patient compliance with regimen. No adverse side effects. Continue current dosage of amlodipine. Monitor blood pressure at home and follow low sodium intake. Encouraged to monitor blood pressure at home and follow low-sodium diet. Continue to monitor.

## 2016-10-03 NOTE — Patient Instructions (Addendum)
Thank you for choosing [facility].  SUMMARY AND INSTRUCTIONS:  Please continue to take your medications as prescribed.   Monitor your blood pressure at home with an average goal of <140/90  They will call with your cardiology appointment.   Please schedule a time for your physical at your convenience.   Medication:  Your prescription(s) have been submitted to your pharmacy or been printed and provided for you. Please take as directed and contact our office if you believe you are having problem(s) with the medication(s) or have any questions.  Labs:  Please stop by the lab on the lower level of the building for your blood work. Your results will be released to MyChart (or called to you) after review, usually within 72 hours after test completion. If any changes need to be made, you will be notified at that same time.  1.) The lab is open from 7:30am to 5:30 pm Monday-Friday 2.) No appointment is necessary 3.) Fasting (if needed) is 6-8 hours after food and drink; black coffee and water are okay   Referrals:  Referrals have been made during this visit. You should expect to hear back from our schedulers in about 7-10 days in regards to establishing an appointment with the specialists we discussed.   Follow up:  If your symptoms worsen or fail to improve, please contact our office for further instruction, or in case of emergency go directly to the emergency room at the closest medical facility.     Hypertension Hypertension is another name for high blood pressure. High blood pressure forces your heart to work harder to pump blood. This can cause problems over time. There are two numbers in a blood pressure reading. There is a top number (systolic) over a bottom number (diastolic). It is best to have a blood pressure below 120/80. Healthy choices can help lower your blood pressure. You may need medicine to help lower your blood pressure if:  Your blood pressure cannot be  lowered with healthy choices.  Your blood pressure is higher than 130/80. Follow these instructions at home: Eating and drinking   If directed, follow the DASH eating plan. This diet includes:  Filling half of your plate at each meal with fruits and vegetables.  Filling one quarter of your plate at each meal with whole grains. Whole grains include whole wheat pasta, brown rice, and whole grain bread.  Eating or drinking low-fat dairy products, such as skim milk or low-fat yogurt.  Filling one quarter of your plate at each meal with low-fat (lean) proteins. Low-fat proteins include fish, skinless chicken, eggs, beans, and tofu.  Avoiding fatty meat, cured and processed meat, or chicken with skin.  Avoiding premade or processed food.  Eat less than 1,500 mg of salt (sodium) a day.  Limit alcohol use to no more than 1 drink a day for nonpregnant women and 2 drinks a day for men. One drink equals 12 oz of beer, 5 oz of wine, or 1 oz of hard liquor. Lifestyle   Work with your doctor to stay at a healthy weight or to lose weight. Ask your doctor what the best weight is for you.  Get at least 30 minutes of exercise that causes your heart to beat faster (aerobic exercise) most days of the week. This may include walking, swimming, or biking.  Get at least 30 minutes of exercise that strengthens your muscles (resistance exercise) at least 3 days a week. This may include lifting weights or pilates.  Do not use any products that contain nicotine or tobacco. This includes cigarettes and e-cigarettes. If you need help quitting, ask your doctor.  Check your blood pressure at home as told by your doctor.  Keep all follow-up visits as told by your doctor. This is important. Medicines   Take over-the-counter and prescription medicines only as told by your doctor. Follow directions carefully.  Do not skip doses of blood pressure medicine. The medicine does not work as well if you skip doses.  Skipping doses also puts you at risk for problems.  Ask your doctor about side effects or reactions to medicines that you should watch for. Contact a doctor if:  You think you are having a reaction to the medicine you are taking.  You have headaches that keep coming back (recurring).  You feel dizzy.  You have swelling in your ankles.  You have trouble with your vision. Get help right away if:  You get a very bad headache.  You start to feel confused.  You feel weak or numb.  You feel faint.  You get very bad pain in your:  Chest.  Belly (abdomen).  You throw up (vomit) more than once.  You have trouble breathing. Summary  Hypertension is another name for high blood pressure.  Making healthy choices can help lower blood pressure. If your blood pressure cannot be controlled with healthy choices, you may need to take medicine. This information is not intended to replace advice given to you by your health care provider. Make sure you discuss any questions you have with your health care provider. Document Released: 10/29/2007 Document Revised: 04/09/2016 Document Reviewed: 04/09/2016 Elsevier Interactive Patient Education  2017 [employer]   Stimulant Use Disorder-Cocaine Cocaine belongs to a group of powerful drugs known as stimulants. Common street names for cocaine include coke, crack, blow, snow, C, powder, and nose candy. Cocaine has some medical uses, but it is often misused because of the effects that it produces. These effects include:  A feeling of extreme pleasure (euphoria).  Alertness.  A high energy level. Stimulant use disorder is when your stimulant use disrupts your daily life. It may disrupt your relationships and how you do your job. Stimulant use disorder can be dangerous. Cocaine increases your blood pressure and heart rate. Using it can lead to a heart attack or stroke. Cocaine can also make your heart rate irregular and cause seizures. These  problems can lead to death. What are the causes? This condition is caused by misusing cocaine. Many people start using cocaine because it makes them feel good. Over time, they get addicted to it. When they try to stop using it, they feel sick. What increases the risk? This condition is more likely to develop in:  People who misuse other drugs.  People with a family history of misusing drugs. What are the signs or symptoms? Symptoms of this condition include:  Using greater amounts of cocaine than you want to, or using cocaine for longer than you want to.  Trying several times to use less cocaine or to control your cocaine use.  Craving cocaine.  Spending a lot of time getting cocaine, using it, or recovering from its effects.  Having problems at work, at school, at home, or with relationships because of cocaine use.  Giving up or cutting down on important life activities because of cocaine use.  Using cocaine when it is dangerous, such as when driving a car.  Continuing to use cocaine even though it  is causing or has led to a physical problem, such as:  Malnutrition.  Nosebleeds.  Chest pain.  High blood pressure.  A hole between the part of your nose that separates your nostrils (perforated nasal septum).  Lung and kidney damage.  Continuing to use cocaine even though it is causing a mental problem, such as:  Schizophrenia-like symptoms.  Depression.  Bipolar mood swings.  Anxiety.  Sleep problems.  Needing more and more cocaine to get the same effect that you want (building up a tolerance).  Having symptoms of withdrawal when you stop using cocaine. Symptoms of withdrawal include:  Depression.  Irritability.  Low energy.  Restlessness.  Bad dreams.  Too little or too much sleep.  Increased appetite. How is this diagnosed? This condition is diagnosed with an assessment. During the assessment, your health care provider will ask about your cocaine  use and about how it affects your life. Your health care provider may also:  Perform a physical exam or do lab tests to see if you have physical problems resulting from cocaine use.  Screen for drug use.  Refer you to a mental health professional for evaluation. How is this treated? Treatment for this condition is usually provided by mental health professionals with training in substance use disorders. Treatment may involve:  Counseling. This treatment is also called talk therapy. It is provided by substance use treatment counselors. A counselor can address the reasons you use cocaine and suggest ways to keep you from using it again. The goals of talk therapy are to:  Find healthy activities to replace using cocaine.  Identify and avoid what triggers your cocaine use.  Help you learn how to handle cravings.  Support groups. Support groups are run by people who have quit using stimulants. They provide emotional support, advice, and guidance.  Medicines. Follow these instructions at home:  Take over-the-counter and prescription medicines only as told by your health care provider.  Check with your health care provider before starting any new medicines.  Do not use any products that contain nicotine or tobacco, such as cigarettes and e-cigarettes. If you need help quitting, ask your health care provider.  Keep all follow-up visits as told by your health care provider. This is important. Where to find more information:  [employer] on Drug Abuse: http://www.price-smith.com/  Substance Abuse and Mental Health Services Administration: SkateOasis.com.pt Contact a health care provider if:  You are not able to take your medicines as told.  You use cocaine again.  Your symptoms get worse. Get help right away if:  You have serious thoughts about hurting yourself or others.  You have a seizure.  You have chest pain.  You have sudden weakness.  You lose some of your vision.  You  lose some of your speech. If you ever feel like you may hurt yourself or others, or have thoughts about taking your own life, get help right away. You can go to your nearest emergency department or call:  Your local emergency services (911 in the [place]  A suicide crisis helpline, such as the National Suicide Prevention Lifeline at (609)220-1129. This is open 24 hours a day. This information is not intended to replace advice given to you by your health care provider. Make sure you discuss any questions you have with your health care provider. Document Released: 05/09/2000 Document Revised: 02/22/2016 Document Reviewed: 02/22/2016 Elsevier Interactive Patient Education  2017 [employer].

## 2016-10-03 NOTE — Assessment & Plan Note (Signed)
Continues to smoke approximately 0.5 packs per day and is in the pre-contemplation stage of change. Encouraged to decrease tobacco consumption to reduce the risk for further cardiovascular, respiratory and malignant disease in the future. Continue to monitor.

## 2016-10-23 NOTE — Progress Notes (Deleted)
Cardiology Office Note   Date:  10/23/2016   ID:  Ronnie [name], DOB Feb 11, 1976, MRN [id number]  PCP:  Veryl Speak, FNP  Cardiologist:   Rollene Rotunda, MD  Referring:  ***  No chief complaint on file.     History of Present Illness: Ronnie [name] is a 41 y.o. male who presents for evaluation of cardiomyopathy.  He was in the hospital in April with unresponsiveness thought to be related to alcohol and drug use.  He had aspiration pneumonia and acute renal insufficiency.   He did have a mildly elevated troponin.    EF was 35 - 40%.   ***     Past Medical History:  Diagnosis Date  . Hypertension   . Substance abuse     Past Surgical History:  Procedure Laterality Date  . OTHER SURGICAL HISTORY     No surgical history      Current Outpatient Prescriptions  Medication Sig Dispense Refill  . amLODipine (NORVASC) 5 MG tablet Take 1 tablet (5 mg total) by mouth daily. 90 tablet 0  . folic acid (FOLVITE) 1 MG tablet Take 1 tablet (1 mg total) by mouth daily. 30 tablet 0   No current facility-administered medications for this visit.     Allergies:   Patient has no known allergies.    Social History:  The patient  reports that he has been smoking Cigarettes.  He has a 12.50 pack-year smoking history. He has never used smokeless tobacco. He reports that he drinks about 3.6 oz of alcohol per week . He reports that he uses drugs, including Cocaine.   Family History:  The patient's ***family history includes Heart disease in his maternal grandmother and mother.    ROS:  Please see the history of present illness.   Otherwise, review of systems are positive for {NONE DEFAULTED:18576::"none"}.   All other systems are reviewed and negative.    PHYSICAL EXAM: VS:  There were no vitals taken for this visit. , BMI There is no height or weight on file to calculate BMI. GENERAL:  Well appearing HEENT:  Pupils equal round and reactive, fundi not visualized, oral mucosa  unremarkable NECK:  No jugular venous distention, waveform within normal limits, carotid upstroke brisk and symmetric, no bruits, no thyromegaly LYMPHATICS:  No cervical, inguinal adenopathy LUNGS:  Clear to auscultation bilaterally BACK:  No CVA tenderness CHEST:  Unremarkable HEART:  PMI not displaced or sustained,S1 and S2 within normal limits, no S3, no S4, no clicks, no rubs, *** murmurs ABD:  Flat, positive bowel sounds normal in frequency in pitch, no bruits, no rebound, no guarding, no midline pulsatile mass, no hepatomegaly, no splenomegaly EXT:  2 plus pulses throughout, no edema, no cyanosis no clubbing SKIN:  No rashes no nodules NEURO:  Cranial nerves II through XII grossly intact, motor grossly intact throughout PSYCH:  Cognitively intact, oriented to person place and time    EKG:  EKG {ACTION; IS/IS WUJ:[id number]} ordered today. The ekg ordered today demonstrates ***   Recent Labs: 09/13/2016: Magnesium 1.9 10/03/2016: ALT 16; BUN 7; Creatinine, Ser 1.17; Hemoglobin 13.0; Platelets 273.0; Potassium 3.8; Sodium 141    Lipid Panel    Component Value Date/Time   CHOL 157 09/16/2016 0328   TRIG 67 09/16/2016 0328   HDL 63 09/16/2016 0328   CHOLHDL 2.5 09/16/2016 0328   VLDL 13 09/16/2016 0328   LDLCALC 81 09/16/2016 0328      Wt Readings from Last 3 Encounters:  10/03/16 148 lb 12.8 oz (67.5 kg)  09/16/16 151 lb (68.5 kg)  02/27/14 157 lb (71.2 kg)      Other studies Reviewed: Additional studies/ records that were reviewed today include: ***. Review of the above records demonstrates:  Please see elsewhere in the note.  ***   ASSESSMENT AND PLAN:  DILATED CARDIOMYOPATHY:  ***  ELEVATED TROPONIN:  ***   Current medicines are reviewed at length with the patient today.  The patient {ACTIONS; HAS/DOES NOT HAVE:19233} concerns regarding medicines.  The following changes have been made:  {PLAN; NO CHANGE:13088:s}  Labs/ tests ordered today include: *** No  orders of the defined types were placed in this encounter.    Disposition:   FU with ***    Signed, Rollene Rotunda, MD  10/23/2016 9:50 PM    [facility]

## 2016-10-24 ENCOUNTER — Ambulatory Visit: Payer: BLUE CROSS/BLUE SHIELD | Admitting: Nurse Practitioner

## 2016-10-24 ENCOUNTER — Ambulatory Visit: Payer: BLUE CROSS/BLUE SHIELD | Admitting: Cardiology

## 2016-10-31 ENCOUNTER — Ambulatory Visit (INDEPENDENT_AMBULATORY_CARE_PROVIDER_SITE_OTHER): Payer: BLUE CROSS/BLUE SHIELD | Admitting: Cardiovascular Disease

## 2016-10-31 ENCOUNTER — Encounter: Payer: Self-pay | Admitting: Cardiovascular Disease

## 2016-10-31 VITALS — BP 142/90 | Ht 68.0 in | Wt 144.0 lb

## 2016-10-31 DIAGNOSIS — R931 Abnormal findings on diagnostic imaging of heart and coronary circulation: Secondary | ICD-10-CM

## 2016-10-31 DIAGNOSIS — N179 Acute kidney failure, unspecified: Secondary | ICD-10-CM | POA: Diagnosis not present

## 2016-10-31 MED ORDER — CARVEDILOL 3.125 MG PO TABS: 3.1250 mg | ORAL_TABLET | Freq: Two times a day (BID) | ORAL | 3 refills | Status: DC

## 2016-10-31 MED ORDER — LISINOPRIL 5 MG PO TABS: 5.0000 mg | ORAL_TABLET | Freq: Every day | ORAL | 3 refills | Status: DC

## 2016-10-31 NOTE — Assessment & Plan Note (Signed)
History of hypertension with blood pressure measured 142/90. He was placed on amlodipine. His 2-D echo performed 09/13/16 showed an EF of 35-40% but does not show LVH. I am. Going to discontinue his amlodipine and begin low-dose carvedilol and an ACE inhibitor. We'll get a basic metabolic panel 2 weeks and he will see Belenda Cruise back after that to titrate his medications.

## 2016-10-31 NOTE — Assessment & Plan Note (Signed)
History tobacco abuse smoking probably one half pack per day since he was 41 years old (30 years). We have talked about the importance of smoking cessation

## 2016-10-31 NOTE — Progress Notes (Signed)
10/31/2016 Ronnie [name]   [birth year]  [id number]  Primary Physician Ronnie Speak, FNP Primary Cardiologist: Ronnie Gess MD Ronnie [name]  HPI:  [name] is a pleasant 41 year old single thin-appearing African-American American male with no children is accompanied by Ronnie [name], his significant other. He is referred by Ronnie Mam, FNP, for evaluation and treatment of cardiomyopathy. He has a history of tobacco abuse, alcohol abuse and cocaine abuse. 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 He was recently admitted with loss of consciousness and probably pneumonia and briefly intubated. 2-D echo revealed EF of 35-40% with diffuse hypokinesia he really does not have symptoms of congestive heart failure and occasionally gets palpitations and atypical chest pain.   Current Outpatient Prescriptions  Medication Sig Dispense Refill  . carvedilol (COREG) 3.125 MG tablet Take 1 tablet (3.125 mg total) by mouth 2 (two) times daily. 60 tablet 3  . folic acid (FOLVITE) 1 MG tablet Take 1 tablet (1 mg total) by mouth daily. 30 tablet 0  . lisinopril (PRINIVIL,ZESTRIL) 5 MG tablet Take 1 tablet (5 mg total) by mouth daily. 30 tablet 3   No current facility-administered medications for this visit.     No Known Allergies  Social History   Social History  . Marital status: Married    Spouse name: N/A  . Number of children: 1  . Years of education: 65   Occupational History  . Skilled Laborer    Social History Main Topics  . Smoking status: Current Every Day Smoker    Packs/day: 0.50    Years: 25.00    Types: Cigarettes  . Smokeless tobacco: Never Used  . Alcohol use 3.6 oz/week    6 Cans of beer per week  . Drug use: Yes    Types: Cocaine  . Sexual activity: Yes    Partners: Female    Birth control/ protection: None   Other Topics Concern  . Not on file   Social History Narrative   Fun/Hobby: Fishing    Patient lives with girlfriend. Alcohol drinker and  cocaine user.      Review of Systems: General: negative for chills, fever, night sweats or weight changes.  Cardiovascular: negative for chest pain, dyspnea on exertion, edema, orthopnea, palpitations, paroxysmal nocturnal dyspnea or shortness of breath Dermatological: negative for rash Respiratory: negative for cough or wheezing Urologic: negative for hematuria Abdominal: negative for nausea, vomiting, diarrhea, bright red blood per rectum, melena, or hematemesis Neurologic: negative for visual changes, syncope, or dizziness All other systems reviewed and are otherwise negative except as noted above.    Blood pressure (!) 142/90, height 5\' 8"  (1.727 m), weight 144 lb (65.3 kg).  General appearance: alert and no distress Neck: no adenopathy, no carotid bruit, no JVD, supple, symmetrical, trachea midline and thyroid not enlarged, symmetric, no tenderness/mass/nodules Lungs: clear to auscultation bilaterally Heart: regular rate and rhythm, S1, S2 normal, no murmur, click, rub or gallop Extremities: extremities normal, atraumatic, no cyanosis or edema  EKG sinus tachycardia 107 evidence a left vertebral hypertrophy. I personally reviewed this EKG  ASSESSMENT AND PLAN:   Smoker History tobacco abuse smoking probably one half pack per day since he was 41 years old (30 years). We have talked about the importance of smoking cessation  Essential hypertension History of hypertension with blood pressure measured 142/90. He was placed on amlodipine. His 2-D echo performed 09/13/16 showed an EF of 35-40% but does not show LVH. I am. Going  to discontinue his amlodipine and begin low-dose carvedilol and an ACE inhibitor. We'll get a basic metabolic panel 2 weeks and he will see Ronnie [name] back after that to titrate his medications.  Decreased cardiac ejection fraction Recent echo performed 09/13/16 revealed EF of 35-40% with diffuse hypokinesia. 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 We talked about the importance of risk factor modification including smoking cessation, discontinuation of cocaine and medication compliance. Without loss of the top of the importance of stopping alcohol intake. I'm going to stop his calcium channel blocker and start low-dose carvedilol and an ACE inhibitor and will recheck a 2-D echo in 3 months.      Ronnie Gess MD FACP,FACC,FAHA, [facility] 10/31/2016 2:15 PM

## 2016-10-31 NOTE — Assessment & Plan Note (Signed)
Recent echo performed 09/13/16 revealed EF of 35-40% with diffuse hypokinesia. 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 We talked about the importance of risk factor modification including smoking cessation, discontinuation of cocaine and medication compliance. Without loss of the top of the importance of stopping alcohol intake. I'm going to stop his calcium channel blocker and start low-dose carvedilol and an ACE inhibitor and will recheck a 2-D echo in 3 months.

## 2016-10-31 NOTE — Patient Instructions (Signed)
Medication Instructions: STOP Amlodipine  START Lisinopril 5 mg daily and Carvedilol 3.125 mg twice daily.   Labwork: Your physician recommends that you return for lab work in: 2 weeks (June 22nd)--BMET   Testing/Procedures: Your physician has requested that you have an echocardiogram. Echocardiography is a painless test that uses sound waves to create images of your heart. It provides your doctor with information about the size and shape of your heart and how well your heart's chambers and valves are working. This procedure takes approximately one hour. There are no restrictions for this procedure.  Follow-Up: Your physician recommends that you schedule a follow-up appointment in: 1 month with PharmD to titrate BP medications.  Your physician recommends that you schedule a follow-up appointment with Dr. Allyson Sabal after your echo.  If you need a refill on your cardiac medications before your next appointment, please call your pharmacy.

## 2016-11-05 NOTE — Addendum Note (Signed)
Addended by: Beryle Quant on: 11/05/2016 01:28 PM   Modules accepted: Orders

## 2016-11-14 ENCOUNTER — Ambulatory Visit (HOSPITAL_COMMUNITY): Payer: BLUE CROSS/BLUE SHIELD

## 2016-11-17 ENCOUNTER — Ambulatory Visit (HOSPITAL_COMMUNITY): Payer: BLUE CROSS/BLUE SHIELD

## 2016-11-17 ENCOUNTER — Encounter (HOSPITAL_COMMUNITY): Payer: Self-pay | Admitting: Cardiovascular Disease

## 2016-11-17 ENCOUNTER — Telehealth (HOSPITAL_COMMUNITY): Payer: Self-pay | Admitting: Cardiovascular Disease

## 2016-11-18 NOTE — Telephone Encounter (Signed)
11/17/2016 08:10 AM Phone (Outgoing) [name], Ronnie (Self) [phone] (H)   Left Message - Called pt and lmsg for him to CB ...echo is not needed until Sept.     By Elita Boone

## 2016-11-21 ENCOUNTER — Ambulatory Visit: Payer: BLUE CROSS/BLUE SHIELD | Admitting: Cardiovascular Disease

## 2016-11-28 ENCOUNTER — Encounter: Payer: Self-pay | Admitting: Cardiovascular Disease

## 2016-11-28 ENCOUNTER — Ambulatory Visit (INDEPENDENT_AMBULATORY_CARE_PROVIDER_SITE_OTHER): Payer: BLUE CROSS/BLUE SHIELD | Admitting: Cardiovascular Disease

## 2016-11-28 DIAGNOSIS — R931 Abnormal findings on diagnostic imaging of heart and coronary circulation: Secondary | ICD-10-CM | POA: Diagnosis not present

## 2016-11-28 MED ORDER — CARVEDILOL 6.25 MG PO TABS: 6.2500 mg | ORAL_TABLET | Freq: Two times a day (BID) | ORAL | 3 refills | Status: DC

## 2016-11-28 NOTE — Assessment & Plan Note (Signed)
Mr. Lantis returns today for medicine titration. He was begun on carvedilol to 3.125 mg by mouth twice a day several weeks ago which she is tolerating well. We will increase this to 6.25 mg by mouth twice a day. He will see a pharmacist in our office in 2 weeks for medicine titration with the ultimate goal of titrating to carvedilol 25 mg by mouth twice a day time I see him back in 3 months at which time we will get a 2-D echocardiogram.

## 2016-11-28 NOTE — Progress Notes (Signed)
Ronnie [name] returns today for medicine titration. He was begun on carvedilol to 3.125 mg by mouth twice a day several weeks ago which she is tolerating well. We will increase this to 6.25 mg by mouth twice a day. He will see a pharmacist in our office in 2 weeks for medicine titration with the ultimate goal of titrating to carvedilol 25 mg by mouth twice a day time I see him back in 3 months at which time we will get a 2-D echocardiogram. 

## 2016-11-28 NOTE — Patient Instructions (Signed)
Medication Instructions: Increase Carvedilol to 6.25 mg twice daily.   Testing/Procedures: Keep appointment for echo.  Follow-Up: Your physician recommends that you schedule a follow-up appointment in: 2 weeks with PharmD.  Your physician recommends that you schedule a follow-up appointment in: 3 months with Dr. Gerrie Nordmann echo is completed.  If you need a refill on your cardiac medications before your next appointment, please call your pharmacy.

## 2016-12-05 ENCOUNTER — Ambulatory Visit: Payer: BLUE CROSS/BLUE SHIELD

## 2016-12-12 ENCOUNTER — Encounter: Payer: BLUE CROSS/BLUE SHIELD | Admitting: Family

## 2016-12-19 ENCOUNTER — Ambulatory Visit (INDEPENDENT_AMBULATORY_CARE_PROVIDER_SITE_OTHER): Payer: BLUE CROSS/BLUE SHIELD | Admitting: Pharmacist

## 2016-12-19 VITALS — BP 158/100 | HR 80

## 2016-12-19 DIAGNOSIS — F17200 Nicotine dependence, unspecified, uncomplicated: Secondary | ICD-10-CM

## 2016-12-19 DIAGNOSIS — I10 Essential (primary) hypertension: Secondary | ICD-10-CM

## 2016-12-19 DIAGNOSIS — R931 Abnormal findings on diagnostic imaging of heart and coronary circulation: Secondary | ICD-10-CM

## 2016-12-19 LAB — BASIC METABOLIC PANEL
BUN/Creatinine Ratio: 10 (ref 9–20)
BUN: 10 mg/dL (ref 6–24)
CALCIUM: 9.5 mg/dL (ref 8.7–10.2)
CO2: 21 mmol/L (ref 20–29)
CREATININE: 0.96 mg/dL (ref 0.76–1.27)
Chloride: 102 mmol/L (ref 96–106)
GFR calc Af Amer: 112 mL/min/{1.73_m2} (ref >59)
GFR, EST NON AFRICAN AMERICAN: 97 mL/min/{1.73_m2} (ref >59)
Glucose: 66 mg/dL (ref 65–99)
Potassium: 4 mmol/L (ref 3.5–5.2)
Sodium: 143 mmol/L (ref 134–144)

## 2016-12-19 MED ORDER — CANDESARTAN CILEXETIL 8 MG PO TABS: 8.0000 mg | ORAL_TABLET | Freq: Two times a day (BID) | ORAL | 1 refills | Status: DC

## 2016-12-19 NOTE — Progress Notes (Signed)
Patient ID: Ronnie [name]                 DOB: 17-Feb-1976                      MRN: [id number]     HPI: Ronnie [name] is a 41 y.o. male referred by Dr. Allyson Sabal to HTN clinic. 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 Also reports occasional palpitations, and atypical chest pain. EF performed on 09/13/16 showed EF of 35-40%. Cardiologist  discontinued his amlodipine and started carvedilol plus ACE inhibitor on 10/31/16. Carvedilol dose was increased on 11/28/2016 by DR Allyson Sabal.   Patient presents to HTN clinic today for initial visit. Denies chest pain, dizziness, swelling or increase fatigue. Reports occassional headaches and persistent dry cough since taking lisinopril.   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 Hypertension   . Substance abuse     Current Outpatient Prescriptions on File Prior to Visit  Medication Sig Dispense Refill  . carvedilol (COREG) 6.25 MG tablet Take 1 tablet (6.25 mg total) by mouth 2 (two) times daily. 60 tablet 3  . folic acid (FOLVITE) 1 MG tablet  Take 1 tablet (1 mg total) by mouth daily. 30 tablet 0   No current facility-administered medications on file prior to visit.     No Known Allergies  Blood pressure (!) 158/100, pulse 80, SpO2 99 %.  Decreased cardiac ejection fraction Spent significant amount of time educating patient about reasons for decreased ejection fraction and  lifestyle modification needed to improve cardiac health. Patient tolerating carvedilol without problems. Will continue carvedilol 6.25mg  twice daily for now until BP response to candesartan 8mg  twice daily know. If patient tolerates candesartan , plan to increase carvedilol to 12.5mg  twice daily during follow up in 2 weeks.  Smoker Patient continues to smoke 1/2 pack of cigarettes (minimum) per day. Express interest on quitting but prefers to hold on  initiation of prescription medication for now. Will work on decreasing number of cigarettes from 12 to 6 per day by next OV in 2 weeks.   Essential hypertension Blood pressure remains uncontrolled in office and at home. Development of dry cough with lisinopril. Will change lisinopril to candesartan 8mg  twice daily, and continue carvedilol 6.25mg  twice daily. Lifestyle modifications like: decrease sodium/salt in diet, smoking cessation, decrease alcohol intake and stop using cocaine discussed at length and strongly encouraged to patient. Recommendation to seek professional help for alcohol and cocaine abuse also discussed during office visit. Plan to increase carvedilol to  12.5mg  twice daily during next OV if allow by blood pressure.   Anaika Santillano Rodriguez-Guzman PharmD, BCPS [facility] [address] [place] [address] 12/19/2016 5:49 PM

## 2016-12-19 NOTE — Assessment & Plan Note (Signed)
Patient continues to smoke 1/2 pack of cigarettes (minimum) per day. Express interest on quitting but prefers to hold on  initiation of prescription medication for now. Will work on decreasing number of cigarettes from 12 to 6 per day by next OV in 2 weeks.

## 2016-12-19 NOTE — Assessment & Plan Note (Signed)
Spent significant amount of time educating patient about reasons for decreased ejection fraction and  lifestyle modification needed to improve cardiac health. Patient tolerating carvedilol without problems. Will continue carvedilol 6.25mg  twice daily for now until BP response to candesartan 8mg  twice daily know. If patient tolerates candesartan , plan to increase carvedilol to 12.5mg  twice daily during follow up in 2 weeks.

## 2016-12-19 NOTE — Patient Instructions (Addendum)
Return for a a follow up appointment in 2 weeks  Your blood pressure today is 158/100 pulse 80   Check your blood pressure at home daily (if able) and keep record of the readings.  Take your BP meds as follows: STOP taking lisinopril START taking cardesartan 8mg  twice daily Continue taking Carvedilol 6.25mg  twice daily  Bring all of your meds, your BP cuff and your record of home blood pressures to your next appointment.  Exercise as you're able, try to walk approximately 30 minutes per day.  Keep salt intake to a minimum, especially watch canned and prepared boxed foods.  Eat more fresh fruits and vegetables and fewer canned items.  Avoid eating in fast food restaurants.    HOW TO TAKE YOUR BLOOD PRESSURE: . Rest 5 minutes before taking your blood pressure. .  Don't smoke or drink caffeinated beverages for at least 30 minutes before. . Take your blood pressure before (not after) you eat. . Sit comfortably with your back supported and both feet on the floor (don't cross your legs). . Elevate your arm to heart level on a table or a desk. . Use the proper sized cuff. It should fit smoothly and snugly around your bare upper arm. There should be enough room to slip a fingertip under the cuff. The bottom edge of the cuff should be 1 inch above the crease of the elbow. . Ideally, take 3 measurements at one sitting and record the average.

## 2016-12-19 NOTE — Assessment & Plan Note (Signed)
Blood pressure remains uncontrolled in office and at home. Development of dry cough with lisinopril. Will change lisinopril to candesartan 8mg  twice daily, and continue carvedilol 6.25mg  twice daily. Lifestyle modifications like: decrease sodium/salt in diet, smoking cessation, decrease alcohol intake and stop using cocaine discussed at length and strongly encouraged to patient. Recommendation to seek professional help for alcohol and cocaine abuse also discussed during office visit. Plan to increase carvedilol to 12.5mg  twice daily during next OV if allow by blood pressure.

## 2016-12-20 ENCOUNTER — Encounter: Payer: Self-pay | Admitting: Pharmacist

## 2016-12-26 ENCOUNTER — Ambulatory Visit (INDEPENDENT_AMBULATORY_CARE_PROVIDER_SITE_OTHER): Payer: BLUE CROSS/BLUE SHIELD | Admitting: Family

## 2016-12-26 ENCOUNTER — Encounter: Payer: Self-pay | Admitting: Family

## 2016-12-26 ENCOUNTER — Telehealth: Payer: Self-pay | Admitting: Cardiovascular Disease

## 2016-12-26 ENCOUNTER — Other Ambulatory Visit (INDEPENDENT_AMBULATORY_CARE_PROVIDER_SITE_OTHER): Payer: BLUE CROSS/BLUE SHIELD

## 2016-12-26 VITALS — BP 142/100 | HR 88 | Temp 98.0°F | Resp 12 | Ht 72.0 in | Wt 149.0 lb

## 2016-12-26 DIAGNOSIS — R0683 Snoring: Secondary | ICD-10-CM | POA: Diagnosis not present

## 2016-12-26 DIAGNOSIS — Z0000 Encounter for general adult medical examination without abnormal findings: Secondary | ICD-10-CM

## 2016-12-26 DIAGNOSIS — F17200 Nicotine dependence, unspecified, uncomplicated: Secondary | ICD-10-CM | POA: Diagnosis not present

## 2016-12-26 DIAGNOSIS — Z0001 Encounter for general adult medical examination with abnormal findings: Secondary | ICD-10-CM | POA: Diagnosis not present

## 2016-12-26 DIAGNOSIS — G4730 Sleep apnea, unspecified: Secondary | ICD-10-CM | POA: Insufficient documentation

## 2016-12-26 DIAGNOSIS — I10 Essential (primary) hypertension: Secondary | ICD-10-CM | POA: Diagnosis not present

## 2016-12-26 LAB — LIPID PANEL
CHOL/HDL RATIO: 3
Cholesterol: 178 mg/dL (ref 0–200)
HDL: 67.4 mg/dL (ref >39.00)
LDL Cholesterol: 92 mg/dL (ref 0–99)
NONHDL: 110.94
Triglycerides: 95 mg/dL (ref 0.0–149.0)
VLDL: 19 mg/dL (ref 0.0–40.0)

## 2016-12-26 LAB — COMPREHENSIVE METABOLIC PANEL
ALBUMIN: 4.5 g/dL (ref 3.5–5.2)
ALK PHOS: 111 U/L (ref 39–117)
ALT: 12 U/L (ref 0–53)
AST: 17 U/L (ref 0–37)
BILIRUBIN TOTAL: 0.7 mg/dL (ref 0.2–1.2)
BUN: 11 mg/dL (ref 6–23)
CALCIUM: 9.4 mg/dL (ref 8.4–10.5)
CO2: 27 mEq/L (ref 19–32)
CREATININE: 1.22 mg/dL (ref 0.40–1.50)
Chloride: 105 mEq/L (ref 96–112)
GFR: 83.5 mL/min (ref >60.00)
Glucose, Bld: 82 mg/dL (ref 70–99)
Potassium: 3.6 mEq/L (ref 3.5–5.1)
Sodium: 141 mEq/L (ref 135–145)
TOTAL PROTEIN: 7.7 g/dL (ref 6.0–8.3)

## 2016-12-26 LAB — CBC
HCT: 42.4 % (ref 39.0–52.0)
HEMOGLOBIN: 14.2 g/dL (ref 13.0–17.0)
MCHC: 33.4 g/dL (ref 30.0–36.0)
MCV: 95.6 fl (ref 78.0–100.0)
PLATELETS: 216 10*3/uL (ref 150.0–400.0)
RBC: 4.44 Mil/uL (ref 4.22–5.81)
RDW: 14.5 % (ref 11.5–15.5)
WBC: 8.2 10*3/uL (ref 4.0–10.5)

## 2016-12-26 MED ORDER — CARVEDILOL 6.25 MG PO TABS: 12.5000 mg | ORAL_TABLET | Freq: Two times a day (BID) | ORAL | 3 refills | Status: DC

## 2016-12-26 NOTE — Telephone Encounter (Signed)
Returned call to patient he stated he started taking Candesartan 8 mg twice a day this past Mon 12/22/16.Stated continues to have a cough.Stated he feels worse.Stated he has appointment with our pharmacist Friday 8/10.Advised I will send message to pharmacy for advice

## 2016-12-26 NOTE — Patient Instructions (Signed)
Thank you for choosing [facility].  SUMMARY AND INSTRUCTIONS:  Please continue to take your medications.  Follow up with the blood pressure clinic.  Consider Chantix to help with stop smoking.   Consider AA or NA for substance abuse help.   Labs:  Please stop by the lab on the lower level of the building for your blood work. Your results will be released to MyChart (or called to you) after review, usually within 72 hours after test completion. If any changes need to be made, you will be notified at that same time.  1.) The lab is open from 7:30am to 5:30 pm Monday-Friday 2.) No appointment is necessary 3.) Fasting (if needed) is 6-8 hours after food and drink; black coffee and water are okay   Follow up:  If your symptoms worsen or fail to improve, please contact our office for further instruction, or in case of emergency go directly to the emergency room at the closest medical facility.    Health Maintenance, Male A healthy lifestyle and preventive care is important for your health and wellness. Ask your health care provider about what schedule of regular examinations is right for you. What should I know about weight and diet? Eat a Healthy Diet  Eat plenty of vegetables, fruits, whole grains, low-fat dairy products, and lean protein.  Do not eat a lot of foods high in solid fats, added sugars, or salt.  Maintain a Healthy Weight Regular exercise can help you achieve or maintain a healthy weight. You should:  Do at least 150 minutes of exercise each week. The exercise should increase your heart rate and make you sweat (moderate-intensity exercise).  Do strength-training exercises at least twice a week.  Watch Your Levels of Cholesterol and Blood Lipids  Have your blood tested for lipids and cholesterol every 5 years starting at 43 years of age. If you are at high risk for heart disease, you should start having your blood tested when you are 43 years old. You may  need to have your cholesterol levels checked more often if: ? Your lipid or cholesterol levels are high. ? You are older than 43 years of age. ? You are at high risk for heart disease.  What should I know about cancer screening? Many types of cancers can be detected early and may often be prevented. Lung Cancer  You should be screened every year for lung cancer if: ? You are a current smoker who has smoked for at least 30 years. ? You are a former smoker who has quit within the past 15 years.  Talk to your health care provider about your screening options, when you should start screening, and how often you should be screened.  Colorectal Cancer  Routine colorectal cancer screening usually begins at 43 years of age and should be repeated every 5-10 years until you are 43 years old. You may need to be screened more often if early forms of precancerous polyps or small growths are found. Your health care provider may recommend screening at an earlier age if you have risk factors for colon cancer.  Your health care provider may recommend using home test kits to check for hidden blood in the stool.  A small camera at the end of a tube can be used to examine your colon (sigmoidoscopy or colonoscopy). This checks for the earliest forms of colorectal cancer.  Prostate and Testicular Cancer  Depending on your age and overall health, your health care provider may do  certain tests to screen for prostate and testicular cancer.  Talk to your health care provider about any symptoms or concerns you have about testicular or prostate cancer.  Skin Cancer  Check your skin from head to toe regularly.  Tell your health care provider about any new moles or changes in moles, especially if: ? There is a change in a mole's size, shape, or color. ? You have a mole that is larger than a pencil eraser.  Always use sunscreen. Apply sunscreen liberally and repeat throughout the day.  Protect yourself by  wearing long sleeves, pants, a wide-brimmed hat, and sunglasses when outside.  What should I know about heart disease, diabetes, and high blood pressure?  If you are 43-76 years of age, have your blood pressure checked every 3-5 years. If you are 62 years of age or older, have your blood pressure checked every year. You should have your blood pressure measured twice-once when you are at a hospital or clinic, and once when you are not at a hospital or clinic. Record the average of the two measurements. To check your blood pressure when you are not at a hospital or clinic, you can use: ? An automated blood pressure machine at a pharmacy. ? A home blood pressure monitor.  Talk to your health care provider about your target blood pressure.  If you are between 20-69 years old, ask your health care provider if you should take aspirin to prevent heart disease.  Have regular diabetes screenings by checking your fasting blood sugar level. ? If you are at a normal weight and have a low risk for diabetes, have this test once every three years after the age of 64. ? If you are overweight and have a high risk for diabetes, consider being tested at a younger age or more often.  A one-time screening for abdominal aortic aneurysm (AAA) by ultrasound is recommended for men aged 48-75 years who are current or former smokers. What should I know about preventing infection? Hepatitis B If you have a higher risk for hepatitis B, you should be screened for this virus. Talk with your health care provider to find out if you are at risk for hepatitis B infection. Hepatitis C Blood testing is recommended for:  Everyone born from [birth year] through 1967.  Anyone with known risk factors for hepatitis C.  Sexually Transmitted Diseases (STDs)  You should be screened each year for STDs including gonorrhea and chlamydia if: ? You are sexually active and are younger than 43 years of age. ? You are older than 43 years of age  and your health care provider tells you that you are at risk for this type of infection. ? Your sexual activity has changed since you were last screened and you are at an increased risk for chlamydia or gonorrhea. Ask your health care provider if you are at risk.  Talk with your health care provider about whether you are at high risk of being infected with HIV. Your health care provider may recommend a prescription medicine to help prevent HIV infection.  What else can I do?  Schedule regular health, dental, and eye exams.  Stay current with your vaccines (immunizations).  Do not use any tobacco products, such as cigarettes, chewing tobacco, and e-cigarettes. If you need help quitting, ask your health care provider.  Limit alcohol intake to no more than 2 drinks per day. One drink equals 12 ounces of beer, 5 ounces of wine, or 1 ounces of  hard liquor.  Do not use street drugs.  Do not share needles.  Ask your health care provider for help if you need support or information about quitting drugs.  Tell your health care provider if you often feel depressed.  Tell your health care provider if you have ever been abused or do not feel safe at home. This information is not intended to replace advice given to you by your health care provider. Make sure you discuss any questions you have with your health care provider. Document Released: 11/08/2007 Document Revised: 01/09/2016 Document Reviewed: 02/13/2015 Elsevier Interactive Patient Education  [employer].

## 2016-12-26 NOTE — Progress Notes (Signed)
Subjective:    Patient ID: Ronnie [name], male    DOB: [birth year], 41 y.o.   MRN: [id number]  Chief Complaint  Patient presents with  . Annual Exam    HPI:  Ronnie [name] is a 41 y.o. male who presents today for an annual wellness visit.   1) Health Maintenance -   Diet - Averages about 2-3 meals per day consisting of a regular diet - working on a low sodium diet; Caffeine intake 7-10 cups of caffeine per day.   Exercise - Work is very physical. Nothing outside   2) Preventative Exams / Immunizations:  Dental -- Up to date  Vision -- Due for exam   Health Maintenance  Topic Date Due  . INFLUENZA VACCINE  12/24/2016  . TETANUS/TDAP  07/09/2024  . HIV Screening  Completed    Immunization History  Administered Date(s) Administered  . Tdap 07/09/2014     No Known Allergies   Outpatient Medications Prior to Visit  Medication Sig Dispense Refill  . candesartan (ATACAND) 8 MG tablet Take 1 tablet (8 mg total) by mouth 2 (two) times daily. 60 tablet 1  . carvedilol (COREG) 6.25 MG tablet Take 1 tablet (6.25 mg total) by mouth 2 (two) times daily. 60 tablet 3  . folic acid (FOLVITE) 1 MG tablet Take 1 tablet (1 mg total) by mouth daily. 30 tablet 0   No facility-administered medications prior to visit.      Past Medical History:  Diagnosis Date  . Hypertension   . Substance abuse      Past Surgical History:  Procedure Laterality Date  . OTHER SURGICAL HISTORY     No surgical history      Family History  Problem Relation Age of Onset  . Heart disease Mother   . Heart disease Maternal Grandmother      Social History   Social History  . Marital status: Married    Spouse name: N/A  . Number of children: 1  . Years of education: 6   Occupational History  . Skilled Laborer    Social History Main Topics  . Smoking status: Current Every Day Smoker    Packs/day: 0.50    Years: 25.00    Types: Cigarettes  . Smokeless tobacco: Never Used  .  Alcohol use 3.6 oz/week    6 Cans of beer per week  . Drug use: Yes    Types: Cocaine  . Sexual activity: Yes    Partners: Female    Birth control/ protection: None   Other Topics Concern  . Not on file   Social History Narrative   Fun/Hobby: Fishing    Patient lives with girlfriend. Alcohol drinker and cocaine user.       Review of Systems  Constitutional: Denies fever, chills, fatigue, or significant weight gain/loss. HENT: Head: Denies headache or neck pain Ears: Denies changes in hearing, ringing in ears, earache, drainage Nose: Denies discharge, stuffiness, itching, nosebleed, sinus pain Throat: Denies sore throat, hoarseness, dry mouth, sores, thrush Eyes: Denies loss/changes in vision, pain, redness, blurry/double vision, flashing lights Cardiovascular: Denies chest pain/discomfort, tightness, palpitations, shortness of breath with activity, difficulty lying down, swelling, sudden awakening with shortness of breath Respiratory: Denies shortness of breath, cough, sputum production, wheezing Gastrointestinal: Denies dysphasia, heartburn, change in appetite, nausea, change in bowel habits, rectal bleeding, constipation, diarrhea, yellow skin or eyes Genitourinary: Denies frequency, urgency, burning/pain, blood in urine, incontinence, change in urinary strength. Musculoskeletal: Denies muscle/joint pain, stiffness, back pain,  redness or swelling of joints, trauma Skin: Denies rashes, lumps, itching, dryness, color changes, or hair/nail changes Neurological: Denies dizziness, fainting, seizures, weakness, numbness, tingling, tremor Psychiatric - Denies nervousness, stress, depression or memory loss Endocrine: Denies heat or cold intolerance, sweating, frequent urination, excessive thirst, changes in appetite Hematologic: Denies ease of bruising or bleeding     Objective:     BP (!) 142/100 (BP Location: Left Arm, Patient Position: Sitting, Cuff Size: Normal)   Pulse 88    Temp 98 F (36.7 C) (Oral)   Resp 12   Ht 6' (1.829 m)   Wt 149 lb (67.6 kg)   SpO2 99%   BMI 20.21 kg/m  Nursing note and vital signs reviewed.  Physical Exam  Constitutional: He is oriented to person, place, and time. He appears well-developed and well-nourished.  HENT:  Head: Normocephalic.  Right Ear: Hearing, tympanic membrane, external ear and ear canal normal.  Left Ear: Hearing, tympanic membrane, external ear and ear canal normal.  Nose: Nose normal.  Mouth/Throat: Uvula is midline, oropharynx is clear and moist and mucous membranes are normal.  Eyes: Pupils are equal, round, and reactive to light. Conjunctivae and EOM are normal.  Neck: Neck supple. No JVD present. No tracheal deviation present. No thyromegaly present.  Cardiovascular: Normal rate, regular rhythm, normal heart sounds and intact distal pulses.   Pulmonary/Chest: Effort normal and breath sounds normal.  Abdominal: Soft. Bowel sounds are normal. He exhibits no distension and no mass. There is no tenderness. There is no rebound and no guarding.  Musculoskeletal: Normal range of motion. He exhibits no edema or tenderness.  Lymphadenopathy:    He has no cervical adenopathy.  Neurological: He is alert and oriented to person, place, and time. He has normal reflexes. No cranial nerve deficit. He exhibits normal muscle tone. Coordination normal.  Skin: Skin is warm and dry.  Psychiatric: He has a normal mood and affect. His behavior is normal. Judgment and thought content normal.       Assessment & Plan:   Problem List Items Addressed This Visit      Cardiovascular and Mediastinum   Essential hypertension    Blood pressure remains poorly controlled with current medication regimen and no adverse side effects. Continue current dosage of carvedilol and candesartan. Monitor blood pressure at home and follow sodium diet. Emphasize importance of decreasing cocaine usage as this is likely a significant contributing  factor. Denies worse headache of life with no symptoms of end organ damage noted on physical exam.        Other   Smoker    Continues to smoke approximately one half pack per day. Recommend tobacco cessation to decrease risk for malignancy, cardiovascular, and respiratory disease in the future. He is in the contemplation stage of quitting with inquiry about Chantix. Discussed methods of tobacco cessation including patches, gums, inhalers, and medication. Continue to monitor.      Routine adult health maintenance - Primary    1) Anticipatory Guidance: Discussed importance of wearing a seatbelt while driving and not texting while driving; changing batteries in smoke detector at least once annually; wearing suntan lotion when outside; eating a balanced and moderate diet; getting physical activity at least 30 minutes per day.  2) Immunizations / Screenings / Labs:  All immunizations are up-to-date per recommendations. Due for a vision exam encouraged to be completed independently. All other screenings are up-to-date per recommendations. Obtain CBC, CMET, and lipid profile.   Overall well exam with risk  factors for cardiovascular disease including hypertension, cocaine use, and tobacco use. Blood pressure remains poorly controlled with current medication regimen most likely associated with continued cocaine usage. Continue to follow up with cardiology and blood pressure clinic. Encouraged tobacco cessation to prevent end organ damage in the future. Continue other healthy lifestyle behaviors and choices. Follow-up prevention exam in 1 year. Follow-up office visit pending blood work and for chronic conditions.      Relevant Orders   CBC   Comprehensive metabolic panel   Lipid panel   Snoring    New onset snoring with periods of apnea noted by significant other with concern for sleep apnea. Refer to sleep medicine for further assessment and treatment.      Relevant Orders   Ambulatory referral to  Neurology       I am having Mr. Szostak maintain his folic acid, carvedilol, and candesartan.   Follow-up: Return in about 3 months (around 03/28/2017), or if symptoms worsen or fail to improve.   Jeanine Luz, FNP

## 2016-12-26 NOTE — Assessment & Plan Note (Signed)
Blood pressure remains poorly controlled with current medication regimen and no adverse side effects. Continue current dosage of carvedilol and candesartan. Monitor blood pressure at home and follow sodium diet. Emphasize importance of decreasing cocaine usage as this is likely a significant contributing factor. Denies worse headache of life with no symptoms of end organ damage noted on physical exam.

## 2016-12-26 NOTE — Assessment & Plan Note (Signed)
New onset snoring with periods of apnea noted by significant other with concern for sleep apnea. Refer to sleep medicine for further assessment and treatment.

## 2016-12-26 NOTE — Telephone Encounter (Signed)
Spoke with patient and wife.  He reports increase in coughing since starting candesartan, to the point where he is gagging and vomiting.    Will have him d/c candesartan for now and increase carvedilol to 12.5 mg bid until his scheduled appointment next Friday.  Patient and wife voiced understanding.

## 2016-12-26 NOTE — Assessment & Plan Note (Signed)
1) Anticipatory Guidance: Discussed importance of wearing a seatbelt while driving and not texting while driving; changing batteries in smoke detector at least once annually; wearing suntan lotion when outside; eating a balanced and moderate diet; getting physical activity at least 30 minutes per day.  2) Immunizations / Screenings / Labs:  All immunizations are up-to-date per recommendations. Due for a vision exam encouraged to be completed independently. All other screenings are up-to-date per recommendations. Obtain CBC, CMET, and lipid profile.   Overall well exam with risk factors for cardiovascular disease including hypertension, cocaine use, and tobacco use. Blood pressure remains poorly controlled with current medication regimen most likely associated with continued cocaine usage. Continue to follow up with cardiology and blood pressure clinic. Encouraged tobacco cessation to prevent end organ damage in the future. Continue other healthy lifestyle behaviors and choices. Follow-up prevention exam in 1 year. Follow-up office visit pending blood work and for chronic conditions.

## 2016-12-26 NOTE — Assessment & Plan Note (Signed)
Continues to smoke approximately one half pack per day. Recommend tobacco cessation to decrease risk for malignancy, cardiovascular, and respiratory disease in the future. He is in the contemplation stage of quitting with inquiry about Chantix. Discussed methods of tobacco cessation including patches, gums, inhalers, and medication. Continue to monitor.

## 2016-12-26 NOTE — Telephone Encounter (Signed)
Pt c/o medication issue:  1. Name of Medication:   2. How are you currently taking this medication (dosage and times per day)? Candesartan 8 MG  3. Are you having a reaction (difficulty breathing--STAT)? Dry cough, vomiting  4. What is your medication issue? Patient states that medication is not working and feels worse than the previous medication

## 2017-01-02 ENCOUNTER — Ambulatory Visit (INDEPENDENT_AMBULATORY_CARE_PROVIDER_SITE_OTHER): Payer: BLUE CROSS/BLUE SHIELD | Admitting: Pharmacist

## 2017-01-02 VITALS — BP 140/90 | HR 70

## 2017-01-02 DIAGNOSIS — F17200 Nicotine dependence, unspecified, uncomplicated: Secondary | ICD-10-CM | POA: Diagnosis not present

## 2017-01-02 DIAGNOSIS — I10 Essential (primary) hypertension: Secondary | ICD-10-CM | POA: Diagnosis not present

## 2017-01-02 DIAGNOSIS — R931 Abnormal findings on diagnostic imaging of heart and coronary circulation: Secondary | ICD-10-CM

## 2017-01-02 MED ORDER — CANDESARTAN CILEXETIL 16 MG PO TABS: 16.0000 mg | ORAL_TABLET | Freq: Every day | ORAL | 2 refills | Status: DC

## 2017-01-02 MED ORDER — CARVEDILOL 12.5 MG PO TABS: 12.5000 mg | ORAL_TABLET | Freq: Two times a day (BID) | ORAL | 2 refills | Status: DC

## 2017-01-02 NOTE — Progress Notes (Signed)
Patient ID: Ronnie [name]                 DOB: May 28, 1975                      MRN: [id number]     HPI: Ronnie [name] is a 41 y.o. male referred by Dr. Allyson [name] to HTN clinic. 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 Also reports occasional palpitations, and atypical chest pain. EF performed on 09/13/16 showed EF of 35-40%. Cardiologist  discontinued his amlodipine and started carvedilol plus ACE inhibitor on 10/31/16. Carvedilol dose was increased to 12.5mg  twice daily on 12/26/2016. Patient reports cough with lisinopril and severe cough with candesartan but was able to resume candesartan after 48 hours wash out period.   Patient presents to HTN clinic for follow up. Denies chest pain, dizziness, swelling or increase fatigue. Reports complete resolution of his cough, decrease amount of cigarettes, and no cocaine in the last 3 weeks. Some concern with candesartan price was expressed by patient and wife during his appointment as well.  Current HTN meds:  Carvedilol 12.5 mg twice daily Candesartan 8mg  twice daily  Intolerance: none  BP goal: 130/80  Family History: mother with HTN or hypertension and cardiac disease  Social History: alcohol abuse (~ 6 pack during the week and occasional head liquor intake); still using cocaine (not daily), continue to smoke 8 cigarettes per day (1/2 pack minimum)  Diet: no restrictions; admits to liberal use of salt   Exercise:mainly work related (works in [employer] yard)  Home BP readings: no records available today  Wt Readings from Last 3 Encounters:  12/26/16 149 lb (67.6 kg)  11/28/16 152 lb (68.9 kg)  10/31/16 144 lb (65.3 kg)   BP Readings from Last 3 Encounters:  01/02/17 140/90  12/26/16 (!) 142/100  12/19/16 (!) 158/100   Pulse Readings from Last 3 Encounters:  01/02/17 70  12/26/16 88  12/19/16 80    Past Medical History:  Diagnosis Date  . Hypertension   . Substance abuse     Current  Outpatient Prescriptions on File Prior to Visit  Medication Sig Dispense Refill  . folic acid (FOLVITE) 1 MG tablet Take 1 tablet (1 mg total) by mouth daily. 30 tablet 0   No current facility-administered medications on file prior to visit.     Allergies  Allergen Reactions  . Lisinopril Cough    Blood pressure 140/90, pulse 70.  Decreased cardiac ejection fraction Patient tolerating beta blocker without problems and dose was increase to 12.5mg  twice daily less than 10 days ago. No cocaine use int he last 3 weeks reported by patient. Will continue current therapy and follow up in 4 weeks for further titration of beta-blocker to 25mg  twice daily if possible.  Essential hypertension Blood pressure remain above goal but improved from previous readings. Patient reports tolerating candesartan now after a 48 hours wash-out period. Concerns about cost were expressed by patient. After calling his pharmacy, was noted using 1 tablet of candesartan 16mg  represents less cost for the patient vs using 2 tablets of 8 mg. Carvedilol was increase less than 10 day ago. Will change candesartan 16mg  every morning and carvedilol 12.5mg  twice daily. Plan to increase dose of both medication in 4 weeks if patient able to tolerate current therapy.  Smoker Chantix or nicotine patches were offer to patient as adjuvant for smoking cessation. Patient still on contemplating phase of smoking cessation and not ready to initiate  any additional prescription medication at this time.    [name] PharmD, BCPS [facility] [address] [place] [address] 01/05/2017 7:28 AM

## 2017-01-02 NOTE — Patient Instructions (Addendum)
Return for a a follow up appointment as previously schedule with Dr Allyson Sabal  Your blood pressure today is 140/90 pulse 70  Check your blood pressure at home daily (if able) and keep record of the readings.  Take your BP meds as follows: CONTINUE carvedilol 12.5mg  twice daily (okay to take 2 tablets for 6.25mg  twice daily until all gone)  CONTINUE candesartan 16mg  daily (every morning) - okay to take 2 tablets of 8mg  every morning until all gone   Bring  your BP cuff and your record of home blood pressures to your next appointment.  Exercise as you're able, try to walk approximately 30 minutes per day.  Keep salt intake to a minimum, especially watch canned and prepared boxed foods.  Eat more fresh fruits and vegetables and fewer canned items.  Avoid eating in fast food restaurants.    HOW TO TAKE YOUR BLOOD PRESSURE: . Rest 5 minutes before taking your blood pressure. .  Don't smoke or drink caffeinated beverages for at least 30 minutes before. . Take your blood pressure before (not after) you eat. . Sit comfortably with your back supported and both feet on the floor (don't cross your legs). . Elevate your arm to heart level on a table or a desk. . Use the proper sized cuff. It should fit smoothly and snugly around your bare upper arm. There should be enough room to slip a fingertip under the cuff. The bottom edge of the cuff should be 1 inch above the crease of the elbow. . Ideally, take 3 measurements at one sitting and record the average.

## 2017-01-05 NOTE — Assessment & Plan Note (Signed)
Blood pressure remain above goal but improved from previous readings. Patient reports tolerating candesartan now after a 48 hours wash-out period. Concerns about cost were expressed by patient. After calling his pharmacy, was noted using 1 tablet of candesartan 16mg  represents less cost for the patient vs using 2 tablets of 8 mg. Carvedilol was increase less than 10 day ago. Will change candesartan 16mg  every morning and carvedilol 12.5mg  twice daily. Plan to increase dose of both medication in 4 weeks if patient able to tolerate current therapy.

## 2017-01-05 NOTE — Assessment & Plan Note (Signed)
Chantix or nicotine patches were offer to patient as adjuvant for smoking cessation. Patient still on contemplating phase of smoking cessation and not ready to initiate any additional prescription medication at this time.

## 2017-01-05 NOTE — Assessment & Plan Note (Signed)
Patient tolerating beta blocker without problems and dose was increase to 12.5mg  twice daily less than 10 days ago. No cocaine use int he last 3 weeks reported by patient. Will continue current therapy and follow up in 4 weeks for further titration of beta-blocker to 25mg  twice daily if possible.

## 2017-02-02 ENCOUNTER — Ambulatory Visit (HOSPITAL_COMMUNITY): Payer: BLUE CROSS/BLUE SHIELD | Attending: Cardiovascular Disease

## 2017-02-02 DIAGNOSIS — F1910 Other psychoactive substance abuse, uncomplicated: Secondary | ICD-10-CM | POA: Diagnosis not present

## 2017-02-02 DIAGNOSIS — Z720 Tobacco use: Secondary | ICD-10-CM | POA: Diagnosis not present

## 2017-02-02 DIAGNOSIS — R931 Abnormal findings on diagnostic imaging of heart and coronary circulation: Secondary | ICD-10-CM | POA: Diagnosis not present

## 2017-02-02 DIAGNOSIS — I119 Hypertensive heart disease without heart failure: Secondary | ICD-10-CM | POA: Diagnosis not present

## 2017-02-12 ENCOUNTER — Institutional Professional Consult (permissible substitution): Payer: Self-pay | Admitting: Neurology

## 2017-02-17 ENCOUNTER — Encounter (HOSPITAL_COMMUNITY): Payer: Self-pay | Admitting: *Deleted

## 2017-02-17 ENCOUNTER — Ambulatory Visit (HOSPITAL_COMMUNITY)
Admission: EM | Admit: 2017-02-17 | Discharge: 2017-02-17 | Disposition: A | Discharge status: Home / Self Care | Payer: BLUE CROSS/BLUE SHIELD | Source: Home / Self Care

## 2017-02-17 ENCOUNTER — Other Ambulatory Visit: Payer: Self-pay

## 2017-02-17 ENCOUNTER — Emergency Department (HOSPITAL_COMMUNITY): Payer: BLUE CROSS/BLUE SHIELD

## 2017-02-17 ENCOUNTER — Emergency Department (HOSPITAL_COMMUNITY)
Admission: EM | Admit: 2017-02-17 | Discharge: 2017-02-17 | Disposition: A | Discharge status: Home / Self Care | Payer: BLUE CROSS/BLUE SHIELD | Attending: Emergency Medicine | Admitting: Emergency Medicine

## 2017-02-17 DIAGNOSIS — Z720 Tobacco use: Secondary | ICD-10-CM | POA: Diagnosis not present

## 2017-02-17 DIAGNOSIS — F1410 Cocaine abuse, uncomplicated: Secondary | ICD-10-CM | POA: Diagnosis not present

## 2017-02-17 DIAGNOSIS — I119 Hypertensive heart disease without heart failure: Secondary | ICD-10-CM

## 2017-02-17 DIAGNOSIS — R079 Chest pain, unspecified: Secondary | ICD-10-CM | POA: Diagnosis present

## 2017-02-17 DIAGNOSIS — I420 Dilated cardiomyopathy: Secondary | ICD-10-CM | POA: Diagnosis not present

## 2017-02-17 DIAGNOSIS — F17210 Nicotine dependence, cigarettes, uncomplicated: Secondary | ICD-10-CM | POA: Diagnosis not present

## 2017-02-17 DIAGNOSIS — Z79899 Other long term (current) drug therapy: Secondary | ICD-10-CM | POA: Diagnosis not present

## 2017-02-17 DIAGNOSIS — R0789 Other chest pain: Secondary | ICD-10-CM | POA: Diagnosis not present

## 2017-02-17 DIAGNOSIS — I10 Essential (primary) hypertension: Secondary | ICD-10-CM | POA: Diagnosis not present

## 2017-02-17 LAB — BASIC METABOLIC PANEL
Anion gap: 5 (ref 5–15)
BUN: 9 mg/dL (ref 6–20)
CALCIUM: 9.6 mg/dL (ref 8.9–10.3)
CO2: 28 mmol/L (ref 22–32)
Chloride: 105 mmol/L (ref 101–111)
Creatinine, Ser: 1.17 mg/dL (ref 0.61–1.24)
GFR calc Af Amer: >60 mL/min (ref >60)
GFR calc non Af Amer: >60 mL/min (ref >60)
Glucose, Bld: 111 mg/dL — ABNORMAL HIGH (ref 65–99)
Potassium: 4 mmol/L (ref 3.5–5.1)
Sodium: 138 mmol/L (ref 135–145)

## 2017-02-17 LAB — I-STAT TROPONIN, ED
TROPONIN I, POC: 0 ng/mL (ref 0.00–0.08)
Troponin i, poc: 0 ng/mL (ref 0.00–0.08)

## 2017-02-17 LAB — RAPID URINE DRUG SCREEN, HOSP PERFORMED
Amphetamines: NOT DETECTED
BARBITURATES: NOT DETECTED
Benzodiazepines: NOT DETECTED
Cocaine: NOT DETECTED
OPIATES: NOT DETECTED
TETRAHYDROCANNABINOL: NOT DETECTED

## 2017-02-17 LAB — CBC
HCT: 37.6 % — ABNORMAL LOW (ref 39.0–52.0)
HEMOGLOBIN: 12.8 g/dL — AB (ref 13.0–17.0)
MCH: 31.5 pg (ref 26.0–34.0)
MCHC: 34 g/dL (ref 30.0–36.0)
MCV: 92.6 fL (ref 78.0–100.0)
Platelets: 198 10*3/uL (ref 150–400)
RBC: 4.06 MIL/uL — ABNORMAL LOW (ref 4.22–5.81)
RDW: 14 % (ref 11.5–15.5)
WBC: 7.1 10*3/uL (ref 4.0–10.5)

## 2017-02-17 NOTE — ED Notes (Signed)
Pt 2nd troponin was 0.00 resulted at 1722

## 2017-02-17 NOTE — ED Provider Notes (Signed)
[facility]-EMERGENCY DEPT Provider Note   CSN: [id number] Arrival date & time: 02/17/17  1121     History   Chief Complaint Chief Complaint  Patient presents with  . Chest Pain    HPI Jeremiah Jansson is a 41 y.o. male.  41 year old male history of cocaine abuse and HTN who presents with exertional chest pain.  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  Duration of 3 hours.  Endorses burning left-sided chest pain radiating behind both arms.  Improved with rest.  Last used cocaine reportedly 1 week ago.  Was admitted for acute hypoxic respiratory failure and pneumonia in April 2018.  Found to have LVEF of 35-40% at that time.  Recently had repeat echocardiogram showing improved and normal LVEF. Current smoker. Multiple family members with MI before age 57. Pt denies previous MI or cardiac stent.   The history is provided by the patient, the spouse and medical records. No language interpreter was used.    Past Medical History:  Diagnosis Date  . Hypertension   . Substance abuse     Patient Active Problem List   Diagnosis Date Noted  . Routine adult health maintenance 12/26/2016  . Snoring 12/26/2016  . Decreased cardiac ejection fraction 10/03/2016  . Essential hypertension 10/03/2016  . Ventilator dependent (HCC)   . Encounter for orogastric (OG) tube placement   . Altered mental state 09/11/2016  . Acute kidney injury (HCC) 09/11/2016  . Acute respiratory failure (HCC) 09/11/2016  . Lactic acidosis 09/11/2016  . Smoker 09/11/2016  . ETOH abuse 09/11/2016  . Cocaine abuse 09/11/2016    Past Surgical History:  Procedure Laterality Date  . OTHER SURGICAL HISTORY     No surgical history        Home Medications    Prior to Admission medications   Medication Sig Start Date End Date Taking? Authorizing Provider  candesartan (ATACAND) 16 MG tablet Take 1 tablet (16 mg total) by mouth daily. 01/02/17  Yes Runell Gess, MD  carvedilol (COREG) 12.5 MG tablet Take  1 tablet (12.5 mg total) by mouth 2 (two) times daily with a meal. 01/02/17  Yes Runell Gess, MD  folic acid (FOLVITE) 1 MG tablet Take 1 tablet (1 mg total) by mouth daily. Patient not taking: Reported on 02/17/2017 09/17/16   Albertine Grates, MD    Family History Family History  Problem Relation Age of Onset  . Heart disease Mother   . Heart disease Maternal Grandmother     Social History Social History  Substance Use Topics  . Smoking status: Current Every Day Smoker    Packs/day: 0.50    Years: 25.00    Types: Cigarettes  . Smokeless tobacco: Never Used  . Alcohol use 3.6 oz/week    6 Cans of beer per week     Comment: occ     Allergies   Lisinopril   Review of Systems Review of Systems  Constitutional: Negative for chills and fever.  HENT: Negative for ear pain and sore throat.   Eyes: Negative for pain and visual disturbance.  Respiratory: Negative for cough and shortness of breath.   Cardiovascular: Positive for chest pain. Negative for palpitations.  Gastrointestinal: Negative for abdominal pain and vomiting.  Genitourinary: Negative for dysuria and hematuria.  Musculoskeletal: Negative for arthralgias and back pain.  Skin: Negative for color change and rash.  Neurological: Negative for seizures and syncope.  All other systems reviewed and are negative.    Physical Exam Updated Vital  Signs BP (!) 151/108   Pulse 72   Temp 97.9 F (36.6 C) (Oral)   Resp 15   Ht 6' (1.829 m)   Wt 70.3 kg (155 lb)   SpO2 100%   BMI 21.02 kg/m   Physical Exam  Constitutional: He appears well-developed.  HENT:  Head: Normocephalic and atraumatic.  Eyes: Conjunctivae are normal.  Neck: Neck supple.  Cardiovascular: Normal rate and regular rhythm.   No murmur heard. Pulmonary/Chest: Effort normal and breath sounds normal. No respiratory distress. He exhibits no tenderness.  Abdominal: Soft. There is no tenderness.  Musculoskeletal: He exhibits no edema.    Neurological: He is alert. No cranial nerve deficit. Coordination normal.  5/5 motor strength and intact sensation in all extremities. Intact bilateral finger-to-nose coordination  Skin: Skin is warm and dry.  Nursing note and vitals reviewed.    ED Treatments / Results  Labs (all labs ordered are listed, but only abnormal results are displayed) Labs Reviewed  BASIC METABOLIC PANEL - Abnormal; Notable for the following:       Result Value   Glucose, Bld 111 (*)    All other components within normal limits  CBC - Abnormal; Notable for the following:    RBC 4.06 (*)    Hemoglobin 12.8 (*)    HCT 37.6 (*)    All other components within normal limits  RAPID URINE DRUG SCREEN, HOSP PERFORMED  I-STAT TROPONIN, ED  I-STAT TROPONIN, ED    EKG  EKG Interpretation  Date/Time:  Tuesday February 17 2017 11:19:16 EDT Ventricular Rate:  74 PR Interval:  138 QRS Duration: 84 QT Interval:  394 QTC Calculation: 437 R Axis:   78 Text Interpretation:  Normal sinus rhythm Left ventricular hypertrophy No significant change since last tracing Confirmed by Gwyneth Sprout ([id number]) on 02/17/2017 4:15:14 PM       Radiology Dg Chest 2 View  Result Date: 02/17/2017 CLINICAL DATA:  Acute onset mid chest pain radiating into the left chest associated with shortness of breath. EXAM: CHEST  2 VIEW COMPARISON:  09/16/2016, 09/12/2016, 09/11/2016. FINDINGS: Cardiac silhouette upper normal in size, unchanged. Hilar and mediastinal contours unremarkable. Mild central peribronchial thickening, unchanged. Lungs otherwise clear. No localized airspace consolidation. No pleural effusions. No pneumothorax. Normal pulmonary vascularity. Visualized bony thorax intact. IMPRESSION: Stable mild changes of chronic bronchitis and/or asthma. No acute cardiopulmonary disease. Electronically Signed   By: Hulan Saas M.D.   On: 02/17/2017 11:44    Procedures Procedures (including critical care time)  Medications  Ordered in ED Medications - No data to display   Initial Impression / Assessment and Plan / ED Course  I have reviewed the triage vital signs and the nursing notes.  Pertinent labs & imaging results that were available during my care of the patient were reviewed by me and considered in my medical decision making (see chart for details).     40 yoM last cocaine use 1 week ago who p/w exertional chest pain. Improved and resolved with rest. No recent cardiac interrogation. Lungs CTAB.  EKG showing NSR and LVH. CXR showing stable mild changes of chronic bronchitis and/or asthma. Serials troponins wnl.  Story concerning for warranting further cardiac interrogation. Doubt aortic dissection, PTX, PNA, or Boorehave's. Low risk by Wells and negative PERC, unlikely DVT/PE. Cardiology consulted and evaluated pt.  UDS sent per recommendation. Pt has scheduled Cardiology f/u in 3 days. He will likely undergo CT coronary study at that time. Pt updated on findings/plan and without  additional questions.  Return precautions provided for worsening symptoms. Pt will f/u with PCP at first availability. Pt verbalized agreement with plan.  Pt care d/w Dr. Anitra Lauth  Final Clinical Impressions(s) / ED Diagnoses   Final diagnoses:  Chest pain, unspecified type  Cocaine abuse    New Prescriptions Discharge Medication List as of 02/17/2017  6:58 PM       Shion Bluestein, Homero Fellers, MD 02/18/17 1610    Gwyneth Sprout, MD 02/18/17 1422

## 2017-02-17 NOTE — Discharge Instructions (Addendum)
Please followup with Cardiology on Friday 9/28.

## 2017-02-17 NOTE — Consult Note (Signed)
Cardiology Consultation:   Patient ID: Ronnie [name]; [id number]; 10-12-1975   Admit date: 02/17/2017 Date of Consult: 02/17/2017  Primary Care Provider: Veryl Speak, FNP Primary Cardiologist: Dr. Allyson Sabal Primary Electrophysiologist:  Not applicable  Patient Profile:   Ronnie [name] is a 41 y.o. male with a hx of cocaine abuse who is being seen today for the evaluation of chest pain at the request of MU, Homero Fellers, MD.  History of Present Illness:   [name] is a 41 year old male who has been followed by Dr. Allyson Sabal for nonischemic cardiomyopathy. The patient has a history of tobacco alcohol and cocaine abuse he was admitted with a pneumonia loss of consciousness in May of this year and was briefly intubated, initial echocardiogram showed LVEF 35-40% with diffuse hypokinesis. He was started on carvedilol and lisinopril and follow-up echocardiogram on 02/02/2017 showed normal LVEF. Today he presents with complaint of chest pain, presenting today with symptoms concerning for exertional angina. Patient works in a [employer] and states while he was working he was having a burning discomfort in the left side of his chest that radiated into his mid back and down the back. He also complained of feeling short of breath while this was occurring. He felt slightly lightheaded but denies any syncope during the event. No vomiting, diaphoresis or nausea.  After resting the pain resolved when he tried to go back to work the pain returned. It was then he decided to come to the ER. Currently he is pain-free. He denies taking aspirin every day.  He states that his pain was originally pressure-like associated shortness of breath but after a while felt like burning in his left shoulder. He denies any palpitations or syncope.  Past Medical History:  Diagnosis Date  . Hypertension   . Substance abuse     Past Surgical History:  Procedure Laterality Date  . OTHER SURGICAL HISTORY     No surgical history      Home Medications:  Prior to Admission medications   Medication Sig Start Date End Date Taking? Authorizing Provider  candesartan (ATACAND) 16 MG tablet Take 1 tablet (16 mg total) by mouth daily. 01/02/17  Yes Runell Gess, MD  carvedilol (COREG) 12.5 MG tablet Take 1 tablet (12.5 mg total) by mouth 2 (two) times daily with a meal. 01/02/17  Yes Runell Gess, MD  folic acid (FOLVITE) 1 MG tablet Take 1 tablet (1 mg total) by mouth daily. Patient not taking: Reported on 02/17/2017 09/17/16   Albertine Grates, MD    Inpatient Medications: Scheduled Meds:  Continuous Infusions:  PRN Meds:  Allergies:    Allergies  Allergen Reactions  . Lisinopril Cough   Social History:   Social History   Social History  . Marital status: Married    Spouse name: N/A  . Number of children: 1  . Years of education: 28   Occupational History  . Skilled Laborer    Social History Main Topics  . Smoking status: Current Every Day Smoker    Packs/day: 0.50    Years: 25.00    Types: Cigarettes  . Smokeless tobacco: Never Used  . Alcohol use 3.6 oz/week    6 Cans of beer per week     Comment: occ  . Drug use: Yes    Types: Cocaine  . Sexual activity: Yes    Partners: Female    Birth control/ protection: None   Other Topics Concern  . Not on file   Social History  Narrative   Fun/Hobby: Fishing    Patient lives with girlfriend. Alcohol drinker and cocaine user.     Family History:    Family History  Problem Relation Age of Onset  . Heart disease Mother   . Heart disease Maternal Grandmother     ROS:  Please see the history of present illness.  ROS  All other ROS reviewed and negative.     Physical Exam/Data:   Vitals:   02/17/17 1130 02/17/17 1600 02/17/17 1700  BP: (!) 180/116 (!) 155/111 (!) 145/104  Pulse: 68 72 68  Resp: Temp: 97.9 F (36.6 C)    TempSrc: Oral    SpO2: 100% 99% 100%  Weight: 155 lb (70.3 kg)    Height: 6' (1.829 m)     No intake or  output data in the 24 hours ending 02/17/17 1728 Filed Weights   02/17/17 1130  Weight: 155 lb (70.3 kg)   Body mass index is 21.02 kg/m.  General:  Well nourished, well developed, in no acute distress HEENT: normal Lymph: no adenopathy Neck: no JVD Endocrine:  No thryomegaly Vascular: No carotid bruits; FA pulses 2+ bilaterally without bruits  Cardiac:  normal S1, S2; RRR; no murmur  Lungs:  clear to auscultation bilaterally, no wheezing, rhonchi or rales  Abd: soft, nontender, no hepatomegaly  Ext: no edema Musculoskeletal:  No deformities, BUE and BLE strength normal and equal Skin: warm and dry  Neuro:  CNs 2-12 intact, no focal abnormalities noted Psych:  Normal affect   EKG:  The EKG was personally reviewed and demonstrates:  Sinus rhythm, 74 bpm, LVH, otherwise normal EKG unchanged from prior. Telemetry:  Telemetry was personally reviewed and demonstrates:  Sinus rhythm  Laboratory Data:  Chemistry Recent Labs Lab 02/17/17 1128  NA 138  K 4.0  CL 105  CO2 28  GLUCOSE 111*  BUN 9  CREATININE 1.17  CALCIUM 9.6  GFRNONAA >60  GFRAA >60  ANIONGAP 5    No results for input(s): PROT, ALBUMIN, AST, ALT, ALKPHOS, BILITOT in the last 168 hours. Hematology Recent Labs Lab 02/17/17 1128  WBC 7.1  RBC 4.06*  HGB 12.8*  HCT 37.6*  MCV 92.6  MCH 31.5  MCHC 34.0  RDW 14.0  PLT 198   Cardiac EnzymesNo results for input(s): TROPONINI in the last 168 hours.  Recent Labs Lab 02/17/17 1149  TROPIPOC 0.00    BNPNo results for input(s): BNP, PROBNP in the last 168 hours.  DDimer No results for input(s): DDIMER in the last 168 hours.  Radiology/Studies:  Dg Chest 2 View  Result Date: 02/17/2017 CLINICAL DATA:  Acute onset mid chest pain radiating into the left chest associated with shortness of breath. EXAM: CHEST  2 VIEW COMPARISON:  09/16/2016, 09/12/2016, 09/11/2016. FINDINGS: Cardiac silhouette upper normal in size, unchanged. Hilar and mediastinal contours  unremarkable. Mild central peribronchial thickening, unchanged. Lungs otherwise clear. No localized airspace consolidation. No pleural effusions. No pneumothorax. Normal pulmonary vascularity. Visualized bony thorax intact. IMPRESSION: Stable mild changes of chronic bronchitis and/or asthma. No acute cardiopulmonary disease. Electronically Signed   By: Hulan Saas M.D.   On: 02/17/2017 11:44   TTE: 09/13/2016 - Left ventricle: The cavity size was normal. Wall thickness was   normal. Systolic function was moderately reduced. The estimated   ejection fraction was in the range of 35% to 40%. Diffuse   hypokinesis. Doppler parameters are consistent with abnormal left   ventricular relaxation (grade 1 diastolic dysfunction). -  Mitral valve: There was mild regurgitation. - Left atrium: The atrium was moderately dilated. - Pericardium, extracardiac: A trivial pericardial effusion was   identified.  Impressions: - Moderate global reduction in LV systolic function; grade 1   diastolic dysfunction; mild MR; moderate LAE; mild TR.  TTE: 02/02/2017 - Left ventricle: The cavity size was normal. Posterior wall   thickness was increased in a pattern of mild LVH. Systolic   function was normal. Wall motion was normal; there were no   regional wall motion abnormalities. Left ventricular diastolic   function parameters were normal. - Aortic valve: Transvalvular velocity was within the normal range.   There was no stenosis. There was no regurgitation. - Mitral valve: Transvalvular velocity was within the normal range.   There was no evidence for stenosis. There was trivial   regurgitation. - Left atrium: The atrium was severely dilated. - Right ventricle: The cavity size was normal. Wall thickness was   normal. Systolic function was normal. - Atrial septum: No defect or patent foramen ovale was identified. - Tricuspid valve: There was trivial regurgitation. - Pulmonary arteries: Systolic pressure  was within the normal   range. PA peak pressure: 17 mm Hg (S).    Assessment and Plan:   1. Chest pain - The patient admits to use cocaine a week ago, today he had exertional chest pain but currently is chest pain-free. His baseline EKG shows LVH but otherwise no acute changes, first troponin is normal. His blood pressure is elevated. He was off for a coronary CTA to evaluate for coronary artery disease however he insists that he has to go home tonight. ER physician was advised to collect second troponin and if normal he can be discharged home. He has an appointment with Dr. Allyson Sabal this Friday where they can schedule an outpatient coronary CTA to further evaluate. Doubt this is aortic dissection given that he is currently chest pain-free. 2. History of cardiomyopathy - patient is advised to use carvedilol and candesartan. If titration needed this can be done on Friday in his outpatient visit. 3. Cocaine abuse - strongly advised to quit   For questions or updates, please contact [facility] Please consult [email or web address] for contact info under Cardiology/STEMI.   Signed, Tobias Alexander, MD  02/17/2017 5:28 PM

## 2017-02-17 NOTE — ED Triage Notes (Signed)
Pt was at work and started getting chest pains and hurting on sides and back on the left side. Pt reports sob. Pt sees a cardiologist because he has a low EF.

## 2017-02-20 ENCOUNTER — Ambulatory Visit (INDEPENDENT_AMBULATORY_CARE_PROVIDER_SITE_OTHER): Payer: BLUE CROSS/BLUE SHIELD | Admitting: Cardiovascular Disease

## 2017-02-20 ENCOUNTER — Encounter: Payer: Self-pay | Admitting: Cardiovascular Disease

## 2017-02-20 VITALS — BP 114/86 | HR 100 | Ht 72.0 in | Wt 146.0 lb

## 2017-02-20 DIAGNOSIS — I10 Essential (primary) hypertension: Secondary | ICD-10-CM | POA: Diagnosis not present

## 2017-02-20 DIAGNOSIS — F17200 Nicotine dependence, unspecified, uncomplicated: Secondary | ICD-10-CM

## 2017-02-20 DIAGNOSIS — R079 Chest pain, unspecified: Secondary | ICD-10-CM | POA: Diagnosis not present

## 2017-02-20 MED ORDER — CANDESARTAN CILEXETIL 16 MG PO TABS: 32.0000 mg | ORAL_TABLET | Freq: Every day | ORAL | 2 refills | Status: DC

## 2017-02-20 NOTE — Assessment & Plan Note (Signed)
History reduced EF by 2-D echo back in July with improvement by 2-D echo 02/02/17 to normal ejection fraction was severely dilated left atrium as result of optimization of his cardiac medications.

## 2017-02-20 NOTE — Progress Notes (Signed)
02/20/2017 Malvin Rolfe   1975/08/15  [id number]  Primary Physician Veryl Speak, FNP Primary Cardiologist: Runell Gess MD Roseanne Reno  HPI:  Dmani Mizer is a 41 y.o. male  single thin-appearing African-American American male with no children is accompanied by his brother.He is referred by Overton Mam, FNP, for evaluation and treatment of cardiomyopathy. I last saw him in the office 11/28/16. He has a history of tobacco abuse, alcohol abuse and cocaine abuse. 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 He was recently admitted with loss of consciousness and probably pneumonia and briefly intubated. 2-D echo revealed EF of 35-40% with diffuse hypokinesia he really does not have symptoms of congestive heart failure and occasionally gets palpitations and atypical chest pain. I began him on carvedilol and have been titrating his dose as an outpatient. Recent 2-D echocardiogram performed 02/02/17 revealed normalization of his LV function although he does have a severely dilated left atrium. He was seen in the emergency room by Dr. Delton See on 02/17/17 with chest pain and went home AGAINST MEDICAL ADVICE. He did admit to using cocaine a week before. He continues to smoke and drink alcohol as well.   Current Meds  Medication Sig  . candesartan (ATACAND) 16 MG tablet Take 2 tablets (32 mg total) by mouth daily.  . carvedilol (COREG) 12.5 MG tablet Take 1 tablet (12.5 mg total) by mouth 2 (two) times daily with a meal.  . folic acid (FOLVITE) 1 MG tablet Take 1 tablet (1 mg total) by mouth daily.  . [DISCONTINUED] candesartan (ATACAND) 16 MG tablet Take 1 tablet (16 mg total) by mouth daily.     Allergies  Allergen Reactions  . Lisinopril Cough    Social History   Social History  . Marital status: Significant Other    Spouse name: N/A  . Number of children: 1  . Years of education: 100   Occupational History  . Skilled Laborer    Social History Main Topics  .  Smoking status: Current Every Day Smoker    Packs/day: 0.50    Years: 25.00    Types: Cigarettes  . Smokeless tobacco: Never Used  . Alcohol use 3.6 oz/week    6 Cans of beer per week     Comment: occ  . Drug use: Yes    Types: Cocaine  . Sexual activity: Yes    Partners: Female    Birth control/ protection: None   Other Topics Concern  . Not on file   Social History Narrative   Fun/Hobby: Fishing    Patient lives with girlfriend. Alcohol drinker and cocaine user.      Review of Systems: General: negative for chills, fever, night sweats or weight changes.  Cardiovascular: negative for chest pain, dyspnea on exertion, edema, orthopnea, palpitations, paroxysmal nocturnal dyspnea or shortness of breath Dermatological: negative for rash Respiratory: negative for cough or wheezing Urologic: negative for hematuria Abdominal: negative for nausea, vomiting, diarrhea, bright red blood per rectum, melena, or hematemesis Neurologic: negative for visual changes, syncope, or dizziness All other systems reviewed and are otherwise negative except as noted above.    Blood pressure 114/86, pulse 100, height 6' (1.829 m), weight 146 lb (66.2 kg).  General appearance: alert and no distress Neck: no adenopathy, no carotid bruit, no JVD, supple, symmetrical, trachea midline and thyroid not enlarged, symmetric, no tenderness/mass/nodules Lungs: clear to auscultation bilaterally Heart: regular rate and rhythm, S1, S2 normal, no murmur, click, rub  or gallop Extremities: extremities normal, atraumatic, no cyanosis or edema Pulses: 2+ and symmetric Skin: Skin color, texture, turgor normal. No rashes or lesions Neurologic: Alert and oriented X 3, normal strength and tone. Normal symmetric reflexes. Normal coordination and gait  EKG not performed today  ASSESSMENT AND PLAN:   Smoker History of continued tobacco abuse recalcitrant risk factor modification.  Cocaine abuse History of intake of  cocaine abuse as recently as a week ago. I did warn him about the interaction of carvedilol and cocaine potentially.  Decreased cardiac ejection fraction History reduced EF by 2-D echo back in July with improvement by 2-D echo 02/02/17 to normal ejection fraction was severely dilated left atrium as result of optimization of his cardiac medications.  Essential hypertension History of essential hypertension blood pressure 140/90. He is on Atacand and carvedilol. I'm going to increase his attic him from 16-30 mg a day and we'll have him see Baxter Hire, Pharm D, for carvedilol titration.      Runell Gess MD FACP,FACC,FAHA, FSCAI 02/20/2017 2:10 PM

## 2017-02-20 NOTE — Patient Instructions (Signed)
Medication Instructions: Increase Atacand to 32 mg daily.   Labwork: Your physician recommends that you return for a FASTING lipid profile and hepatic function panel.   Testing/Procedures: Coronary CT Angiography (CTA), is a special type of CT scan that uses a computer to produce multi-dimensional views of major blood vessels throughout the body. In CT angiography, a contrast material is injected through an IV to help visualize the blood vessels.  Follow-Up: Your physician recommends that you schedule a follow-up appointment with PharmD for medication titration.  Your physician recommends that you schedule a follow-up appointment with Dr. Allyson Sabal after testing.  If you need a refill on your cardiac medications before your next appointment, please call your pharmacy.

## 2017-02-20 NOTE — Assessment & Plan Note (Signed)
History of intake of cocaine abuse as recently as a week ago. I did warn him about the interaction of carvedilol and cocaine potentially.

## 2017-02-20 NOTE — Assessment & Plan Note (Signed)
History of essential hypertension blood pressure 140/90. He is on Atacand and carvedilol. I'm going to increase his attic him from 16-30 mg a day and we'll have him see Baxter Hire, Pharm D, for carvedilol titration.

## 2017-02-20 NOTE — Assessment & Plan Note (Signed)
History of continued tobacco abuse recalcitrant risk factor modification 

## 2017-03-03 ENCOUNTER — Encounter: Payer: Self-pay | Admitting: Cardiovascular Disease

## 2017-03-04 ENCOUNTER — Other Ambulatory Visit: Payer: Self-pay | Admitting: Cardiovascular Disease

## 2017-03-04 DIAGNOSIS — N179 Acute kidney failure, unspecified: Secondary | ICD-10-CM

## 2017-03-13 NOTE — Progress Notes (Deleted)
     03/13/2017 Ronnie [name] 1976-01-26 [id number]   HPI:  Ronnie [name] is a 41 y.o. male patient of Dr ***, with a PMH below who presents today for hypertension clinic evaluation.  Blood Pressure Goal:  130/80  Current Medications:  Cardiac Hx:  Family Hx:  Social Hx:  Diet:  Exercise:  Home BP readings:  Intolerances:   CrCl cannot be calculated (Patient's most recent lab result is older than the maximum 21 days allowed.).  Wt Readings from Last 3 Encounters:  02/20/17 146 lb (66.2 kg)  02/17/17 155 lb (70.3 kg)  12/26/16 149 lb (67.6 kg)   BP Readings from Last 3 Encounters:  02/20/17 114/86  02/17/17 (!) 151/108  01/02/17 140/90   Pulse Readings from Last 3 Encounters:  02/20/17 100  02/17/17 72  01/02/17 70    Current Outpatient Prescriptions  Medication Sig Dispense Refill  . candesartan (ATACAND) 16 MG tablet Take 2 tablets (32 mg total) by mouth daily. 30 tablet 2  . carvedilol (COREG) 12.5 MG tablet Take 1 tablet (12.5 mg total) by mouth 2 (two) times daily with a meal. 60 tablet 2  . folic acid (FOLVITE) 1 MG tablet Take 1 tablet (1 mg total) by mouth daily. 30 tablet 0   No current facility-administered medications for this visit.     Allergies  Allergen Reactions  . Lisinopril Cough    Past Medical History:  Diagnosis Date  . Hypertension   . Substance abuse     There were no vitals taken for this visit.  No problem-specific Assessment & Plan notes found for this encounter.   Phillips Hay PharmD CPP [facility] [address] [place], [place] [address] [phone]

## 2017-03-27 ENCOUNTER — Ambulatory Visit (HOSPITAL_COMMUNITY): Admission: RE | Admit: 2017-03-27 | Payer: BLUE CROSS/BLUE SHIELD | Source: Ambulatory Visit

## 2017-03-27 ENCOUNTER — Telehealth: Payer: Self-pay | Admitting: Cardiovascular Disease

## 2017-03-27 NOTE — Telephone Encounter (Signed)
New Message    Per wife pt is sick and can not make the CT appt for today, please call and reschedule , (appt with [facility] has been Cancelled)

## 2017-03-27 NOTE — Telephone Encounter (Signed)
Message routed to [facility] pool to call patient to r/s coronary CT

## 2017-06-18 ENCOUNTER — Telehealth: Payer: Self-pay | Admitting: Cardiovascular Disease

## 2017-06-18 NOTE — Telephone Encounter (Signed)
Patient called in stating that he wanted to reschedule his CT morph that he cancelled in 11-18.  Staff message sent to Omar Person at [facility] to get scheduled.

## 2017-07-01 ENCOUNTER — Telehealth: Payer: Self-pay | Admitting: Cardiovascular Disease

## 2017-07-01 ENCOUNTER — Other Ambulatory Visit: Payer: Self-pay | Admitting: Cardiovascular Disease

## 2017-07-01 ENCOUNTER — Ambulatory Visit: Payer: BLUE CROSS/BLUE SHIELD | Admitting: Cardiovascular Disease

## 2017-07-01 DIAGNOSIS — R931 Abnormal findings on diagnostic imaging of heart and coronary circulation: Secondary | ICD-10-CM

## 2017-07-01 NOTE — Telephone Encounter (Signed)
Okay to schedule sleep study, follow-up with me after he had his CTA as previously scheduled

## 2017-07-01 NOTE — Telephone Encounter (Signed)
Spoke with pt fiance, Deborah--ok per DPR. Appointment with Dr. Allyson Sabal cancelled for today. Pt has not had CTA yet and is trying to reschedule it. Pt fiance stated they will call to schedule another appt with Dr. Allyson Sabal after CTA is scheduled.   She also stated pt was supposed to have a sleep study done per Dr. Carver Fila (PCP), however Dr. Carver Fila is no longer there and is wondering if Dr. Allyson Sabal would consider ordering the sleep study for pt.  Told pt I would send a message to Dr. Allyson Sabal for advisement and call her back. Pt fiance verbalized understanding and thanks.

## 2017-07-08 ENCOUNTER — Other Ambulatory Visit: Payer: Self-pay | Admitting: Cardiovascular Disease

## 2017-07-08 DIAGNOSIS — R0683 Snoring: Secondary | ICD-10-CM

## 2017-07-08 NOTE — Telephone Encounter (Signed)
Message routed to schedule sleep study and f/u appt for after CTA.

## 2017-07-20 ENCOUNTER — Ambulatory Visit (HOSPITAL_BASED_OUTPATIENT_CLINIC_OR_DEPARTMENT_OTHER): Payer: BLUE CROSS/BLUE SHIELD | Attending: Cardiovascular Disease | Admitting: Cardiovascular Disease

## 2017-07-20 VITALS — Ht 73.0 in | Wt 150.0 lb

## 2017-07-20 DIAGNOSIS — G4733 Obstructive sleep apnea (adult) (pediatric): Secondary | ICD-10-CM | POA: Insufficient documentation

## 2017-07-20 DIAGNOSIS — R0683 Snoring: Secondary | ICD-10-CM | POA: Diagnosis present

## 2017-07-28 ENCOUNTER — Ambulatory Visit (HOSPITAL_COMMUNITY): Payer: BLUE CROSS/BLUE SHIELD

## 2017-07-28 ENCOUNTER — Encounter (HOSPITAL_COMMUNITY): Payer: Self-pay

## 2017-07-28 ENCOUNTER — Ambulatory Visit (HOSPITAL_COMMUNITY)
Admission: RE | Admit: 2017-07-28 | Discharge: 2017-07-28 | Disposition: A | Discharge status: Home / Self Care | Payer: BLUE CROSS/BLUE SHIELD | Source: Ambulatory Visit | Attending: Cardiovascular Disease | Admitting: Cardiovascular Disease

## 2017-07-28 DIAGNOSIS — R931 Abnormal findings on diagnostic imaging of heart and coronary circulation: Secondary | ICD-10-CM | POA: Diagnosis present

## 2017-07-28 DIAGNOSIS — R079 Chest pain, unspecified: Secondary | ICD-10-CM | POA: Diagnosis not present

## 2017-07-28 DIAGNOSIS — Q211 Atrial septal defect: Secondary | ICD-10-CM | POA: Insufficient documentation

## 2017-07-28 MED ORDER — NITROGLYCERIN 0.4 MG SL SUBL
0.8000 mg | SUBLINGUAL_TABLET | Freq: Once | SUBLINGUAL | Status: AC
  Administered 2017-07-28: 0.8 mg via SUBLINGUAL

## 2017-07-28 MED ORDER — METOPROLOL TARTRATE 5 MG/5ML IV SOLN
5.0000 mg | INTRAVENOUS | Status: DC | PRN
  Administered 2017-07-28: 5 mg via INTRAVENOUS

## 2017-07-28 MED ORDER — METOPROLOL TARTRATE 5 MG/5ML IV SOLN
INTRAVENOUS | Status: AC
  Administered 2017-07-28: 5 mg via INTRAVENOUS
  Filled 2017-07-28: qty 10

## 2017-07-28 MED ORDER — IOPAMIDOL (ISOVUE-370) INJECTION 76%
INTRAVENOUS | Status: AC
  Administered 2017-07-28: 80 mL
  Filled 2017-07-28: qty 100

## 2017-07-28 MED ORDER — NITROGLYCERIN 0.4 MG SL SUBL
SUBLINGUAL_TABLET | SUBLINGUAL | Status: AC
  Administered 2017-07-28: 0.8 mg via SUBLINGUAL
  Filled 2017-07-28: qty 2

## 2017-07-28 NOTE — Progress Notes (Signed)
Patient discharged following CT scan of heart.

## 2017-08-02 NOTE — Procedures (Signed)
Patient Name: Ronnie [name], Ronnie [name] Date: 07/20/2017 Gender: Male D.O.B: [birth year] Age (years): 41 Referring Provider: Lorretta Harp Height (inches): 74 Interpreting Physician: Shelva Majestic MD, ABSM Weight (lbs): 150 RPSGT: Laren Everts BMI: 20 MRN: [id number] Neck Size: 15.00  CLINICAL INFORMATION Sleep Study Type: Split Night CPAP  Indication for sleep study: Fatigue, Hypertension, Snoring, Witnessed Apneas  Epworth Sleepiness Score: 3  SLEEP STUDY TECHNIQUE As per the AASM Manual for the Scoring of Sleep and Associated Events v2.3 (April 2016) with a hypopnea requiring 4% desaturations.  The channels recorded and monitored were frontal, central and occipital EEG, electrooculogram (EOG), submentalis EMG (chin), nasal and oral airflow, thoracic and abdominal wall motion, anterior tibialis EMG, snore microphone, electrocardiogram, and pulse oximetry. Continuous positive airway pressure (CPAP) was initiated when the patient met split night criteria and was titrated according to treat sleep-disordered breathing.  MEDICATIONS     candesartan (ATACAND) 16 MG tablet         carvedilol (COREG) 12.5 MG tablet         folic acid (FOLVITE) 1 MG tablet      Medications self-administered by patient taken the night of the study : N/A  RESPIRATORY PARAMETERS Diagnostic Total AHI (/hr): 62.7 RDI (/hr): 75.3 OA Index (/hr): 3.7 CA Index (/hr): 0.0 REM AHI (/hr): 51.4 NREM AHI (/hr): 63.7 Supine AHI (/hr): 66.1 Non-supine AHI (/hr): 0.00 Min O2 Sat (%): 80.0 Mean O2 (%): 95.1 Time below 88% (min): 1.3   Titration Optimal Pressure (cm): 9 AHI at Optimal Pressure (/hr): 0.0 Min O2 at Optimal Pressure (%): 97.0 Supine % at Optimal (%): 100 Sleep % at Optimal (%): 94   SLEEP ARCHITECTURE The recording time for the entire night was 438.3 minutes.  During a baseline period of 203.3 minutes, the patient slept for 129.1 minutes in REM and nonREM, yielding a sleep efficiency of  63.5%%. Sleep onset after lights out was 23.2 minutes with a REM latency of 76.0 minutes. The patient spent 32.1%% of the night in stage N1 sleep, 59.7%% in stage N2 sleep, 0.0%% in stage N3 and 8.1%% in REM.  During the titration period of 225.6 minutes, the patient slept for 173.5 minutes in REM and nonREM, yielding a sleep efficiency of 76.9%%. Sleep onset after CPAP initiation was 9.0 minutes with a REM latency of 36.5 minutes. The patient spent 20.2%% of the night in stage N1 sleep, 61.1%% in stage N2 sleep, 0.0%% in stage N3 and 18.7%% in REM.  CARDIAC DATA The 2 lead EKG demonstrated sinus rhythm. The mean heart rate was 100.0 beats per minute. Other EKG findings include: None.  LEG MOVEMENT DATA The total Periodic Limb Movements of Sleep (PLMS) were 0. The PLMS index was 0.0 .  IMPRESSIONS - Severe obstructive sleep apnea occurred during the diagnostic portion of the study (AHI 62.7/h; RDI 75.3/h; REM AHI 51.4/h). An optimal PAP pressure was selected for this patient ( 9 cm of water) - No significant central sleep apnea occurred during the diagnostic portion of the study (CAI = 0.0/hour). - Significant oxygen desaturation to a nadir of 80% during the diagnostic evaluation. - The patient snored with moderate snoring volume during the diagnostic portion of the study. - No cardiac abnormalities were noted during this study. - Clinically significant periodic limb movements did not occur during sleep.  DIAGNOSIS - Obstructive Sleep Apnea (327.23 [G47.33 ICD-10])  RECOMMENDATIONS - Recommend an initial trial of CPAP therapy with EPR at 9 cm H2O with heated humidification.  A Medium size Resmed Full Face Mask AirFit F20 mask was used for the titration study. - Effort should be made to optimize nasal and oral pharyngeal patency - Avoid alcohol, sedatives and other CNS depressants that may worsen sleep apnea and disrupt normal sleep architecture. - Sleep hygiene should be reviewed to assess  factors that may improve sleep quality. - Weight management and regular exercise should be initiated or continued. - Recommend a download be obtained in 30 days and follow-up sleep clinic evaluation after 4 weeks of therapy.  [Electronically signed] 08/02/2017 01:57 PM  Thomas Kelly MD, FACC, ABSM Diplomate, American Board of Sleep Medicine   NPI: [id number] [facility] PH: [phone]   FX: [phone] ACCREDITED BY THE AMERICAN ACADEMY OF SLEEP MEDICINE 

## 2017-08-03 ENCOUNTER — Telehealth: Payer: Self-pay | Admitting: *Deleted

## 2017-08-05 NOTE — Telephone Encounter (Signed)
Patient's girlfriend Marval Regal given sleep study results and recommendations. ( ok per DPR signed 5/18)

## 2017-08-06 ENCOUNTER — Telehealth: Payer: Self-pay | Admitting: Cardiovascular Disease

## 2017-08-06 NOTE — Progress Notes (Signed)
Patient's girlfriend notified sleep study results and recommendations. (ok per dpr)

## 2017-08-06 NOTE — Telephone Encounter (Signed)
New Message  Pt c/o medication issue:  1. Name of Medication: candesartan (ATACAND) 16 MG tablet  2. How are you currently taking this medication (dosage and times per day)? Take 2 tablets (32 mg total) by mouth daily  3. Are you having a reaction (difficulty breathing--STAT)? no  4. What is your medication issue? Pt states that his medication went from 1 tablet a day to 2 and wants to know why. Please call

## 2017-08-06 NOTE — Telephone Encounter (Signed)
Patient says that he wasn't aware that he was supposed to take 2 (16mg ) atacand. Patient advised that his dose was increased on 02/20/18 by his doctor. Patient advised that any concerns he has about this medication can be discussed at his up coming appointment on 08/21/17. Patient verbalized understanding.

## 2017-08-18 ENCOUNTER — Ambulatory Visit (INDEPENDENT_AMBULATORY_CARE_PROVIDER_SITE_OTHER): Payer: BLUE CROSS/BLUE SHIELD

## 2017-08-18 ENCOUNTER — Encounter (HOSPITAL_COMMUNITY): Payer: Self-pay | Admitting: Family Medicine

## 2017-08-18 ENCOUNTER — Ambulatory Visit (HOSPITAL_COMMUNITY)
Admission: EM | Admit: 2017-08-18 | Discharge: 2017-08-18 | Disposition: A | Discharge status: Home / Self Care | Payer: BLUE CROSS/BLUE SHIELD | Attending: Family Medicine | Admitting: Family Medicine

## 2017-08-18 DIAGNOSIS — S62637A Displaced fracture of distal phalanx of left little finger, initial encounter for closed fracture: Secondary | ICD-10-CM | POA: Diagnosis not present

## 2017-08-18 DIAGNOSIS — S62639A Displaced fracture of distal phalanx of unspecified finger, initial encounter for closed fracture: Secondary | ICD-10-CM

## 2017-08-18 MED ORDER — IBUPROFEN 800 MG PO TABS: 800.0000 mg | ORAL_TABLET | Freq: Three times a day (TID) | ORAL | 0 refills | Status: DC

## 2017-08-18 NOTE — Discharge Instructions (Addendum)
Continue to wear your finger splint until the swelling and pain resolves. Typical healing may take a few weeks.

## 2017-08-18 NOTE — ED Triage Notes (Signed)
Pt here for left pinky finger injury. Reports that he smashed in a machine at work yesterday. He took ibuprofen for pain and didn't help.

## 2017-08-21 ENCOUNTER — Ambulatory Visit: Payer: BLUE CROSS/BLUE SHIELD | Admitting: Cardiovascular Disease

## 2017-08-21 ENCOUNTER — Encounter: Payer: Self-pay | Admitting: Cardiovascular Disease

## 2017-08-21 DIAGNOSIS — I10 Essential (primary) hypertension: Secondary | ICD-10-CM

## 2017-08-21 DIAGNOSIS — F17200 Nicotine dependence, unspecified, uncomplicated: Secondary | ICD-10-CM | POA: Diagnosis not present

## 2017-08-21 NOTE — Assessment & Plan Note (Signed)
Continued tobacco abuse of one pack per day recalcitrant resector modification

## 2017-08-21 NOTE — Assessment & Plan Note (Signed)
History of essential hypertension blood pressure measured at 113/98. He has not taken his antihypertensive medicines today and is experiencing pain from an injured finger. He is on candesartan and carvedilol.

## 2017-08-21 NOTE — Progress Notes (Signed)
08/21/2017 Darrly Knoke   [birth year]  [id number]  Primary Physician Patient, No Pcp Per Primary Cardiologist: Runell Gess MD Nicholes Calamity, [place]  HPI:  Ronnie [name] is a 42 y.o.   single thin-appearing African-American American male with no children is accompanied by his significant other [name] .He is referred by Overton Mam, FNP, for evaluation and treatment of cardiomyopathy. I last saw him in the office 02/20/17. He has a history of tobacco abuse, alcohol abuse and cocaine abuse. 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 He was recently admitted with loss of consciousness and probably pneumonia and briefly intubated. 2-D echo revealed EF of 35-40% with diffuse hypokinesia he really does not have symptoms of congestive heart failure and occasionally gets palpitations and atypical chest pain. I began him on carvedilol and have been titrating his dose as an outpatient. Recent 2-D echocardiogram performed 02/02/17 revealed normalization of his LV function although he does have a severely dilated left atrium. He was seen in the emergency room by Dr. Delton See on 02/17/17 with chest pain and went home AGAINST MEDICAL ADVICE. He did admit to using cocaine a week before. He continues to smoke and drink alcohol as well.he has cut down his cocaine use however. Since I saw him last he has had a coronary CTA performed 07/28/17 that showed a coronary calcium score of 0 with normal coronary arteries.      Current Meds  Medication Sig  . candesartan (ATACAND) 16 MG tablet Take 2 tablets (32 mg total) by mouth daily.  . carvedilol (COREG) 12.5 MG tablet Take 1 tablet (12.5 mg total) by mouth 2 (two) times daily with a meal.  . folic acid (FOLVITE) 1 MG tablet Take 1 tablet (1 mg total) by mouth daily.  Marland Kitchen ibuprofen (ADVIL,MOTRIN) 800 MG tablet Take 1 tablet (800 mg total) by mouth 3 (three) times daily.     Allergies  Allergen Reactions  . Lisinopril Cough    Social History    Socioeconomic History  . Marital status: Significant Other    Spouse name: Not on file  . Number of children: 1  . Years of education: 26  . Highest education level: Not on file  Occupational History  . Occupation: Skilled Laborer  Social Needs  . Financial resource strain: Not on file  . Food insecurity:    Worry: Not on file    Inability: Not on file  . Transportation needs:    Medical: Not on file    Non-medical: Not on file  Tobacco Use  . Smoking status: Current Every Day Smoker    Packs/day: 0.50    Years: 25.00    Pack years: 12.50    Types: Cigarettes  . Smokeless tobacco: Never Used  Substance and Sexual Activity  . Alcohol use: Yes    Alcohol/week: 3.6 oz    Types: 6 Cans of beer per week    Comment: occ  . Drug use: Yes    Types: Cocaine  . Sexual activity: Yes    Partners: Female    Birth control/protection: None  Lifestyle  . Physical activity:    Days per week: Not on file    Minutes per session: Not on file  . Stress: Not on file  Relationships  . Social connections:    Talks on phone: Not on file    Gets together: Not on file    Attends religious service: Not on file    Active member of  club or organization: Not on file    Attends meetings of clubs or organizations: Not on file    Relationship status: Not on file  . Intimate partner violence:    Fear of current or ex partner: Not on file    Emotionally abused: Not on file    Physically abused: Not on file    Forced sexual activity: Not on file  Other Topics Concern  . Not on file  Social History Narrative   Fun/Hobby: Fishing    Patient lives with girlfriend. Alcohol drinker and cocaine user.      Review of Systems: General: negative for chills, fever, night sweats or weight changes.  Cardiovascular: negative for chest pain, dyspnea on exertion, edema, orthopnea, palpitations, paroxysmal nocturnal dyspnea or shortness of breath Dermatological: negative for rash Respiratory: negative  for cough or wheezing Urologic: negative for hematuria Abdominal: negative for nausea, vomiting, diarrhea, bright red blood per rectum, melena, or hematemesis Neurologic: negative for visual changes, syncope, or dizziness All other systems reviewed and are otherwise negative except as noted above.    Blood pressure (!) 113/98, pulse 84, height 6\' 1"  (1.854 m), weight 155 lb (70.3 kg).  General appearance: alert and no distress Neck: no adenopathy, no carotid bruit, no JVD, supple, symmetrical, trachea midline and thyroid not enlarged, symmetric, no tenderness/mass/nodules Lungs: clear to auscultation bilaterally Heart: regular rate and rhythm, S1, S2 normal, no murmur, click, rub or gallop Extremities: extremities normal, atraumatic, no cyanosis or edema Pulses: 2+ and symmetric Skin: Skin color, texture, turgor normal. No rashes or lesions Neurologic: Alert and oriented X 3, normal strength and tone. Normal symmetric reflexes. Normal coordination and gait  EKG not performed today  ASSESSMENT AND PLAN:   Smoker Continued tobacco abuse of one pack per day recalcitrant resector modification  Essential hypertension History of essential hypertension blood pressure measured at 113/98. He has not taken his antihypertensive medicines today and is experiencing pain from an injured finger. He is on candesartan and carvedilol.      Runell Gess MD FACP,FACC,FAHA, [facility] 08/21/2017 4:40 PM

## 2017-08-21 NOTE — Patient Instructions (Signed)
Your physician recommends that you schedule a follow-up appointment in 6 months with a NP/PA.  Dr Allyson Sabal recommends that you schedule a follow-up appointment in 12 months. You will receive a reminder letter in the mail two months in advance. If you don't receive a letter, please call our office to schedule the follow-up appointment.  If you need a refill on your cardiac medications before your next appointment, please call your pharmacy.

## 2017-09-12 ENCOUNTER — Other Ambulatory Visit: Payer: Self-pay | Admitting: Cardiovascular Disease

## 2017-09-17 ENCOUNTER — Other Ambulatory Visit: Payer: Self-pay | Admitting: Family Medicine

## 2017-09-17 ENCOUNTER — Ambulatory Visit: Payer: Self-pay

## 2017-09-17 DIAGNOSIS — M79644 Pain in right finger(s): Secondary | ICD-10-CM

## 2017-10-13 ENCOUNTER — Ambulatory Visit (HOSPITAL_COMMUNITY)
Admission: EM | Admit: 2017-10-13 | Discharge: 2017-10-13 | Disposition: A | Discharge status: Home / Self Care | Payer: BLUE CROSS/BLUE SHIELD | Attending: Family Medicine | Admitting: Family Medicine

## 2017-10-13 ENCOUNTER — Encounter (HOSPITAL_COMMUNITY): Payer: Self-pay | Admitting: Family Medicine

## 2017-10-13 DIAGNOSIS — J069 Acute upper respiratory infection, unspecified: Secondary | ICD-10-CM

## 2017-10-13 DIAGNOSIS — B9789 Other viral agents as the cause of diseases classified elsewhere: Secondary | ICD-10-CM

## 2017-10-13 MED ORDER — AZITHROMYCIN 250 MG PO TABS: ORAL_TABLET | ORAL | 0 refills | Status: AC

## 2017-10-13 MED ORDER — IPRATROPIUM BROMIDE 0.06 % NA SOLN: 2.0000 | Freq: Three times a day (TID) | NASAL | 12 refills | Status: DC

## 2017-10-13 MED ORDER — IBUPROFEN 800 MG PO TABS: 800.0000 mg | ORAL_TABLET | Freq: Three times a day (TID) | ORAL | 0 refills | Status: DC

## 2017-10-13 MED ORDER — BENZONATATE 100 MG PO CAPS: 200.0000 mg | ORAL_CAPSULE | Freq: Three times a day (TID) | ORAL | 0 refills | Status: DC | PRN

## 2017-10-13 NOTE — ED Triage Notes (Signed)
Pt here for cough, body aches, fever, chills since Friday. sts worse over the weekend.

## 2017-10-13 NOTE — Discharge Instructions (Signed)
Push fluids to ensure adequate hydration and keep secretions thin.  Tylenol and/or ibuprofen as needed for pain or fevers.  Nasal spray 2-4 times a day for congestion May continue with mucinex May start antibiotics on Friday if no improvement or worsening of symptoms.

## 2017-10-13 NOTE — ED Provider Notes (Signed)
[facility]    CSN: [id number] Arrival date & time: 10/13/17  1835     History   Chief Complaint Chief Complaint  Patient presents with  . Cough  . Nasal Congestion  . Generalized Body Aches    HPI Ronnie [name] is a 42 y.o. male.   Ronnie [name] presents with complaints of productive cough, congestion, sore throat, sweats which started 4 days ago, at its worst three days ago. States feels mildly improved at this time. Mild shortness of breath. Chest pain  With cough only. Denies gi/gu complaints. Urinating. No history of asthma. Smokes 0.5ppd. No ear pain. No known ill contacts. Hx intubation related to substance abuse and aspiration pneumonia. Does follow with cardiology for decreased ejection fraction as well as htn.     ROS per HPI.      Past Medical History:  Diagnosis Date  . Hypertension   . Substance abuse [facility])     Patient Active Problem List   Diagnosis Date Noted  . Routine adult health maintenance 12/26/2016  . Snoring 12/26/2016  . Decreased cardiac ejection fraction 10/03/2016  . Essential hypertension 10/03/2016  . Ventilator dependent (HCC)   . Encounter for orogastric (OG) tube placement   . Altered mental state 09/11/2016  . Acute kidney injury (HCC) 09/11/2016  . Acute respiratory failure (HCC) 09/11/2016  . Lactic acidosis 09/11/2016  . Smoker 09/11/2016  . ETOH abuse 09/11/2016  . Cocaine abuse (HCC) 09/11/2016    Past Surgical History:  Procedure Laterality Date  . OTHER SURGICAL HISTORY     No surgical history        Home Medications    Prior to Admission medications   Medication Sig Start Date End Date Taking? Authorizing Provider  azithromycin (ZITHROMAX) 250 MG tablet Take 2 tablets (500 mg total) by mouth daily for 1 day, THEN 1 tablet (250 mg total) daily for 4 days. 10/16/17 10/21/17  Georgetta Haber, NP  benzonatate (TESSALON) 100 MG capsule Take 2 capsules (200 mg total) by mouth 3 (three) times daily as needed for  cough. 10/13/17   Georgetta Haber, NP  candesartan (ATACAND) 16 MG tablet Take 2 tablets (32 mg total) by mouth daily. 02/20/17   Runell Gess, MD  carvedilol (COREG) 12.5 MG tablet TAKE 1 TABLET (12.5 MG TOTAL) BY MOUTH 2 (TWO) TIMES DAILY WITH A MEAL. 09/14/17   Runell Gess, MD  folic acid (FOLVITE) 1 MG tablet Take 1 tablet (1 mg total) by mouth daily. 09/17/16   Albertine Grates, MD  ibuprofen (ADVIL,MOTRIN) 800 MG tablet Take 1 tablet (800 mg total) by mouth 3 (three) times daily. 10/13/17   Linus Mako B, NP  ipratropium (ATROVENT) 0.06 % nasal spray Place 2 sprays into both nostrils 3 (three) times daily for 5 days. 10/13/17 10/18/17  Georgetta Haber, NP    Family History Family History  Problem Relation Age of Onset  . Heart disease Mother   . Heart disease Maternal Grandmother     Social History Social History   Tobacco Use  . Smoking status: Current Every Day Smoker    Packs/day: 0.50    Years: 25.00    Pack years: 12.50    Types: Cigarettes  . Smokeless tobacco: Never Used  Substance Use Topics  . Alcohol use: Yes    Alcohol/week: 3.6 oz    Types: 6 Cans of beer per week    Comment: occ  . Drug use: Yes    Types: Cocaine  Allergies   Lisinopril   Review of Systems Review of Systems   Physical Exam Triage Vital Signs ED Triage Vitals  Enc Vitals Group     BP 10/13/17 1911 (!) 156/108     Pulse Rate 10/13/17 1911 81     Resp 10/13/17 1911 18     Temp 10/13/17 1911 98.3 F (36.8 C)     Temp Source 10/13/17 1911 Oral     SpO2 10/13/17 1911 100 %     Weight --      Height --      Head Circumference --      Peak Flow --      Pain Score 10/13/17 1910 5     Pain Loc --      Pain Edu? --      Excl. in GC? --    No data found.  Updated Vital Signs BP (!) 156/108   Pulse 81   Temp 98.3 F (36.8 C) (Oral)   Resp 18   SpO2 100%    Physical Exam  Constitutional: He is oriented to person, place, and time. He appears well-developed and  well-nourished.  HENT:  Head: Normocephalic and atraumatic.  Right Ear: Tympanic membrane, external ear and ear canal normal.  Left Ear: Tympanic membrane, external ear and ear canal normal.  Nose: Rhinorrhea present. Right sinus exhibits no maxillary sinus tenderness and no frontal sinus tenderness. Left sinus exhibits no maxillary sinus tenderness and no frontal sinus tenderness.  Mouth/Throat: Uvula is midline, oropharynx is clear and moist and mucous membranes are normal.  Eyes: Pupils are equal, round, and reactive to light. Conjunctivae are normal.  Neck: Normal range of motion.  Cardiovascular: Normal rate and regular rhythm.  Pulmonary/Chest: Effort normal and breath sounds normal. He has no wheezes. He has no rales.  Occasional congested cough noted; without increased work of breathing; lungs clear   Lymphadenopathy:    He has no cervical adenopathy.  Neurological: He is alert and oriented to person, place, and time.  Skin: Skin is warm and dry.  Vitals reviewed.    UC Treatments / Results  Labs (all labs ordered are listed, but only abnormal results are displayed) Labs Reviewed - No data to display  EKG None  Radiology No results found.  Procedures Procedures (including critical care time)  Medications Ordered in UC Medications - No data to display  Initial Impression / Assessment and Plan / UC Course  I have reviewed the triage vital signs and the nursing notes.  Pertinent labs & imaging results that were available during my care of the patient were reviewed by me and considered in my medical decision making (see chart for details).     Congestion and cough noted. Non toxic in appearance. Afebrile. Symptoms with some improvement. History and physical consistent with viral illness.  Can start antibiotics on Friday if no improvement or worsening as he is a smoker as well. Encourage decrease to quit smoking. Supportive cares recommended. Return precautions  provided. Patient verbalized understanding and agreeable to plan.    Final Clinical Impressions(s) / UC Diagnoses   Final diagnoses:  Viral URI with cough     Discharge Instructions     Push fluids to ensure adequate hydration and keep secretions thin.  Tylenol and/or ibuprofen as needed for pain or fevers.  Nasal spray 2-4 times a day for congestion May continue with mucinex May start antibiotics on Friday if no improvement or worsening of symptoms.    ED  Prescriptions    Medication Sig Dispense Auth. Provider   ipratropium (ATROVENT) 0.06 % nasal spray Place 2 sprays into both nostrils 3 (three) times daily for 5 days. 15 mL Linus Mako B, NP   benzonatate (TESSALON) 100 MG capsule Take 2 capsules (200 mg total) by mouth 3 (three) times daily as needed for cough. 21 capsule Linus Mako B, NP   azithromycin (ZITHROMAX) 250 MG tablet Take 2 tablets (500 mg total) by mouth daily for 1 day, THEN 1 tablet (250 mg total) daily for 4 days. 6 tablet Linus Mako B, NP   ibuprofen (ADVIL,MOTRIN) 800 MG tablet Take 1 tablet (800 mg total) by mouth 3 (three) times daily. 21 tablet Georgetta Haber, NP     Controlled Substance Prescriptions [place] Controlled Substance Registry consulted? Not Applicable   Georgetta Haber, NP 10/13/17 1958

## 2017-10-14 ENCOUNTER — Other Ambulatory Visit: Payer: Self-pay | Admitting: Cardiovascular Disease

## 2017-10-14 NOTE — Telephone Encounter (Signed)
Rx sent to pharmacy   

## 2018-02-17 ENCOUNTER — Ambulatory Visit: Payer: Self-pay | Admitting: Cardiology

## 2018-03-22 ENCOUNTER — Ambulatory Visit: Payer: Self-pay | Admitting: Cardiology

## 2018-03-22 DIAGNOSIS — R0989 Other specified symptoms and signs involving the circulatory and respiratory systems: Secondary | ICD-10-CM

## 2018-03-24 ENCOUNTER — Encounter: Payer: Self-pay | Admitting: *Deleted

## 2018-06-11 IMAGING — DX DG CHEST 2V
2 series · 2 of 2 positions shown · non-contrast
Comparison: 09/16/2016, 09/12/2016, 09/11/2016.

CLINICAL DATA: Acute onset mid chest pain radiating into the left
chest associated with shortness of breath.

EXAM:
CHEST  2 VIEW

[chest pa]
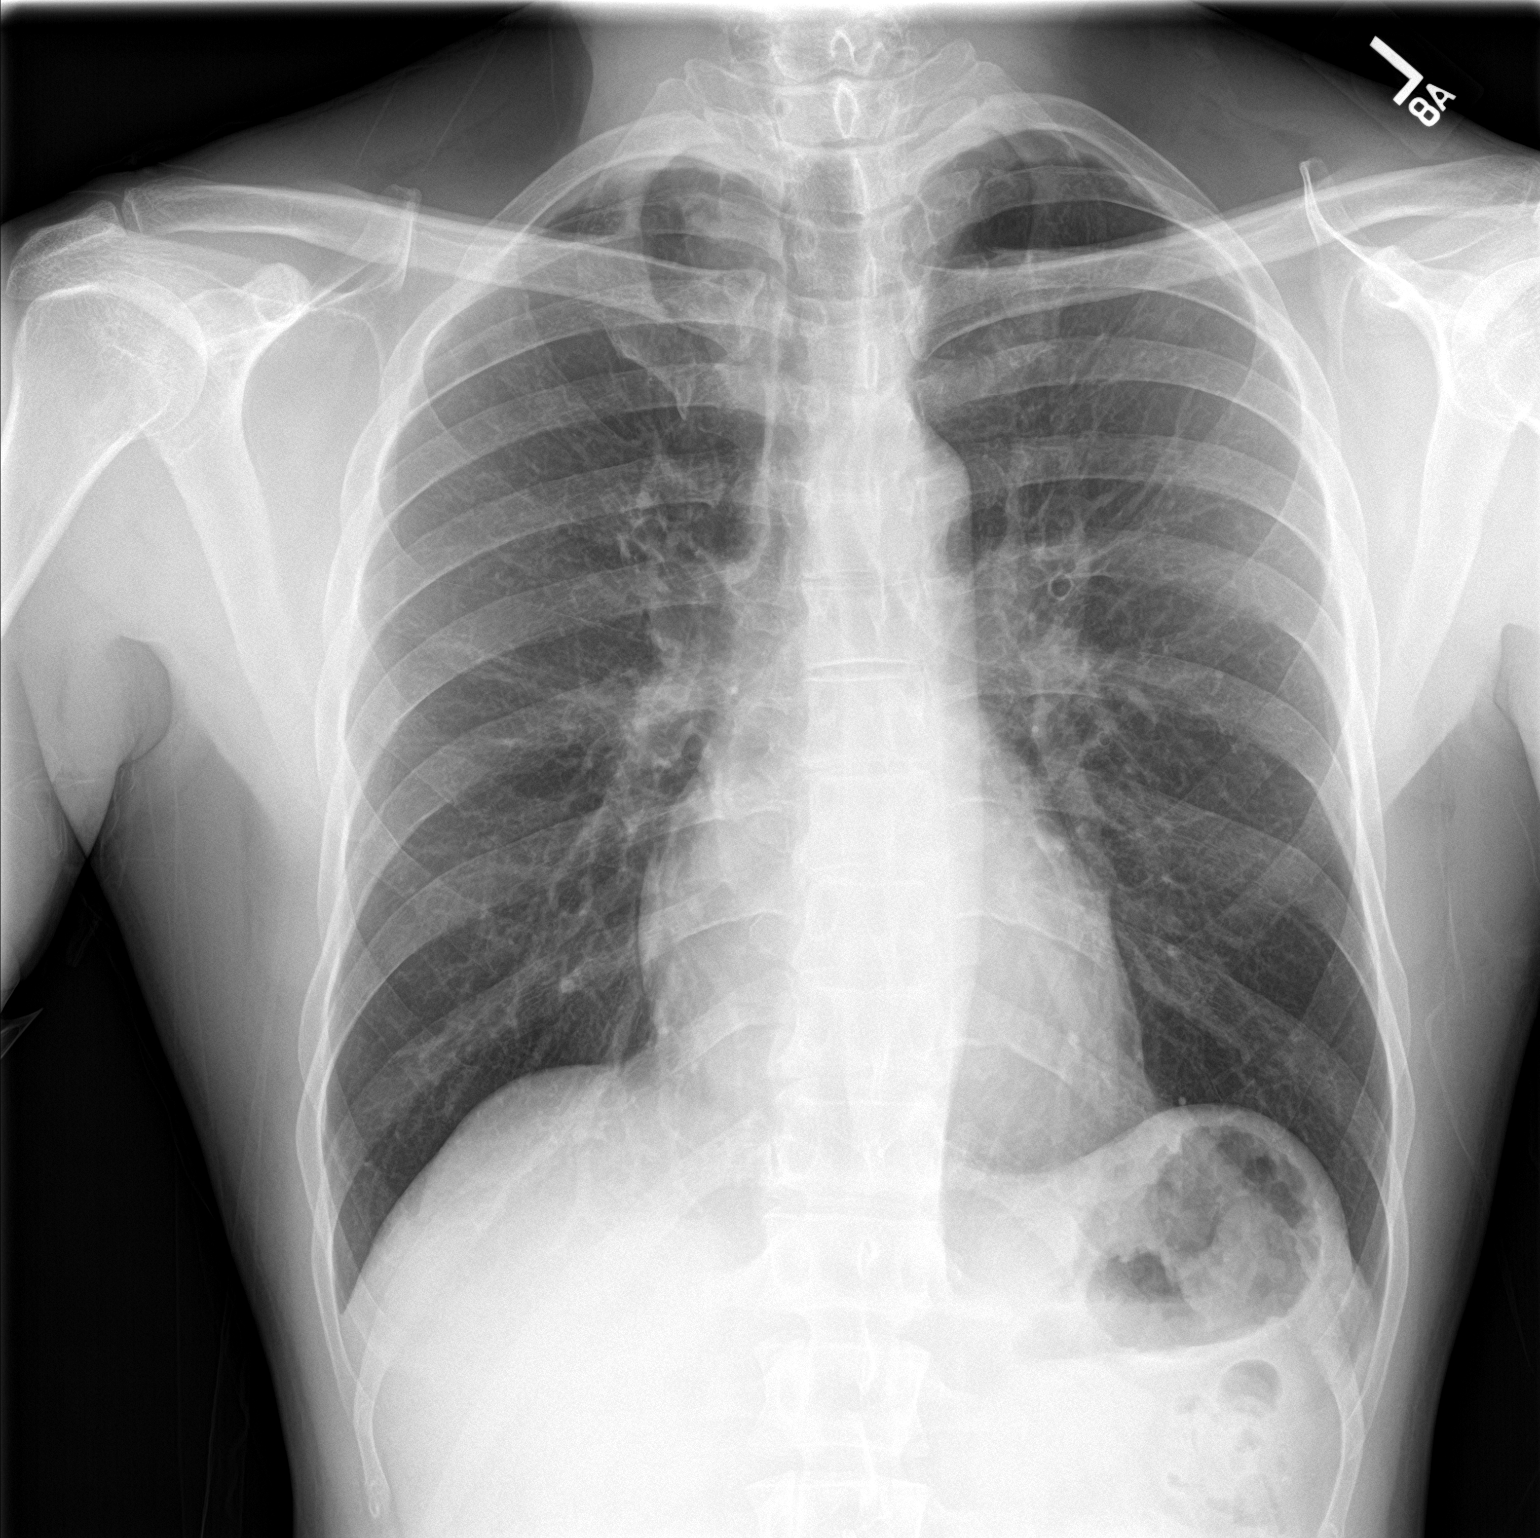

[chest lat]
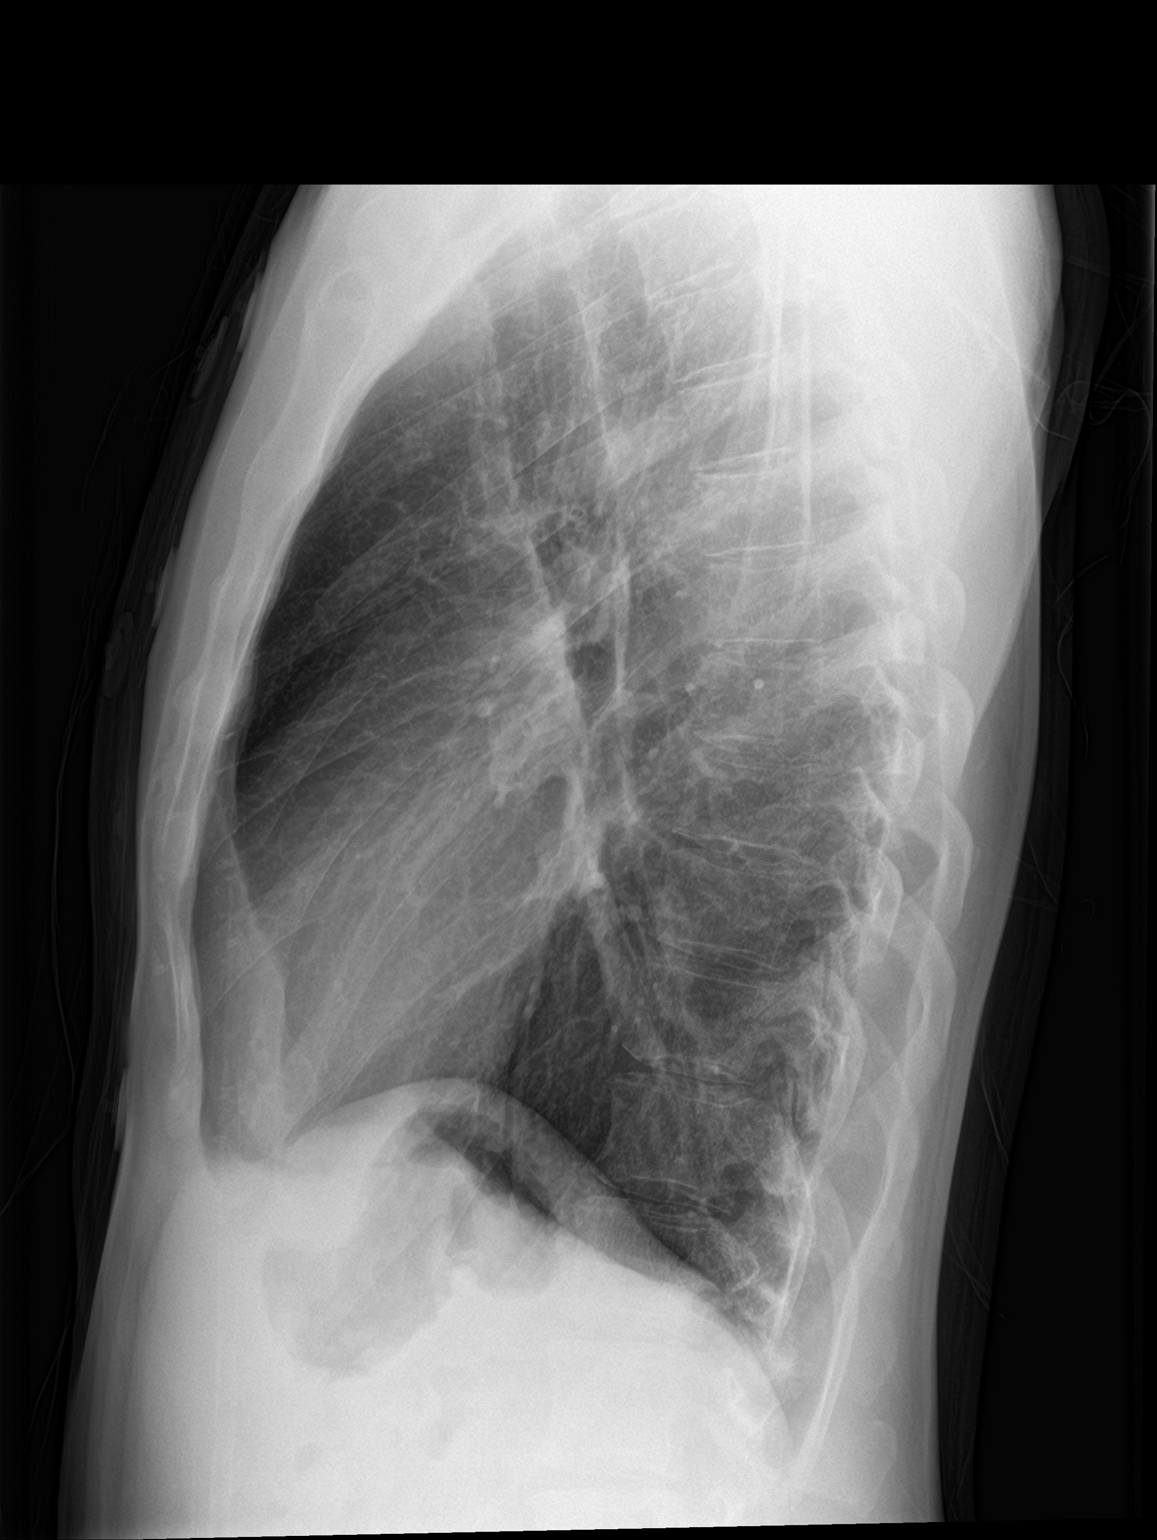

[2 of 2 positions shown; findings below may reference images not displayed]

FINDINGS: Cardiac silhouette upper normal in size, unchanged. Hilar and
mediastinal contours unremarkable. Mild central peribronchial
thickening, unchanged. Lungs otherwise clear. No localized airspace
consolidation. No pleural effusions. No pneumothorax. Normal
pulmonary vascularity. Visualized bony thorax intact.
IMPRESSION: Stable mild changes of chronic bronchitis and/or asthma. No acute
cardiopulmonary disease.

## 2018-09-21 ENCOUNTER — Other Ambulatory Visit: Payer: Self-pay | Admitting: Cardiovascular Disease

## 2018-10-17 ENCOUNTER — Emergency Department (HOSPITAL_COMMUNITY): Payer: BLUE CROSS/BLUE SHIELD

## 2018-10-17 ENCOUNTER — Other Ambulatory Visit: Payer: Self-pay

## 2018-10-17 ENCOUNTER — Encounter (HOSPITAL_COMMUNITY): Payer: Self-pay | Admitting: Emergency Medicine

## 2018-10-17 ENCOUNTER — Emergency Department (HOSPITAL_COMMUNITY)
Admission: EM | Admit: 2018-10-17 | Discharge: 2018-10-17 | Disposition: A | Discharge status: Home / Self Care | Payer: BLUE CROSS/BLUE SHIELD | Attending: Emergency Medicine | Admitting: Emergency Medicine

## 2018-10-17 DIAGNOSIS — Z79899 Other long term (current) drug therapy: Secondary | ICD-10-CM | POA: Insufficient documentation

## 2018-10-17 DIAGNOSIS — N179 Acute kidney failure, unspecified: Secondary | ICD-10-CM

## 2018-10-17 DIAGNOSIS — Z720 Tobacco use: Secondary | ICD-10-CM

## 2018-10-17 DIAGNOSIS — F1490 Cocaine use, unspecified, uncomplicated: Secondary | ICD-10-CM

## 2018-10-17 DIAGNOSIS — I10 Essential (primary) hypertension: Secondary | ICD-10-CM | POA: Insufficient documentation

## 2018-10-17 DIAGNOSIS — R0789 Other chest pain: Secondary | ICD-10-CM

## 2018-10-17 DIAGNOSIS — F17210 Nicotine dependence, cigarettes, uncomplicated: Secondary | ICD-10-CM | POA: Insufficient documentation

## 2018-10-17 LAB — TROPONIN I
Troponin I: <0.03 ng/mL (ref <0.03)
Troponin I: <0.03 ng/mL (ref <0.03)

## 2018-10-17 LAB — CBC
HCT: 41 % (ref 39.0–52.0)
Hemoglobin: 13.8 g/dL (ref 13.0–17.0)
MCH: 31.9 pg (ref 26.0–34.0)
MCHC: 33.7 g/dL (ref 30.0–36.0)
MCV: 94.7 fL (ref 80.0–100.0)
Platelets: 191 10*3/uL (ref 150–400)
RBC: 4.33 MIL/uL (ref 4.22–5.81)
RDW: 13.6 % (ref 11.5–15.5)
WBC: 8.9 10*3/uL (ref 4.0–10.5)
nRBC: 0 % (ref 0.0–0.2)

## 2018-10-17 LAB — BASIC METABOLIC PANEL
Anion gap: 16 — ABNORMAL HIGH (ref 5–15)
BUN: 10 mg/dL (ref 6–20)
CO2: 18 mmol/L — ABNORMAL LOW (ref 22–32)
Calcium: 9.1 mg/dL (ref 8.9–10.3)
Chloride: 108 mmol/L (ref 98–111)
Creatinine, Ser: 1.32 mg/dL — ABNORMAL HIGH (ref 0.61–1.24)
GFR calc Af Amer: >60 mL/min (ref >60)
GFR calc non Af Amer: >60 mL/min (ref >60)
Glucose, Bld: 77 mg/dL (ref 70–99)
Potassium: 3.9 mmol/L (ref 3.5–5.1)
Sodium: 142 mmol/L (ref 135–145)

## 2018-10-17 MED ORDER — SODIUM CHLORIDE 0.9 % IV BOLUS
1000.0000 mL | Freq: Once | INTRAVENOUS | Status: AC
  Administered 2018-10-17: 1000 mL via INTRAVENOUS

## 2018-10-17 MED ORDER — SODIUM CHLORIDE 0.9% FLUSH
3.0000 mL | Freq: Once | INTRAVENOUS | Status: AC
  Administered 2018-10-17: 3 mL via INTRAVENOUS

## 2018-10-17 NOTE — Discharge Instructions (Addendum)
Your work up today has been reassuring. There are many causes of chest pain, including indigestion, muscle pain, rib pain, and other nonemergent causes. It's very important that you stop smoking and stop using cocaine. Use tylenol or motrin as needed for pain, only on a full stomach, never on an empty stomach. Avoid acidic foods, fried foods, or fatty foods. Use over the counter tums/maalox/zantac for indigestion. Stay well hydrated. May use heat to the areas of pain, no more than 20 minutes per hour. Follow up with your cardiologist in 5-7 days for recheck of symptoms and ongoing evaluation of your chest pain. Return to the ER for emergent changes or worsening symptoms.

## 2018-10-17 NOTE — ED Triage Notes (Signed)
C/o sharp L sided chest pain that radiates to back x 1 week.  Pain worse at night.  Not taking meds for 8 months.  Increased stress.  EMS administered ASA 324 mg and NTG SL x 1.

## 2018-10-17 NOTE — ED Notes (Signed)
PT states understanding of care given, follow up care, and medication prescribed. PT is ambulated from ED to car with a steady gait.  

## 2018-10-17 NOTE — ED Provider Notes (Signed)
[facility] EMERGENCY DEPARTMENT Provider Note   CSN: [id number] Arrival date & time: 10/17/18  1544    History   Chief Complaint Chief Complaint  Patient presents with  . Chest Pain    HPI    Ronnie [name] is a 43 y.o. male with a PMHx of HTN, prior reduced EF (normal on echo 01/2017), substance abuse, and other conditions listed below, who presents to the ED with complaints of intermittent chest pain that began about a week ago.  Patient states his symptoms seem to be worse at night, describes his pain as 7/10 intermittent pressure in the left side of his chest that radiates somewhat into the left arm and back area, with no known aggravating factors, unchanged with inspiration or exertion, and with no treatments tried prior to today, and unchanged with ASA and NTGx1.  He states it is mostly constant at night but during the day he does not have it.  He had some CP earlier when he called the ambulance, but currently has no chest pain.  He endorses cocaine use, last use was a few days ago.  He admits to being a cigarette smoker.  He also admits to drinking alcohol last night.  He reports that he stopped taking his carvedilol and candesartan 8 months ago because it had been too long since he went to see his cardiologist Dr. Allyson Sabal so he didn't get refills.  He doesn't have a PCP at this time.  +FHx of MI in several uncles (<50y/o).  He denies any fevers, chills, cough, URI symptoms, diaphoresis, lightheadedness, shortness of breath, leg swelling, recent travel/surgery/immobilization, personal or family history of DVT/PE, abdominal pain, nausea, vomiting, diarrhea, constipation, dysuria, hematuria, numbness, tingling, focal weakness, claudication, orthopnea, weight changes, or any other complaints at this time.  The history is provided by the patient and medical records. No language interpreter was used.  Chest Pain  Associated symptoms: no abdominal pain, no cough, no  diaphoresis, no fever, no nausea, no numbness, no shortness of breath, no vomiting and no weakness     Past Medical History:  Diagnosis Date  . Hypertension   . Substance abuse [facility])     Patient Active Problem List   Diagnosis Date Noted  . Routine adult health maintenance 12/26/2016  . Snoring 12/26/2016  . Decreased cardiac ejection fraction 10/03/2016  . Essential hypertension 10/03/2016  . Ventilator dependent (HCC)   . Encounter for orogastric (OG) tube placement   . Altered mental state 09/11/2016  . Acute kidney injury (HCC) 09/11/2016  . Acute respiratory failure (HCC) 09/11/2016  . Lactic acidosis 09/11/2016  . Smoker 09/11/2016  . ETOH abuse 09/11/2016  . Cocaine abuse (HCC) 09/11/2016    Past Surgical History:  Procedure Laterality Date  . OTHER SURGICAL HISTORY     No surgical history         Home Medications    Prior to Admission medications   Medication Sig Start Date End Date Taking? Authorizing Provider  benzonatate (TESSALON) 100 MG capsule Take 2 capsules (200 mg total) by mouth 3 (three) times daily as needed for cough. 10/13/17   Georgetta Haber, NP  candesartan (ATACAND) 16 MG tablet TAKE 2 TABLETS (32 MG TOTAL) BY MOUTH DAILY. 10/14/17   Runell Gess, MD  carvedilol (COREG) 12.5 MG tablet TAKE 1 TABLET (12.5 MG TOTAL) BY MOUTH 2 (TWO) TIMES DAILY WITH A MEAL. 09/21/18   Runell Gess, MD  folic acid (FOLVITE) 1 MG tablet Take  1 tablet (1 mg total) by mouth daily. 09/17/16   Albertine Grates, MD  ibuprofen (ADVIL,MOTRIN) 800 MG tablet Take 1 tablet (800 mg total) by mouth 3 (three) times daily. 10/13/17   Linus Mako B, NP  ipratropium (ATROVENT) 0.06 % nasal spray Place 2 sprays into both nostrils 3 (three) times daily for 5 days. 10/13/17 10/18/17  Georgetta Haber, NP    Family History Family History  Problem Relation Age of Onset  . Heart disease Mother   . Heart disease Maternal Grandmother     Social History Social History   Tobacco  Use  . Smoking status: Current Every Day Smoker    Packs/day: 0.50    Years: 25.00    Pack years: 12.50    Types: Cigarettes  . Smokeless tobacco: Never Used  Substance Use Topics  . Alcohol use: Yes    Alcohol/week: 6.0 standard drinks    Types: 6 Cans of beer per week    Comment: occ  . Drug use: Yes    Types: Cocaine     Allergies   Lisinopril   Review of Systems Review of Systems  Constitutional: Negative for chills, diaphoresis, fever and unexpected weight change.  HENT: Negative for rhinorrhea and sore throat.   Respiratory: Negative for cough and shortness of breath.   Cardiovascular: Positive for chest pain. Negative for leg swelling.  Gastrointestinal: Negative for abdominal pain, constipation, diarrhea, nausea and vomiting.  Genitourinary: Negative for dysuria and hematuria.  Musculoskeletal: Negative for arthralgias and myalgias.  Skin: Negative for color change.  Allergic/Immunologic: Negative for immunocompromised state.  Neurological: Negative for weakness, light-headedness and numbness.  Psychiatric/Behavioral: Negative for confusion.   All other systems reviewed and are negative for acute change except as noted in the HPI.    Physical Exam Updated Vital Signs BP 123/89 (BP Location: Right Arm)   Pulse (!) 102   Temp 98.9 F (37.2 C) (Oral)   Resp 18   SpO2 98%  Updated VS: BP (!) 133/97   Pulse 80   Temp 98.9 F (37.2 C) (Oral)   Resp 19   SpO2 99%    Physical Exam Vitals signs and nursing note reviewed.  Constitutional:      General: He is not in acute distress.    Appearance: Normal appearance. He is well-developed. He is not toxic-appearing.     Comments: Afebrile, nontoxic, NAD  HENT:     Head: Normocephalic and atraumatic.  Eyes:     General:        Right eye: No discharge.        Left eye: No discharge.     Conjunctiva/sclera: Conjunctivae normal.  Neck:     Musculoskeletal: Normal range of motion and neck supple.   Cardiovascular:     Rate and Rhythm: Normal rate and regular rhythm.     Pulses: Normal pulses.     Heart sounds: Normal heart sounds, S1 normal and S2 normal. No murmur. No friction rub. No gallop.      Comments: HR 80s during exam, reg rhythm, nl s1/s2, no m/r/g, distal pulses intact, no pedal edema, neg homan's bilaterally Pulmonary:     Effort: Pulmonary effort is normal. No respiratory distress.     Breath sounds: Normal breath sounds. No decreased breath sounds, wheezing, rhonchi or rales.     Comments: CTAB in all lung fields, no w/r/r, no hypoxia or increased WOB, speaking in full sentences, SpO2 98% on RA  Chest:  Chest wall: No deformity, tenderness or crepitus.     Comments: Chest wall nonTTP without crepitus, deformities, or retractions  Abdominal:     General: Bowel sounds are normal. There is no distension.     Palpations: Abdomen is soft. Abdomen is not rigid.     Tenderness: There is no abdominal tenderness. There is no right CVA tenderness, left CVA tenderness, guarding or rebound. Negative signs include Murphy's sign and McBurney's sign.  Musculoskeletal: Normal range of motion.     Right lower leg: No edema.     Left lower leg: No edema.  Skin:    General: Skin is warm and dry.     Findings: No rash.  Neurological:     Mental Status: He is alert and oriented to person, place, and time.     Sensory: Sensation is intact. No sensory deficit.     Motor: Motor function is intact.  Psychiatric:        Mood and Affect: Mood and affect normal.        Behavior: Behavior normal.      ED Treatments / Results  Labs (all labs ordered are listed, but only abnormal results are displayed) Labs Reviewed  BASIC METABOLIC PANEL - Abnormal; Notable for the following components:      Result Value   CO2 18 (*)    Creatinine, Ser 1.32 (*)    Anion gap 16 (*)    All other components within normal limits  CBC  TROPONIN I  TROPONIN I    EKG EKG Interpretation   Date/Time:  Sunday Oct 17 2018 15:48:32 EDT Ventricular Rate:  97 PR Interval:  126 QRS Duration: 88 QT Interval:  370 QTC Calculation: 469 R Axis:   69 Text Interpretation:  Normal sinus rhythm Moderate voltage criteria for LVH, may be normal variant Nonspecific T wave abnormality Prolonged QT Abnormal ECG Confirmed by Kennis Carina [id number]) on 10/17/2018 5:39:46 PM   Radiology Dg Chest 2 View  Result Date: 10/17/2018 CLINICAL DATA:  Chest pains for 1/2 weeks. Smoker. EXAM: CHEST - 2 VIEW COMPARISON:  Chest x-ray dated 02/17/2017. FINDINGS: Heart size and mediastinal contours are within normal limits. Mild chronic bronchitic changes stable. Lungs otherwise clear. No pleural effusion or pneumothorax seen. Osseous structures about the chest are unremarkable. IMPRESSION: 1. No active cardiopulmonary disease. No evidence of pneumonia or pulmonary edema. 2. Mild chronic bronchitic changes. Electronically Signed   By: Bary Richard M.D.   On: 10/17/2018 16:56    Echo 01/2017: Study Conclusions - Left ventricle: The cavity size was normal. Posterior wall   thickness was increased in a pattern of mild LVH. Systolic   function was normal. Wall motion was normal; there were no   regional wall motion abnormalities. Left ventricular diastolic   function parameters were normal. - Aortic valve: Transvalvular velocity was within the normal range.   There was no stenosis. There was no regurgitation. - Mitral valve: Transvalvular velocity was within the normal range.   There was no evidence for stenosis. There was trivial   regurgitation. - Left atrium: The atrium was severely dilated. - Right ventricle: The cavity size was normal. Wall thickness was   normal. Systolic function was normal. - Atrial septum: No defect or patent foramen ovale was identified. - Tricuspid valve: There was trivial regurgitation. - Pulmonary arteries: Systolic pressure was within the normal   range. PA peak pressure: 17 mm  Hg (S).   Procedures Procedures (including critical care time)  Medications  Ordered in ED Medications  sodium chloride flush (NS) 0.9 % injection 3 mL (3 mLs Intravenous Given 10/17/18 1844)  sodium chloride 0.9 % bolus 1,000 mL (1,000 mLs Intravenous Bolus from Bag 10/17/18 1844)     Initial Impression / Assessment and Plan / ED Course  I have reviewed the triage vital signs and the nursing notes.  Pertinent labs & imaging results that were available during my care of the patient were reviewed by me and considered in my medical decision making (see chart for details).        43 y.o. male here with 1wk of intermittent CP. None currently. States mostly at night. On exam, no chest wall tenderness, no tachycardia during exam, no hypoxia, clear lung exam, no abdominal tenderness, no pedal edema, neg homan's bilaterally, symmetric pulses. Lips slightly dry. EKG with LVH but otherwise no acute ischemic findings; CXR without acute findings, some mild chronic bronchitic findings; trop neg; CBC WNL; BMP with Cr 1.32, bicarb 18, anion gap 16. Suspect mild dehydration to explain BMP findings. Doubt PE, dissection, etc. Will give fluids and repeat trop at 3hr mark, then reassess. Pt not having CP at this time, declines wanting anything for pain. Discussed case with my attending Dr. Pilar Plate who agrees with plan.   8:27 PM Second trop neg. Pt still pain free. Overall reassuring evaluation, doubt dissection, PE, ACS, or other emergent pathology. Doubt need for further emergent work up. Pt wants to go home, and I feel he is safe for d/c. Advised OTC remedies for symptomatic relief, and f/up with his cardiologist in 5-7 days for recheck and to get restarted on his meds. Strict return precautions advised. I explained the diagnosis and have given explicit precautions to return to the ER including for any other new or worsening symptoms. The patient understands and accepts the medical plan as it's been dictated and  I have answered their questions. Discharge instructions concerning home care and prescriptions have been given. The patient is STABLE and is discharged to home in good condition.    Final Clinical Impressions(s) / ED Diagnoses   Final diagnoses:  Atypical chest pain  Tobacco use  Cocaine use  AKI (acute kidney injury) [facility])    ED Discharge Orders    [address], [place], [place] 10/17/18 2027    Sabas Sous, MD 10/18/18 1334

## 2018-11-19 IMAGING — CT CT HEART MORP W/ CTA COR W/ SCORE W/ CA W/CM &/OR W/O CM
4 of 7 series · 8 of 20 positions shown, 9 images · non-contrast
Comparison: None.

CLINICAL DATA: 41-year-old male with h/o smoking, cocaine abuse,
chest pain and LV dysfunction that has improved to normal.

EXAM:
Cardiac/Coronary  CT
TECHNIQUE: The patient was scanned on a [employer] Force scanner.

[Series 6: best diast 71 % · axial · 0.35mm/px · z∈[-52,+1]mm · 2 of 402 slices shown, 3 images]
[im 134/402  vessel]
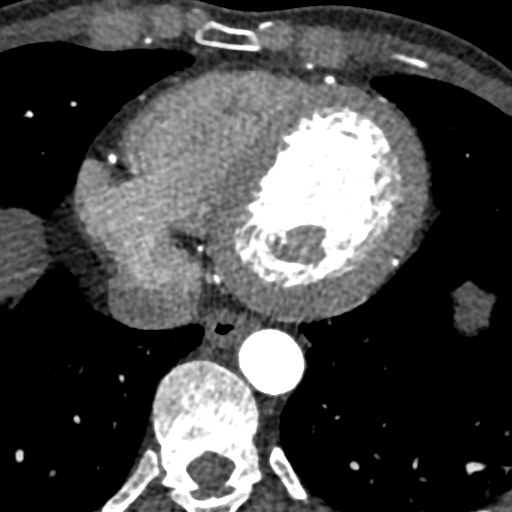
[im 134/402  lung]
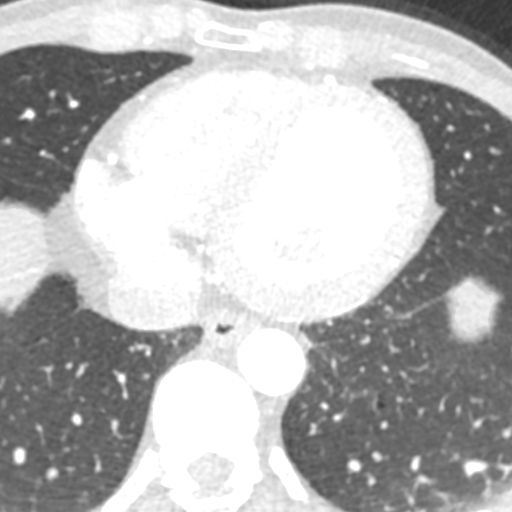
[im 268/402  vessel]
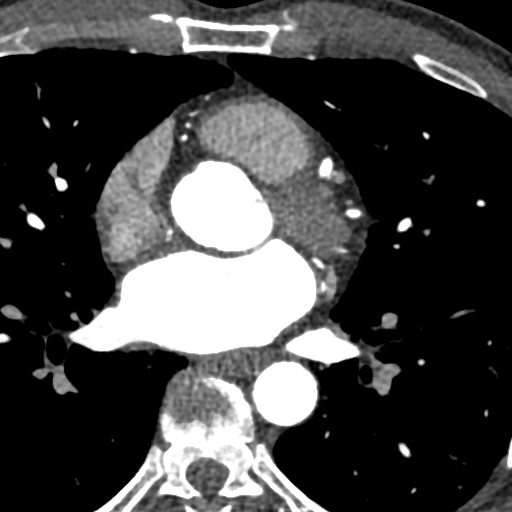

[Series 7: best syst 44 % · axial · 0.35mm/px · z∈[-52,+1]mm · 2 of 402 slices shown]
[im 134/402  vessel]
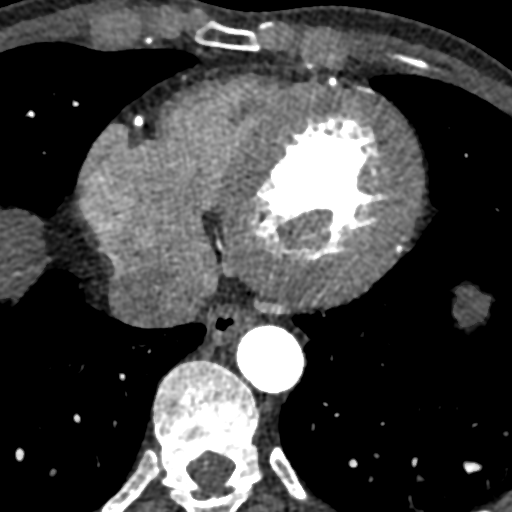
[im 268/402  vessel]
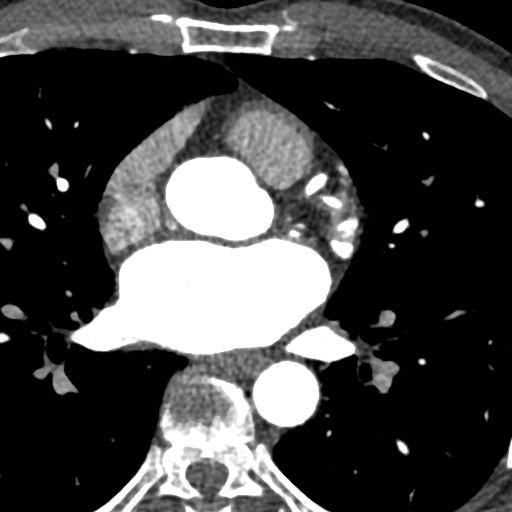

[Series 8: ts diast sharp 71 % · axial · 0.35mm/px · z∈[-52,+1]mm · 2 of 402 slices shown]
[im 134/402  lung]
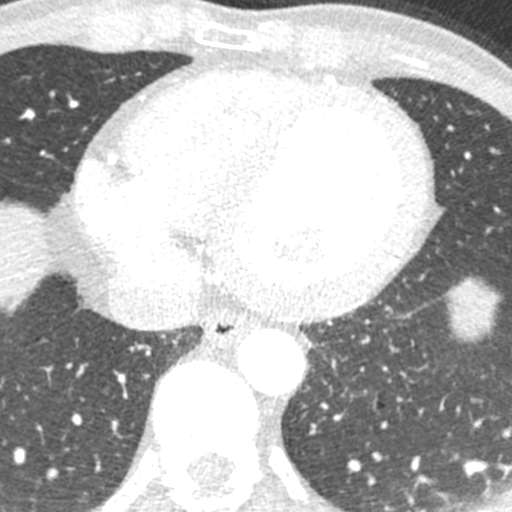
[im 268/402  lung]
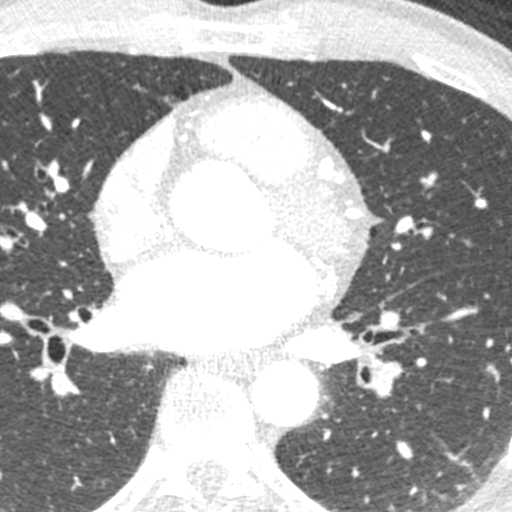

[Series 9: ts syst sharp 44 % · axial · 0.35mm/px · z∈[-52,+1]mm · 2 of 402 slices shown]
[im 134/402  lung]
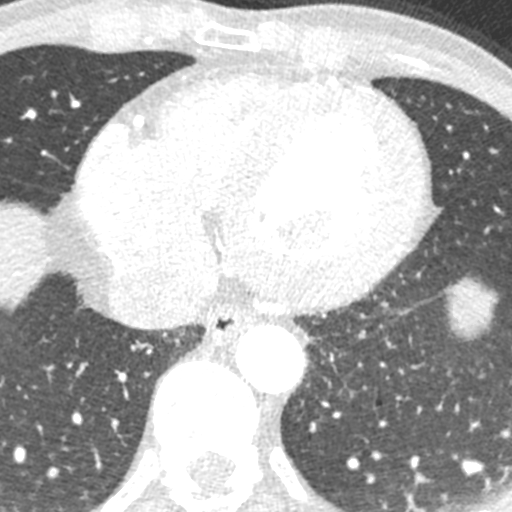
[im 268/402  lung]
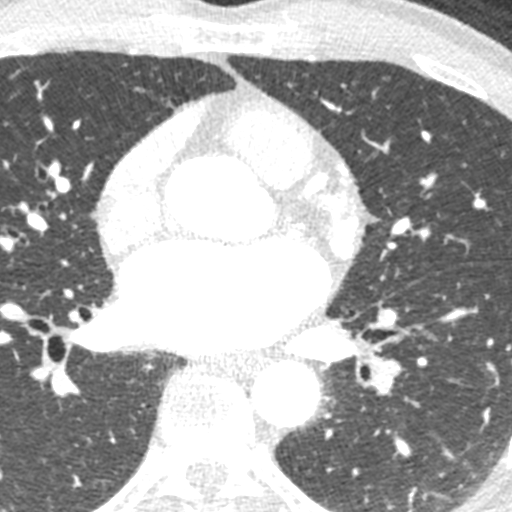

[8 of 20 positions shown; findings below may reference images not displayed]



Aorta:  Normal size.  No calcifications.  No dissection.

Aortic Valve:  Trileaflet.  No calcifications.

Coronary Arteries:  Normal coronary origin.  Right dominance.

RCA is a large dominant artery that gives rise to PDA and PLVB.
There is no plaque.

Left main is a large artery that gives rise to LAD and LCX arteries.

LAD is a large vessel that gives rise to two diagonal arteries and
has no plaque.

LCX is a non-dominant artery that gives rise to one OM1 branch.
There is no plaque.

Other findings:

Normal pulmonary vein drainage into the left atrium.

Normal let atrial appendage without a thrombus.

Normal size of the pulmonary artery.
IMPRESSION: 1. Coronary calcium score of 0. This was 0 percentile for age and
sex matched control.

2. Normal coronary origin with right dominance.

3. No evidence of CAD.

4.  PFO present.

EXAM:
OVER-READ INTERPRETATION  CT CHEST

The following report is an over-read performed by radiologist Dr.
Kiri Jim [[email or web address]] on 07/28/2017. This over-read
does not include interpretation of cardiac or coronary anatomy or
pathology. The cardiac CTA interpretation by the cardiologist is
attached.
FINDINGS: Vascular: Visualized aorta is normal caliber.  Heart is normal size.

Mediastinum/Nodes: No adenopathy in the lower mediastinum or hila.

Lungs/Pleura: Visualized lungs clear.  No effusions.

Upper Abdomen: Imaging into the upper abdomen shows no acute
findings.

Musculoskeletal: Chest wall soft tissues are unremarkable. No acute
bony abnormality.
IMPRESSION: No acute or significant extracardiac abnormality.

## 2019-03-21 ENCOUNTER — Telehealth: Payer: Self-pay | Admitting: *Deleted

## 2019-03-21 NOTE — Telephone Encounter (Signed)
Unable to leave a message, phone is not in service.

## 2019-05-18 ENCOUNTER — Ambulatory Visit: Payer: Self-pay | Admitting: Cardiology

## 2019-05-26 ENCOUNTER — Other Ambulatory Visit: Payer: Self-pay

## 2019-05-26 ENCOUNTER — Encounter: Payer: Self-pay | Admitting: Cardiology

## 2019-05-26 ENCOUNTER — Ambulatory Visit (INDEPENDENT_AMBULATORY_CARE_PROVIDER_SITE_OTHER): Payer: Self-pay | Admitting: Cardiology

## 2019-05-26 DIAGNOSIS — I10 Essential (primary) hypertension: Secondary | ICD-10-CM

## 2019-05-26 DIAGNOSIS — F17200 Nicotine dependence, unspecified, uncomplicated: Secondary | ICD-10-CM

## 2019-05-26 DIAGNOSIS — I517 Cardiomegaly: Secondary | ICD-10-CM

## 2019-05-26 DIAGNOSIS — G4733 Obstructive sleep apnea (adult) (pediatric): Secondary | ICD-10-CM

## 2019-05-26 DIAGNOSIS — I428 Other cardiomyopathies: Secondary | ICD-10-CM

## 2019-05-26 MED ORDER — CARVEDILOL 12.5 MG PO TABS: 12.5000 mg | ORAL_TABLET | Freq: Two times a day (BID) | ORAL | 3 refills | Status: DC

## 2019-05-26 NOTE — Assessment & Plan Note (Signed)
LVH on EKG 

## 2019-05-26 NOTE — Assessment & Plan Note (Signed)
B/P uncontrolled- no medication for a year secondary to loss of insurance.

## 2019-05-26 NOTE — Assessment & Plan Note (Signed)
H/O NICM, his last echo Sept 2018 showed normal LVF

## 2019-05-26 NOTE — Patient Instructions (Addendum)
Medication Instructions:  Coreg 12.5 mg 1 tab twice a day  *If you need a refill on your cardiac medications before your next appointment, please call your pharmacy*  Lab Work: None  If you have labs (blood work) drawn today and your tests are completely normal, you will receive your results only by: Marland Kitchen MyChart Message (if you have MyChart) OR . A paper copy in the mail If you have any lab test that is abnormal or we need to change your treatment, we will call you to review the results.  Testing/Procedures: None  Follow-Up: At [facility], you and your health needs are our priority.  As part of our continuing mission to provide you with exceptional heart care, we have created designated Provider Care Teams.  These Care Teams include your primary Cardiologist (physician) and Advanced Practice Providers (APPs -  Physician Assistants and Nurse Practitioners) who all work together to provide you with the care you need, when you need it.  Your next appointment:   2 week(s)  The format for your next appointment:   In Person  Provider:   Kerin Ransom, PA   Other:  Please decrease your salt intake and follow the [employer] dietary recommendations handout.

## 2019-05-26 NOTE — Progress Notes (Signed)
Cardiology Office Note:    Date:  05/26/2019   ID:  Ronnie [name], DOB 10-17-1975, MRN [id number]  PCP:  Patient, No Pcp Per  Cardiologist:  Dr Gwenlyn Found Electrophysiologist:  None   Referring MD: No ref. provider found   Chief Complaint  Patient presents with  . Follow-up    History of Present Illness:    Ronnie [name] is a 43 y.o. male with a hx of past nonischemic cardiomyopathy.  He was admitted in the spring 2018 with pneumonia and was briefly intubated.  His ejection fraction then was 35 to 40% with diffuse hypokinesis.  Follow-up echocardiogram in September 2018 showed normal LV function.  He was seen in late September 2018 with chest pain.  He left before work-up could be completed.  He did eventually have a coronary CT in March 2019 that showed normal coronaries with a calcium score of 0.  He also had a sleep study in February 2019 that was positive for sleep apnea and CPAP was recommended.  The patient is seen in the office today after he was contacted to set up follow-up.  He has not taken any medication in about a year.  He says he lost his job and could not afford it.  He never did follow through with CPAP, again for the same reasons, financial.  Fortunately he denies any unusual chest pain or shortness of breath.  His blood pressure in the office by me was 152/92.  His EKG shows sinus rhythm with LVH.  Past Medical History:  Diagnosis Date  . Hypertension   . Substance abuse [facility])     Past Surgical History:  Procedure Laterality Date  . OTHER SURGICAL HISTORY     No surgical history     Current Medications: Current Meds  Medication Sig  . benzonatate (TESSALON) 100 MG capsule Take 2 capsules (200 mg total) by mouth 3 (three) times daily as needed for cough.  . candesartan (ATACAND) 16 MG tablet TAKE 2 TABLETS (32 MG TOTAL) BY MOUTH DAILY.  . carvedilol (COREG) 12.5 MG tablet TAKE 1 TABLET (12.5 MG TOTAL) BY MOUTH 2 (TWO) TIMES DAILY WITH A MEAL.  . folic acid  (FOLVITE) 1 MG tablet Take 1 tablet (1 mg total) by mouth daily.  Marland Kitchen ibuprofen (ADVIL,MOTRIN) 800 MG tablet Take 1 tablet (800 mg total) by mouth 3 (three) times daily.     Allergies:   Lisinopril   Social History   Socioeconomic History  . Marital status: Significant Other    Spouse name: Not on file  . Number of children: 1  . Years of education: 49  . Highest education level: Not on file  Occupational History  . Occupation: Skilled Laborer  Tobacco Use  . Smoking status: Current Every Day Smoker    Packs/day: 0.50    Years: 25.00    Pack years: 12.50    Types: Cigarettes  . Smokeless tobacco: Never Used  Substance and Sexual Activity  . Alcohol use: Yes    Alcohol/week: 6.0 standard drinks    Types: 6 Cans of beer per week    Comment: occ  . Drug use: Yes    Types: Cocaine  . Sexual activity: Yes    Partners: Female    Birth control/protection: None  Other Topics Concern  . Not on file  Social History Narrative   Fun/Hobby: Fishing    Patient lives with girlfriend. Alcohol drinker and cocaine user.    Social Determinants of Radio broadcast assistant  Strain:   . Difficulty of Paying Living Expenses: Not on file  Food Insecurity:   . Worried About Programme researcher, broadcasting/film/video in the Last Year: Not on file  . Ran Out of Food in the Last Year: Not on file  Transportation Needs:   . Lack of Transportation (Medical): Not on file  . Lack of Transportation (Non-Medical): Not on file  Physical Activity:   . Days of Exercise per Week: Not on file  . Minutes of Exercise per Session: Not on file  Stress:   . Feeling of Stress : Not on file  Social Connections:   . Frequency of Communication with Friends and Family: Not on file  . Frequency of Social Gatherings with Friends and Family: Not on file  . Attends Religious Services: Not on file  . Active Member of Clubs or Organizations: Not on file  . Attends Banker Meetings: Not on file  . Marital Status: Not on  file     Family History: The patient's family history includes Heart disease in his maternal grandmother and mother.  ROS:   Please see the history of present illness.     All other systems reviewed and are negative.  EKGs/Labs/Other Studies Reviewed:    The following studies were reviewed today: Coronary CT 07/28/2017 Echo Sept 2019  EKG:  EKG is ordered today.  The ekg ordered today demonstrates NSR, HR 94, LVH  Recent Labs: 10/17/2018: BUN 10; Creatinine, Ser 1.32; Hemoglobin 13.8; Platelets 191; Potassium 3.9; Sodium 142  Recent Lipid Panel    Component Value Date/Time   CHOL 178 12/26/2016 1039   TRIG 95.0 12/26/2016 1039   HDL 67.40 12/26/2016 1039   CHOLHDL 3 12/26/2016 1039   VLDL 19.0 12/26/2016 1039   LDLCALC 92 12/26/2016 1039    Physical Exam:    VS:  BP (!) 159/106   Pulse 93   Temp 99.6 F (37.6 C)   Ht 6\' 1"  (1.854 m)   Wt 162 lb 3.2 oz (73.6 kg)   SpO2 97%   BMI 21.40 kg/m     Wt Readings from Last 3 Encounters:  05/26/19 162 lb 3.2 oz (73.6 kg)  08/21/17 155 lb (70.3 kg)  07/20/17 150 lb (68 kg)     GEN: Thin AA male, well developed in no acute distress HEENT: Normal NECK: No JVD; No carotid bruits CARDIAC: RRR, no murmurs, rubs, gallops RESPIRATORY:  Clear to auscultation without rales, wheezing or rhonchi  ABDOMEN: Soft, non-tender, non-distended MUSCULOSKELETAL:  No edema; No deformity  SKIN: Warm and dry NEUROLOGIC:  Alert and oriented x 3 PSYCHIATRIC:  Normal affect   ASSESSMENT:    Essential hypertension B/P uncontrolled- no medication for a year secondary to loss of insurance.   LVH (left ventricular hypertrophy) LVH on EKG  NICM (nonischemic cardiomyopathy) (HCC) H/O NICM, his last echo Sept 2018 showed normal LVF  Sleep apnea Positive sleep study in 2019- not on C-pap  Smoker We discussed cessation  PLAN:    I suggested we add back Coreg 12.5 mg BID.  I'll see him in follow up in two weeks.  Check BMP then, (his  last SCr was 1.32). Consider resuming an ARB then if needed.  Medication Adjustments/Labs and Tests Ordered: Current medicines are reviewed at length with the patient today.  Concerns regarding medicines are outlined above.  No orders of the defined types were placed in this encounter.  No orders of the defined types were placed in this encounter.  Patient Instructions  Medication Instructions:  Coreg 12.5 mg 1 tab twice a day  *If you need a refill on your cardiac medications before your next appointment, please call your pharmacy*  Lab Work: None  If you have labs (blood work) drawn today and your tests are completely normal, you will receive your results only by: Marland Kitchen MyChart Message (if you have MyChart) OR . A paper copy in the mail If you have any lab test that is abnormal or we need to change your treatment, we will call you to review the results.  Testing/Procedures: None  Follow-Up: At [facility], you and your health needs are our priority.  As part of our continuing mission to provide you with exceptional heart care, we have created designated Provider Care Teams.  These Care Teams include your primary Cardiologist (physician) and Advanced Practice Providers (APPs -  Physician Assistants and Nurse Practitioners) who all work together to provide you with the care you need, when you need it.  Your next appointment:   2 week(s)  The format for your next appointment:   In Person  Provider:     Other:  Please decrease your salt intake and follow the [place] dietary recommendations handout.      Signed, Corine Shelter, PA-C  05/26/2019 10:36 AM    [facility]

## 2019-05-26 NOTE — Assessment & Plan Note (Signed)
We discussed cessation

## 2019-05-26 NOTE — Assessment & Plan Note (Signed)
Positive sleep study in 2019- not on C-pap

## 2019-06-02 ENCOUNTER — Ambulatory Visit: Payer: Self-pay | Admitting: Cardiology

## 2019-06-09 ENCOUNTER — Encounter: Payer: Self-pay | Admitting: Cardiology

## 2019-06-09 ENCOUNTER — Ambulatory Visit (INDEPENDENT_AMBULATORY_CARE_PROVIDER_SITE_OTHER): Payer: Managed Care, Other (non HMO) | Admitting: Cardiology

## 2019-06-09 ENCOUNTER — Other Ambulatory Visit: Payer: Self-pay

## 2019-06-09 VITALS — BP 142/90 | HR 88 | Temp 97.3°F | Ht 73.0 in | Wt 162.0 lb

## 2019-06-09 DIAGNOSIS — I428 Other cardiomyopathies: Secondary | ICD-10-CM

## 2019-06-09 DIAGNOSIS — I10 Essential (primary) hypertension: Secondary | ICD-10-CM | POA: Diagnosis not present

## 2019-06-09 NOTE — Assessment & Plan Note (Signed)
B/P pretty well controlled- no change in rx for now but I have asked him to follow his B/P at home.

## 2019-06-09 NOTE — Patient Instructions (Addendum)
Medication Instructions:  Your physician recommends that you continue on your current medications as directed. Please refer to the Current Medication list given to you today. *If you need a refill on your cardiac medications before your next appointment, please call your pharmacy*  Lab Work: Your physician recommends that you return for lab work in: [facility] & BMET If you have labs (blood work) drawn today and your tests are completely normal, you will receive your results only by: Marland Kitchen MyChart Message (if you have MyChart) OR . A paper copy in the mail If you have any lab test that is abnormal or we need to change your treatment, we will call you to review the results.  Testing/Procedures: Your physician has requested that you have an echocardiogram. Echocardiography is a painless test that uses sound waves to create images of your heart. It provides your doctor with information about the size and shape of your heart and how well your heart's chambers and valves are working. This procedure takes approximately one hour. There are no restrictions for this procedure. SCHEDULE TEST IN 3 WEEKS TEST WILL BE COMPLETED AT [address]  Follow-Up: At [facility], you and your health needs are our priority.  As part of our continuing mission to provide you with exceptional heart care, we have created designated Provider Care Teams.  These Care Teams include your primary Cardiologist (physician) and Advanced Practice Providers (APPs -  Physician Assistants and Nurse Practitioners) who all work together to provide you with the care you need, when you need it.  Your next appointment:   4 week(s)  The format for your next appointment:   Virtual Visit   Provider:   Corine Shelter, PA-C  Other Instructions

## 2019-06-09 NOTE — Assessment & Plan Note (Signed)
H/O NICM, his last echo Sept 2018 showed normal LVF He has had some DOE- will check LVF

## 2019-06-09 NOTE — Progress Notes (Signed)
Cardiology Office Note:    Date:  06/09/2019   ID:  Ronnie [name], DOB [birth year], MRN [id number]  PCP:  Patient, No Pcp Per  Cardiologist:  Dr Allyson Sabal Electrophysiologist:  None   Referring MD: No ref. provider found   No chief complaint on file.   History of Present Illness:    Ronnie [name] is a 44 y.o. male with a hx of past nonischemic cardiomyopathy.  He was admitted in the spring 2018 with pneumonia and was briefly intubated.  His ejection fraction then was 35 to 40% with diffuse hypokinesis.  Follow-up echo 02/02/2017 showed normal LV function, mild LVH, severe LAE.    He was seen in the ED late September 2018 with chest pain.  He left before work-up could be completed.  He did eventually have a coronary CT in March 2019 that showed normal coronaries with a calcium score of 0.  He also had a sleep study in February 2019 that was positive for sleep apnea and CPAP was recommended.  The patient was seen in the office 05/26/2019 after he was contacted to set up follow-up.  He had not taken any medication in about a year.  He says he lost his job and could not afford it.  He never did follow through with CPAP, again for the same reasons, financial.  He denied any unusual chest pain or shortness of breath.  His blood pressure in the office by me was 152/92.  His EKG showed sinus rhythm with LVH.  I added Coreg 12.5 mg BID and had him return for follow up. He has a job now working for [employer] and has insurance.  He has been taking his medication.  His B/P by me was 128/82. He does have some DOE but denies orthopnea or edema.    Past Medical History:  Diagnosis Date  . Hypertension   . Substance abuse [facility])     Past Surgical History:  Procedure Laterality Date  . OTHER SURGICAL HISTORY     No surgical history     Current Medications: Current Meds  Medication Sig  . carvedilol (COREG) 12.5 MG tablet Take 1 tablet (12.5 mg total) by mouth 2 (two) times daily.      Allergies:   Lisinopril   Social History   Socioeconomic History  . Marital status: Significant Other    Spouse name: Not on file  . Number of children: 1  . Years of education: 55  . Highest education level: Not on file  Occupational History  . Occupation: Skilled Laborer  Tobacco Use  . Smoking status: Current Every Day Smoker    Packs/day: 0.50    Years: 25.00    Pack years: 12.50    Types: Cigarettes  . Smokeless tobacco: Never Used  Substance and Sexual Activity  . Alcohol use: Yes    Alcohol/week: 6.0 standard drinks    Types: 6 Cans of beer per week    Comment: occ  . Drug use: Yes    Types: Cocaine  . Sexual activity: Yes    Partners: Female    Birth control/protection: None  Other Topics Concern  . Not on file  Social History Narrative   Fun/Hobby: Fishing    Patient lives with girlfriend. Alcohol drinker and cocaine user.    Social Determinants of Health   Financial Resource Strain:   . Difficulty of Paying Living Expenses: Not on file  Food Insecurity:   . Worried About Programme researcher, broadcasting/film/video in  the Last Year: Not on file  . Ran Out of Food in the Last Year: Not on file  Transportation Needs:   . Lack of Transportation (Medical): Not on file  . Lack of Transportation (Non-Medical): Not on file  Physical Activity:   . Days of Exercise per Week: Not on file  . Minutes of Exercise per Session: Not on file  Stress:   . Feeling of Stress : Not on file  Social Connections:   . Frequency of Communication with Friends and Family: Not on file  . Frequency of Social Gatherings with Friends and Family: Not on file  . Attends Religious Services: Not on file  . Active Member of Clubs or Organizations: Not on file  . Attends Archivist Meetings: Not on file  . Marital Status: Not on file     Family History: The patient's family history includes Heart disease in his maternal grandmother and mother.  ROS:   Please see the history of present  illness.     All other systems reviewed and are negative.  EKGs/Labs/Other Studies Reviewed:    The following studies were reviewed today: Echo 02/02/2017 Coronary CT 07/28/2017  Recent Labs: 10/17/2018: BUN 10; Creatinine, Ser 1.32; Hemoglobin 13.8; Platelets 191; Potassium 3.9; Sodium 142  Recent Lipid Panel    Component Value Date/Time   CHOL 178 12/26/2016 1039   TRIG 95.0 12/26/2016 1039   HDL 67.40 12/26/2016 1039   CHOLHDL 3 12/26/2016 1039   VLDL 19.0 12/26/2016 1039   LDLCALC 92 12/26/2016 1039    Physical Exam:    VS:  BP (!) 142/90   Pulse 88   Temp (!) 97.3 F (36.3 C) (Temporal)   Ht 6\' 1"  (1.854 m)   Wt 162 lb (73.5 kg)   SpO2 95%   BMI 21.37 kg/m     Wt Readings from Last 3 Encounters:  06/09/19 162 lb (73.5 kg)  05/26/19 162 lb 3.2 oz (73.6 kg)  08/21/17 155 lb (70.3 kg)     GEN: Well nourished AA male, well developed in no acute distress HEENT: Normal NECK: No JVD; No carotid bruits CARDIAC: RRR, no murmurs, rubs, gallops RESPIRATORY:  Clear to auscultation without rales, wheezing or rhonchi  ABDOMEN: Soft, non-tender, non-distended MUSCULOSKELETAL:  No edema; No deformity  SKIN: Warm and dry NEUROLOGIC:  Alert and oriented x 3 PSYCHIATRIC:  Normal affect   ASSESSMENT:    Essential hypertension B/P pretty well controlled- no change in rx for now but I have asked him to follow his B/P at home.  NICM (nonischemic cardiomyopathy) ([place]) H/O NICM, his last echo Sept 2018 showed normal LVF He has had some DOE- will check LVF  PLAN:    Check echo, check CBC, BMP, follow B/P at home, virtual f/u with me in 4 weeks.   Medication Adjustments/Labs and Tests Ordered: Current medicines are reviewed at length with the patient today.  Concerns regarding medicines are outlined above.  Orders Placed This Encounter  Procedures  . Basic Metabolic Panel (BMET)  . CBC with Differential  . ECHOCARDIOGRAM COMPLETE   No orders of the defined types were  placed in this encounter.   There are no Patient Instructions on file for this visit.   Angelena Form, PA-C  06/09/2019 3:30 PM    [facility]

## 2019-06-10 LAB — BASIC METABOLIC PANEL
BUN/Creatinine Ratio: 9 (ref 9–20)
BUN: 11 mg/dL (ref 6–24)
CO2: 22 mmol/L (ref 20–29)
Calcium: 9.2 mg/dL (ref 8.7–10.2)
Chloride: 108 mmol/L — ABNORMAL HIGH (ref 96–106)
Creatinine, Ser: 1.25 mg/dL (ref 0.76–1.27)
GFR calc Af Amer: 80 mL/min/{1.73_m2} (ref >59)
GFR calc non Af Amer: 69 mL/min/{1.73_m2} (ref >59)
Glucose: 109 mg/dL — ABNORMAL HIGH (ref 65–99)
Potassium: 4.4 mmol/L (ref 3.5–5.2)
Sodium: 144 mmol/L (ref 134–144)

## 2019-06-10 LAB — CBC WITH DIFFERENTIAL/PLATELET
Basophils Absolute: 0.1 10*3/uL (ref 0.0–0.2)
Basos: 1 %
EOS (ABSOLUTE): 0.6 10*3/uL — ABNORMAL HIGH (ref 0.0–0.4)
Eos: 8 %
Hematocrit: 40.3 % (ref 37.5–51.0)
Hemoglobin: 14 g/dL (ref 13.0–17.7)
Immature Grans (Abs): 0 10*3/uL (ref 0.0–0.1)
Immature Granulocytes: 0 %
Lymphocytes Absolute: 3.3 10*3/uL — ABNORMAL HIGH (ref 0.7–3.1)
Lymphs: 43 %
MCH: 32.3 pg (ref 26.6–33.0)
MCHC: 34.7 g/dL (ref 31.5–35.7)
MCV: 93 fL (ref 79–97)
Monocytes Absolute: 0.6 10*3/uL (ref 0.1–0.9)
Monocytes: 7 %
Neutrophils Absolute: 3.2 10*3/uL (ref 1.4–7.0)
Neutrophils: 41 %
Platelets: 194 10*3/uL (ref 150–450)
RBC: 4.34 x10E6/uL (ref 4.14–5.80)
RDW: 13.3 % (ref 11.6–15.4)
WBC: 7.8 10*3/uL (ref 3.4–10.8)

## 2019-06-22 ENCOUNTER — Ambulatory Visit (HOSPITAL_COMMUNITY): Payer: Managed Care, Other (non HMO) | Attending: Cardiology

## 2019-06-22 ENCOUNTER — Other Ambulatory Visit: Payer: Self-pay

## 2019-06-22 DIAGNOSIS — I10 Essential (primary) hypertension: Secondary | ICD-10-CM | POA: Diagnosis not present

## 2019-06-22 DIAGNOSIS — I428 Other cardiomyopathies: Secondary | ICD-10-CM | POA: Diagnosis not present

## 2019-06-23 ENCOUNTER — Telehealth: Payer: Self-pay | Admitting: Cardiology

## 2019-06-23 NOTE — Telephone Encounter (Signed)
Patient is returning phone call regarding results. 

## 2019-06-23 NOTE — Telephone Encounter (Signed)
Per Corine Shelter PA move up pt's appt Appt changed to 07/05/19 at 8:15 am to discuss echo findings ./cy

## 2019-07-05 ENCOUNTER — Telehealth: Payer: Self-pay

## 2019-07-05 ENCOUNTER — Telehealth (INDEPENDENT_AMBULATORY_CARE_PROVIDER_SITE_OTHER): Payer: Managed Care, Other (non HMO) | Admitting: Cardiology

## 2019-07-05 ENCOUNTER — Encounter: Payer: Self-pay | Admitting: Cardiology

## 2019-07-05 VITALS — Ht 72.0 in | Wt 160.0 lb

## 2019-07-05 DIAGNOSIS — I428 Other cardiomyopathies: Secondary | ICD-10-CM

## 2019-07-05 DIAGNOSIS — I10 Essential (primary) hypertension: Secondary | ICD-10-CM | POA: Diagnosis not present

## 2019-07-05 DIAGNOSIS — G4733 Obstructive sleep apnea (adult) (pediatric): Secondary | ICD-10-CM

## 2019-07-05 MED ORDER — LOSARTAN POTASSIUM 25 MG PO TABS: 25.0000 mg | ORAL_TABLET | Freq: Every day | ORAL | 3 refills | Status: DC

## 2019-07-05 NOTE — Patient Instructions (Signed)
Medication Instructions:  START Losartan 25mg  Take 1 tablet once a day  *If you need a refill on your cardiac medications before your next appointment, please call your pharmacy*  Lab Work: None  If you have labs (blood work) drawn today and your tests are completely normal, you will receive your results only by: MyChart Message (if you have MyChart) OR . A paper copy in the mail If you have any lab test that is abnormal or we need to change your treatment, we will call you to review the results.  Testing/Procedures: None   Follow-Up: At [facility], you and your health needs are our priority.  As part of our continuing mission to provide you with exceptional heart care, we have created designated Provider Care Teams.  These Care Teams include your primary Cardiologist (physician) and Advanced Practice Providers (APPs -  Physician Assistants and Nurse Practitioners) who all work together to provide you with the care you need, when you need it.  Your next appointment:   3 month(s)  The format for your next appointment:   In Person  Provider:   [facility], PA-C  Other Instructions

## 2019-07-05 NOTE — Telephone Encounter (Signed)
Contacted patient to discuss AVS Instructions. Gave patient Ronnie [name]'s recommendations from today's virtual office visit. Follow up appointment has been scheduled. Patient voiced understanding; AVS printed and mailed to patient.    

## 2019-07-05 NOTE — Telephone Encounter (Signed)

## 2019-07-05 NOTE — Progress Notes (Signed)
Virtual Visit via Telephone Note   This visit type was conducted due to national recommendations for restrictions regarding the COVID-19 Pandemic (e.g. social distancing) in an effort to limit this patient's exposure and mitigate transmission in our community.  Due to his co-morbid illnesses, this patient is at least at moderate risk for complications without adequate follow up.  This format is felt to be most appropriate for this patient at this time.  The patient did not have access to video technology/had technical difficulties with video requiring transitioning to audio format only (telephone).  All issues noted in this document were discussed and addressed.  No physical exam could be performed with this format.  Please refer to the patient's chart for his  consent to telehealth for [facility].   Date:  07/05/2019   ID:  Ronnie [name], DOB 11/20/1975, MRN [id number]  Patient Location: Home Provider Location: Office  PCP:  Patient, No Pcp Per  Cardiologist:  Quay Burow, MD  Electrophysiologist:  None   Evaluation Performed:  Follow-Up Visit  Chief Complaint:  none  History of Present Illness:    Ronnie [name] is a 44 y.o. male with a hx of nonischemic cardiomyopathy. He was admitted in the spring 2018 with pneumonia and was briefly intubated. His ejection fraction then was 35 to 40% with diffuse hypokinesis. Follow-up echo 02/02/2017 showed normal LV function, mild LVH, severe LAE.   He was seen in the ED late September 2018 with chest pain. He left before work-up could be completed. He did eventually have a coronary CT in March 2019 that showed normal coronaries with a calcium score of 0. He also had a sleep study in February 2019 that was positive for sleep apnea and CPAP was recommended.  The patient apparently lost his job after this and was not seen until Dec 2020.  He had been out of his medications for several months.  His B/P was poorly controlled but fortunately  he was asymptomatic.  I added Coreg and saw him back 06/09/2019.  He had been complaint with his medications and his B/P was improved.  I ordered an echo and labs and he was contacted today for follow up.   He continues to be compliant with his medications but he hasn't been following his B/P at home regularly.  His B/P when he went for his echo was 142/90.  I suggested we add Losartan 25 mg daily.  I would like to see him in follow up in 3 months.  I reinforced the importance of medication compliance and low sodium diet.  I'm not sure he is a candidate for Entresto (compliance and no symptoms of CHF).   The patient does not have symptoms concerning for COVID-19 infection (fever, chills, cough, or new shortness of breath).    Past Medical History:  Diagnosis Date  . Hypertension   . Substance abuse [facility])    Past Surgical History:  Procedure Laterality Date  . OTHER SURGICAL HISTORY     No surgical history      Current Meds  Medication Sig  . carvedilol (COREG) 12.5 MG tablet Take 1 tablet (12.5 mg total) by mouth 2 (two) times daily.     Allergies:   Lisinopril   Social History   Tobacco Use  . Smoking status: Current Every Day Smoker    Packs/day: 0.50    Years: 25.00    Pack years: 12.50    Types: Cigarettes  . Smokeless tobacco: Never Used  Substance Use  Topics  . Alcohol use: Yes    Alcohol/week: 6.0 standard drinks    Types: 6 Cans of beer per week    Comment: occ  . Drug use: Yes    Types: Cocaine     Family Hx: The patient's family history includes Heart disease in his maternal grandmother and mother.  ROS:   Please see the history of present illness.    All other systems reviewed and are negative.   Prior CV studies:   The following studies were reviewed today: Echo 06/22/2019  Labs/Other Tests and Data Reviewed:    EKG:  An ECG dated 05/26/2019 was personally reviewed today and demonstrated:  NSR, HR 94, LVH, QTc 475  Recent Labs: 06/09/2019: BUN 11;  Creatinine, Ser 1.25; Hemoglobin 14.0; Platelets 194; Potassium 4.4; Sodium 144   Recent Lipid Panel Lab Results  Component Value Date/Time   CHOL 178 12/26/2016 10:39 AM   TRIG 95.0 12/26/2016 10:39 AM   HDL 67.40 12/26/2016 10:39 AM   CHOLHDL 3 12/26/2016 10:39 AM   LDLCALC 92 12/26/2016 10:39 AM    Wt Readings from Last 3 Encounters:  07/05/19 160 lb (72.6 kg)  06/09/19 162 lb (73.5 kg)  05/26/19 162 lb 3.2 oz (73.6 kg)     Objective:    Vital Signs:  Ht 6' (1.829 m)   Wt 160 lb (72.6 kg)   BMI 21.70 kg/m    VITAL SIGNS:  reviewed  ASSESSMENT & PLAN:    Essential hypertension Add Losartan 25 mg daily  NICM (nonischemic cardiomyopathy) (HCC) H/O NICM, his last echo Sept 2018 showed normal LVF Echo jan 2021- EF 35-40% H/O normal coronaries by CT  [facility]- Positive study march 2019- never started C-pap. This should be re addressed next visit- cost of study and treatment is the issue  COVID-19 Education: The signs and symptoms of COVID-19 were discussed with the patient and how to seek care for testing (follow up with PCP or arrange E-visit).  The importance of social distancing was discussed today.  Time:   Today, I have spent 15 minutes with the patient with telehealth technology discussing the above problems.     Medication Adjustments/Labs and Tests Ordered: Current medicines are reviewed at length with the patient today.  Concerns regarding medicines are outlined above.   Tests Ordered: No orders of the defined types were placed in this encounter.   Medication Changes: No orders of the defined types were placed in this encounter.   Follow Up:  In Person 3 months  Signed, Corine Shelter, Cordelia Poche  07/05/2019 8:18 AM    [facility]

## 2019-07-14 ENCOUNTER — Telehealth: Payer: Managed Care, Other (non HMO) | Admitting: Cardiology

## 2019-09-29 ENCOUNTER — Ambulatory Visit: Payer: Managed Care, Other (non HMO) | Admitting: Cardiology

## 2019-12-21 ENCOUNTER — Encounter (HOSPITAL_COMMUNITY): Payer: Self-pay | Admitting: Emergency Medicine

## 2019-12-21 ENCOUNTER — Other Ambulatory Visit: Payer: Self-pay

## 2019-12-21 ENCOUNTER — Ambulatory Visit (INDEPENDENT_AMBULATORY_CARE_PROVIDER_SITE_OTHER): Payer: Self-pay

## 2019-12-21 ENCOUNTER — Ambulatory Visit (HOSPITAL_COMMUNITY)
Admission: EM | Admit: 2019-12-21 | Discharge: 2019-12-21 | Disposition: A | Discharge status: Home / Self Care | Payer: Self-pay | Attending: Urgent Care | Admitting: Urgent Care

## 2019-12-21 DIAGNOSIS — R519 Headache, unspecified: Secondary | ICD-10-CM

## 2019-12-21 DIAGNOSIS — M79644 Pain in right finger(s): Secondary | ICD-10-CM

## 2019-12-21 DIAGNOSIS — S60551A Superficial foreign body of right hand, initial encounter: Secondary | ICD-10-CM

## 2019-12-21 DIAGNOSIS — M795 Residual foreign body in soft tissue: Secondary | ICD-10-CM

## 2019-12-21 DIAGNOSIS — R0982 Postnasal drip: Secondary | ICD-10-CM

## 2019-12-21 DIAGNOSIS — R1310 Dysphagia, unspecified: Secondary | ICD-10-CM

## 2019-12-21 DIAGNOSIS — L02511 Cutaneous abscess of right hand: Secondary | ICD-10-CM

## 2019-12-21 DIAGNOSIS — R2231 Localized swelling, mass and lump, right upper limb: Secondary | ICD-10-CM

## 2019-12-21 DIAGNOSIS — K047 Periapical abscess without sinus: Secondary | ICD-10-CM

## 2019-12-21 MED ORDER — CETIRIZINE HCL 10 MG PO TABS: 10.0000 mg | ORAL_TABLET | Freq: Every day | ORAL | 0 refills | Status: DC

## 2019-12-21 MED ORDER — AMOXICILLIN-POT CLAVULANATE 875-125 MG PO TABS: 1.0000 | ORAL_TABLET | Freq: Two times a day (BID) | ORAL | 0 refills | Status: DC

## 2019-12-21 MED ORDER — AMLODIPINE BESYLATE 5 MG PO TABS: 5.0000 mg | ORAL_TABLET | Freq: Every day | ORAL | 0 refills | Status: DC

## 2019-12-21 NOTE — ED Triage Notes (Signed)
Left side of jaw with a knot or abscess.  Patient has drainage in throat, throat gets scratchy.  Scratchy throat for 2 months .    Abscess to right little finger.  Reports area is painful, it is discolored.  No known injury

## 2019-12-21 NOTE — Discharge Instructions (Addendum)
Please just use Tylenol at a dose of 500mg -650mg  once every 6 hours as needed for your toothaches, pains, fevers. Do not use any nonsteroidal anti-inflammatories (NSAIDs) like ibuprofen, Motrin, naproxen, Aleve, etc. which are all available over-the-counter.  Given your history of drug use, I cannot safely prescribe you opioid pain medications.  You will need to contact a hand specialist, either Dr. or Dr. , to help remove the foreign body in your hand.   [facility] [phone] extension [phone]  Dr. Amanda Pea [phone] [address]  [place] [phone] [address] [facility] [place].  [facility] [phone] extension [phone] [place], [address], [place] First come first serve for the first 10 clients.  May do simple extractions only, no wisdom teeth or surgery.  You may try the second for Thursday of the month starting at 6:30 AM.  [facility] You may call the school to see if they are still helping to provide dental care for emergent cases.   For diabetes or elevated blood sugar, please make sure you are avoiding starchy, carbohydrate foods like pasta, breads, pastry, rice, potatoes, desserts. These foods can elevated your blood sugar. Also, avoid sodas, sweet teas, sugary beverages, fruit juices.  Drinking plain water will be much more helpful, try 64 ounces of water daily.  It is okay to flavor your water naturally by cutting cucumber, lemon, mint or lime, placing it in a picture with water and drinking it over a period of 2 to 3 days as long as it remains refrigerated.    For elevated blood pressure, make sure you are monitoring salt in your diet.  Do not eat restaurant foods and limit processed foods at home, prepare/cook your own foods at home.  Processed foods include things like frozen meals preseasoned meats and dinners, deli meats, canned foods as they are high in sodium/salt.  Make sure your pain attention to sodium  labels on foods you by at the grocery store.  For seasoning you can use a brand called Mrs. Dash which includes a lot of salt free seasonings.  Salads - kale, spinach, cabbage, spring mix; use seeds like pumpkin seeds or sunflower seeds, almonds, walnuts or pecans; you can also use 1-2 hard boiled eggs in your salads Fruits - avocadoes, berries (blueberries, raspberries, blackberries), apples, oranges, pomegranate, pear; avoid eating bananas, grapes regularly Vegetables - aspargus, cauliflower, broccoli, green beans, brussel spouts, bell peppers; stay away from starchy vegetables like potatoes, carrots, peas  Regarding meat it is better to eat lean meats and limit your red meat including pork to once a week.  Wild caught fish, chicken breast are good options as they tend to be leaner sources of good protein.   DO NOT EAT ANY FOODS ON THIS LIST THAT YOU ARE ALLERGIC TO.
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
## 2020-02-28 ENCOUNTER — Other Ambulatory Visit: Payer: Self-pay | Admitting: *Deleted

## 2020-02-28 ENCOUNTER — Encounter: Payer: Self-pay | Admitting: *Deleted

## 2020-02-28 NOTE — Patient Outreach (Signed)
[facility]) Care Management  02/28/2020  Ronnie [name] [birth year] [id number]   Telephone Assessment Referral receive 02/27/2020 Initial Outreach 02/28/2020  RN attempted outreach however unsuccessful. RN able to leave a HIPAA approved voice message requesting a call back.   Will attempt another outreach over the next week for the needed resources.   Elliot Cousin, RN Care Management Coordinator [facility] Main Office [phone]

## 2020-03-05 ENCOUNTER — Other Ambulatory Visit: Payer: Self-pay | Admitting: *Deleted

## 2020-03-05 NOTE — Patient Outreach (Signed)
[facility]) Care Management  03/05/2020  Ronnie [name] Oct 22, 1975 [id number]  Telephone Assessment RN requested to contact this pt for resources at this time. Pt does not qualify for [facility] services for case management needs.   *No primary provider, no resources to assist with medications or Substance/Alcohol Abuse.  RN spoke with pt today and introduced [facility] service and the purpose for today's call. Pt very receptive to the information provided. Based upon the referral for needed resources the following was discussed in detail and RN case manager strongly encouraged pt to follow through with the available resources along with the risk involved if further management of care was not initiated on his behalf. Resources and contact numbers provider for the following:  Primary care provider-[facility], [employer] and [employer].  Medication assistance programs: [facility] and [email or web address]  Substance/Alcohol: [facility] and Alcohol/Drug/Substance Center  Pt again very appreciative and grateful for the information provided. No other needs presented at this time.     Elliot Cousin, RN Care Management Coordinator [facility] Main Office [phone]

## 2020-03-13 ENCOUNTER — Encounter (HOSPITAL_COMMUNITY): Payer: Self-pay | Admitting: Emergency Medicine

## 2020-03-13 ENCOUNTER — Emergency Department (HOSPITAL_COMMUNITY)
Admission: EM | Admit: 2020-03-13 | Discharge: 2020-03-14 | Disposition: A | Discharge status: Home / Self Care | Payer: Self-pay | Attending: Emergency Medicine | Admitting: Emergency Medicine

## 2020-03-13 ENCOUNTER — Other Ambulatory Visit: Payer: Self-pay

## 2020-03-13 DIAGNOSIS — Z79899 Other long term (current) drug therapy: Secondary | ICD-10-CM | POA: Insufficient documentation

## 2020-03-13 DIAGNOSIS — R531 Weakness: Secondary | ICD-10-CM | POA: Insufficient documentation

## 2020-03-13 DIAGNOSIS — M62838 Other muscle spasm: Secondary | ICD-10-CM

## 2020-03-13 DIAGNOSIS — I10 Essential (primary) hypertension: Secondary | ICD-10-CM | POA: Insufficient documentation

## 2020-03-13 DIAGNOSIS — F17210 Nicotine dependence, cigarettes, uncomplicated: Secondary | ICD-10-CM | POA: Insufficient documentation

## 2020-03-13 LAB — BASIC METABOLIC PANEL
Anion gap: 12 (ref 5–15)
BUN: 6 mg/dL (ref 6–20)
CO2: 23 mmol/L (ref 22–32)
Calcium: 9.8 mg/dL (ref 8.9–10.3)
Chloride: 104 mmol/L (ref 98–111)
Creatinine, Ser: 0.91 mg/dL (ref 0.61–1.24)
GFR, Estimated: >60 mL/min (ref >60)
Glucose, Bld: 92 mg/dL (ref 70–99)
Potassium: 3.8 mmol/L (ref 3.5–5.1)
Sodium: 139 mmol/L (ref 135–145)

## 2020-03-13 LAB — URINALYSIS, ROUTINE W REFLEX MICROSCOPIC
Bilirubin Urine: NEGATIVE
Glucose, UA: NEGATIVE mg/dL
Hgb urine dipstick: NEGATIVE
Ketones, ur: NEGATIVE mg/dL
Leukocytes,Ua: NEGATIVE
Nitrite: NEGATIVE
Protein, ur: NEGATIVE mg/dL
Specific Gravity, Urine: 1.001 — ABNORMAL LOW (ref 1.005–1.030)
pH: 6 (ref 5.0–8.0)

## 2020-03-13 LAB — CBC
HCT: 46.6 % (ref 39.0–52.0)
Hemoglobin: 14.7 g/dL (ref 13.0–17.0)
MCH: 31.5 pg (ref 26.0–34.0)
MCHC: 31.5 g/dL (ref 30.0–36.0)
MCV: 100 fL (ref 80.0–100.0)
Platelets: 217 10*3/uL (ref 150–400)
RBC: 4.66 MIL/uL (ref 4.22–5.81)
RDW: 13.4 % (ref 11.5–15.5)
WBC: 9.4 10*3/uL (ref 4.0–10.5)
nRBC: 0 % (ref 0.0–0.2)

## 2020-03-13 NOTE — ED Triage Notes (Signed)
Pt reports muscle spasms that started a month ago since receiving the 2nd COVID vaccine.  Pt states he can barely raise his arms but is able to do so in triage.  Pt states generalized weakness.

## 2020-03-14 NOTE — ED Provider Notes (Addendum)
[facility] EMERGENCY DEPARTMENT Provider Note   CSN: [id number] Arrival date & time: 03/13/20  2030     History Chief Complaint  Patient presents with  . Spasms    Ronnie [name] is a 44 y.o. male.  Patient presents to the emergency department with a chief complaint of extremity weakness.  He reports having had the symptoms for about 4 weeks.  He associates it with having gotten the second Covid vaccine.  He states that he first noticed it in his lower extremities and then in his upper extremities.  He denies having any difficulty breathing.  Denies any bowel or bladder incontinence.  He is able to ambulate.  He denies any fevers or chills.  Denies any pain.  He states that sometimes it feels like his muscles are spasming.  He has not seen anyone for this.  The history is provided by the patient. No language interpreter was used.       Past Medical History:  Diagnosis Date  . Hypertension   . Substance abuse [facility])     Patient Active Problem List   Diagnosis Date Noted  . LVH (left ventricular hypertrophy) 05/26/2019  . Routine adult health maintenance 12/26/2016  . Sleep apnea 12/26/2016  . NICM (nonischemic cardiomyopathy) (HCC) 10/03/2016  . Essential hypertension 10/03/2016  . Ventilator dependent (HCC)   . Encounter for orogastric (OG) tube placement   . Altered mental state 09/11/2016  . Acute kidney injury (HCC) 09/11/2016  . Acute respiratory failure (HCC) 09/11/2016  . Lactic acidosis 09/11/2016  . Smoker 09/11/2016  . ETOH abuse 09/11/2016  . Cocaine abuse (HCC) 09/11/2016    Past Surgical History:  Procedure Laterality Date  . OTHER SURGICAL HISTORY     No surgical history        Family History  Problem Relation Age of Onset  . Heart disease Mother   . Heart disease Maternal Grandmother     Social History   Tobacco Use  . Smoking status: Current Every Day Smoker    Packs/day: 0.50    Years: 25.00    Pack years: 12.50     Types: Cigarettes  . Smokeless tobacco: Never Used  Vaping Use  . Vaping Use: Never used  Substance Use Topics  . Alcohol use: Yes    Alcohol/week: 6.0 standard drinks    Types: 6 Cans of beer per week    Comment: occ  . Drug use: Not Currently    Types: Cocaine    Home Medications Prior to Admission medications   Medication Sig Start Date End Date Taking? Authorizing Provider  amLODipine (NORVASC) 5 MG tablet Take 1 tablet (5 mg total) by mouth daily. 12/21/19  Yes Wallis Bamberg, PA-C  amoxicillin-clavulanate (AUGMENTIN) 875-125 MG tablet Take 1 tablet by mouth every 12 (twelve) hours. Patient not taking: Reported on 03/14/2020 12/21/19   Wallis Bamberg, PA-C  carvedilol (COREG) 12.5 MG tablet Take 1 tablet (12.5 mg total) by mouth 2 (two) times daily. Patient taking differently: Take 12.5 mg by mouth 2 (two) times daily. none in 2 days 05/26/19 08/24/19  Abelino Derrick, PA-C  cetirizine (ZYRTEC ALLERGY) 10 MG tablet Take 1 tablet (10 mg total) by mouth daily. Patient not taking: Reported on 03/14/2020 12/21/19   Wallis Bamberg, PA-C  losartan (COZAAR) 25 MG tablet Take 1 tablet (25 mg total) by mouth daily. Patient taking differently: Take 25 mg by mouth daily. None in 2 days 07/05/19 10/03/19  Abelino Derrick, PA-C  Allergies    Lisinopril  Review of Systems   Review of Systems  All other systems reviewed and are negative.   Physical Exam Updated Vital Signs BP (!) 142/103   Pulse 85   Temp 98 F (36.7 C)   Resp 16   SpO2 97%   Physical Exam Vitals and nursing note reviewed.  Constitutional:      Appearance: He is well-developed.  HENT:     Head: Normocephalic and atraumatic.  Eyes:     Conjunctiva/sclera: Conjunctivae normal.  Cardiovascular:     Rate and Rhythm: Normal rate and regular rhythm.     Heart sounds: No murmur heard.   Pulmonary:     Effort: Pulmonary effort is normal. No respiratory distress.     Breath sounds: Normal breath sounds.  Abdominal:      Palpations: Abdomen is soft.     Tenderness: There is no abdominal tenderness.  Musculoskeletal:     Cervical back: Neck supple.  Skin:    General: Skin is warm and dry.  Neurological:     Mental Status: He is alert.     Deep Tendon Reflexes:     Reflex Scores:      Tricep reflexes are 2+ on the right side and 2+ on the left side.      Bicep reflexes are 2+ on the right side and 2+ on the left side.      Brachioradialis reflexes are 2+ on the right side and 2+ on the left side.      Patellar reflexes are 2+ on the right side and 2+ on the left side.    Comments: 4/5 strength in bilateral upper and lower extremities ?effort     ED Results / Procedures / Treatments   Labs (all labs ordered are listed, but only abnormal results are displayed) Labs Reviewed  URINALYSIS, ROUTINE W REFLEX MICROSCOPIC - Abnormal; Notable for the following components:      Result Value   Color, Urine COLORLESS (*)    Specific Gravity, Urine 1.001 (*)    All other components within normal limits  BASIC METABOLIC PANEL  CBC    EKG None  Radiology No results found.  Procedures Procedures (including critical care time)  Medications Ordered in ED Medications - No data to display  ED Course  I have reviewed the triage vital signs and the nursing notes.  Pertinent labs & imaging results that were available during my care of the patient were reviewed by me and considered in my medical decision making (see chart for details).    MDM Rules/Calculators/A&P                          Patient with reported weakness in his extremities for the past 4 weeks.  He does seem slightly weak on my exam, but difficult to tell if it is effort dependent.  He is able to ambulate without any difficulty.  He moves all of his extremities without difficulty.  He has normal reflexes.  He doesn't have pain.  Denies any injuries.  Case discussed with Dr. Nicanor Alcon.  I discussed the case with Dr. Iver Nestle, from neurology,  who states that GBS is unlikely given reflexes are present.  She recommends follow-up on an outpatient basis with neurology.  I have placed order for ambulatory referral to neurology.  Also placed order for [facility] consult due to risk for patient being lost to follow-up. Final Clinical Impression(s) / ED  Diagnoses Final diagnoses:  Muscle spasm  Weakness    Rx / DC Orders ED Discharge Orders         Ordered    Ambulatory referral to Neurology       Comments: An appointment is requested in approximately: 1 week   03/14/20 0228           Roxy Horseman, PA-C 03/14/20 0234    Roxy Horseman, PA-C 03/14/20 0235    Palumbo, April, MD 03/14/20 2440

## 2020-03-15 ENCOUNTER — Ambulatory Visit: Payer: Self-pay | Admitting: Neurology

## 2020-03-29 ENCOUNTER — Other Ambulatory Visit: Payer: Self-pay | Admitting: *Deleted

## 2020-03-29 NOTE — Patient Outreach (Signed)
[facility]) Care Management  03/29/2020  Ronnie [name] 10-Feb-1976 [id number]  Note pt is not eligible for [facility] services however [facility] was assisting pt with available resources.   RN received a call from this pt inquiring on any dental care resources. His voice message indicated he remains without insurance at this time.  Returned the call to pt and provided the available resources however pt is aware services maybe not be continued with these resources provided [facility] dental dept and [facility] for x-rays). Also informed pt to be on the look with Francena Hanly offers free dental services annually however this event has taken place since the pandemic.   Pt very appreciative and excited to inform RN case manager that he has obtained a primary provided appointment pending this month at the [facility] based upon the information provided to this pt last month from this RN case manager. RN praised pt for his efforts is seeking personalized care.   Again pt very grateful for the assistance and resources provided.  Elliot Cousin, RN Care Management Coordinator [facility] Main Office [phone]

## 2020-04-24 ENCOUNTER — Other Ambulatory Visit: Payer: Self-pay | Admitting: Nurse Practitioner

## 2020-04-24 ENCOUNTER — Ambulatory Visit: Payer: Self-pay | Attending: Nurse Practitioner | Admitting: Nurse Practitioner

## 2020-04-24 ENCOUNTER — Encounter: Payer: Self-pay | Admitting: Nurse Practitioner

## 2020-04-24 ENCOUNTER — Other Ambulatory Visit: Payer: Self-pay

## 2020-04-24 VITALS — BP 132/89 | HR 100 | Temp 98.0°F | Ht 72.0 in | Wt 143.0 lb

## 2020-04-24 DIAGNOSIS — M791 Myalgia, unspecified site: Secondary | ICD-10-CM

## 2020-04-24 DIAGNOSIS — I10 Essential (primary) hypertension: Secondary | ICD-10-CM

## 2020-04-24 DIAGNOSIS — Z1211 Encounter for screening for malignant neoplasm of colon: Secondary | ICD-10-CM

## 2020-04-24 MED ORDER — CARVEDILOL 12.5 MG PO TABS: 12.5000 mg | ORAL_TABLET | Freq: Two times a day (BID) | ORAL | 3 refills | Status: DC

## 2020-04-24 MED ORDER — AMLODIPINE BESYLATE 5 MG PO TABS: 5.0000 mg | ORAL_TABLET | Freq: Every day | ORAL | 0 refills | Status: DC

## 2020-04-24 MED ORDER — LOSARTAN POTASSIUM 25 MG PO TABS: 12.5000 mg | ORAL_TABLET | Freq: Every day | ORAL | 1 refills | Status: DC

## 2020-04-24 MED ORDER — CYCLOBENZAPRINE HCL 10 MG PO TABS: 10.0000 mg | ORAL_TABLET | Freq: Three times a day (TID) | ORAL | 1 refills | Status: DC | PRN
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
## 2020-04-25 ENCOUNTER — Telehealth: Payer: Self-pay | Admitting: Nurse Practitioner

## 2020-04-25 MED FILL — LOSARTAN POTASSIUM 25 MG TA: 25 | 30 days supply | Qty: 15 | Fill #0

## 2020-04-25 MED FILL — AMLODIPINE BESYLATE 5 MG TA: 5 | 30 days supply | Qty: 30 | Fill #0

## 2020-04-25 MED FILL — CARVEDILOL 12.5 MG TABLET: 12.5 | 30 days supply | Qty: 60 | Fill #0

## 2020-04-25 MED FILL — CYCLOBENZAPRINE 10 MG TAB: 10 | 20 days supply | Qty: 60 | Fill #0

## 2020-04-25 NOTE — Telephone Encounter (Signed)
Copied from CRM [id number]. Topic: General - Other >> Apr 25, 2020 12:24 PM Herby Abraham C wrote: Reason for CRM: pt called in for assistance. Pt says that he was seen by provider and they discussed placing a neurology referral. Pt says that he would like to go ahead with the referral.   Please assist.

## 2020-04-27 NOTE — Telephone Encounter (Signed)
Has he been approved for CAFA or does he want to pay out of pocket

## 2020-04-27 NOTE — Telephone Encounter (Signed)
Spoke to patient. Patient stated he is going to pay out of pocket. CMA stated He can go to any neurologist. He would need to call their office and ask for the price range for the visit.  Pt. Understood.

## 2020-05-17 ENCOUNTER — Telehealth: Payer: Self-pay | Admitting: *Deleted

## 2020-05-17 ENCOUNTER — Other Ambulatory Visit: Payer: Self-pay

## 2020-05-17 ENCOUNTER — Ambulatory Visit: Payer: Self-pay | Attending: Nurse Practitioner | Admitting: General Surgery

## 2020-05-17 ENCOUNTER — Encounter: Payer: Self-pay | Admitting: General Surgery

## 2020-05-17 VITALS — BP 136/102 | HR 98 | Temp 98.3°F | Resp 16 | Wt 144.8 lb

## 2020-05-17 DIAGNOSIS — R29898 Other symptoms and signs involving the musculoskeletal system: Secondary | ICD-10-CM

## 2020-05-17 DIAGNOSIS — S60459S Superficial foreign body of unspecified finger, sequela: Secondary | ICD-10-CM

## 2020-05-17 DIAGNOSIS — L089 Local infection of the skin and subcutaneous tissue, unspecified: Secondary | ICD-10-CM

## 2020-05-17 NOTE — Patient Instructions (Signed)
Keep dressing on for 24 hours.  May remove and cleanse wound with soap and water.  Cover as needed.  Will attempt to get follow up for neurologic problem.
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
## 2020-05-21 ENCOUNTER — Encounter: Payer: Self-pay | Admitting: Neurology

## 2020-06-15 ENCOUNTER — Ambulatory Visit: Payer: No Typology Code available for payment source | Attending: Neuropathology | Admitting: Surgery

## 2020-06-15 DIAGNOSIS — G5681 Other specified mononeuropathies of right upper limb: Secondary | ICD-10-CM | POA: Insufficient documentation

## 2020-06-15 DIAGNOSIS — M6289 Other specified disorders of muscle: Secondary | ICD-10-CM | POA: Insufficient documentation

## 2020-06-22 DIAGNOSIS — F17210 Nicotine dependence, cigarettes, uncomplicated: Secondary | ICD-10-CM | POA: Diagnosis present

## 2020-06-22 DIAGNOSIS — Z8249 Family history of ischemic heart disease and other diseases of the circulatory system: Secondary | ICD-10-CM | POA: Insufficient documentation

## 2020-06-26 NOTE — Telephone Encounter (Signed)
 Ambulatory Care Navigator Community [facility] Hub Progress Note  06/26/2020  1:08 PM      Patient Situation Updates:    SW sent patient a link to the [facility] request to receive updates on rental assistance.     SW called 211 to ask about the timing of applications. Representative said that she recommends calling back to check on status and that that website might not be up-to-date.     Interventions Completed:    Sent resource related link    Plan / Follow Up Needs:    Search [employer] [place] for additional rental assistance resources     Littie Deeds, [facility]  Ambulatory Care Navigator, Social Worker  Phone: [phone]

## 2020-06-26 NOTE — Telephone Encounter (Signed)
 Ambulatory Care Navigator [employer] Hub Outreach  06/25/2020 (note entered 2/1)  2:45 PM    The patient has consented to [employer] screening and program: Yes.         Reason for referral:    Care Coordination     Disease Education     Elder and Disabled Adult Support Services     Goals of Care     Assessment of Needs        Referring provider: Marlaine Hind, MD    Patient outreach:   SW called called and spoke to Patient. Introduced herself and explained role Secretary/administrator. Patient confirmed his identity and asked that SW speak with his mother. Patient's mother got on the phone and SW introduced herself again and explained the community resource hub. She asked about patient's needs. Patient's mother put the phone on speaker so they could both talk/hear. They agreed to be part of the program and consented to information shared as needed for Referrals.    Patient's mother said that she is disabled, and she can't help the patient with all his needs. SW asked if Patient could use a caregiver and patient's mother agreed. SW said she could complete a home and community service referral to if patient would be eligible for caregiver hours. Patient and patient mother agreed to referral.  Patient's mother mentioned that he could use disability. SW said that she could put a referral into the patient financial navigation team and they could help with looking at disability application.    Patient's mom thought that Patient might need home health both PT and OT. SW did find a home health referral in the chart.     They stated they are OK with food and do not have any needs in this area at this time.    Patient's mother stated that they have financial needs such as rent assistance and utility assistance. SW asked if she could look on the platform [employer] [place] to find organizations that may be able to help and share patient's info as needed. Patient and mother consented.     SW asked about the  stress level of the patient and if he would be interested in counseling. Patient said he would consider it and let SW know, but not needed at this time. SW mentioned the warm line where volunteers are available every day from 12:30 to 9:00PM to talk if anyone is feeling stressed or like they just want to talk to someone. SW confirmed patient's email and stated that she would send that information to him in case he would like to use it.    SW mentioned that the referral request a nurse call the patient; will be calling as well.         Social Determinants of Health Needs:    Does patient have financial issues: Yes: could use rent, utility assistance; would like to apply for disability    Housing needs: No     Transportation needs:  Yes, may need transportation     Limited access to food: No:      INTERVENTIONS:   Financial aid/PFN coordination         PLAN:   Complete HCS referral   Email WARM line information  Look at [employer] [place] for financial assistance  Monitor 211 rental assistance and update patient  Ask about interest in Health Home program during next conversation  Route note to nurse [employer]  Littie Deeds, [facility]  Ambulatory Care Navigator  Phone:  [phone]      *Route note to referring provider

## 2020-07-23 ENCOUNTER — Ambulatory Visit: Payer: Self-pay | Admitting: Nurse Practitioner

## 2020-08-10 ENCOUNTER — Ambulatory Visit: Payer: Self-pay | Admitting: Neurology

## 2020-09-27 NOTE — Telephone Encounter (Addendum)
 Ambulatory Care Navigator Community Health Hub Progress Note  09/27/2020  9:51 AM      Patient Situation Updates:    SW booked a ride through [employer] for patient for his 4pm neurology appt on 5/6.     FROM  [address] [place], [place] [address], [place]  TO  [address] [place], [place] [address], [place]    Pick up from home to :  Schedule Pickup05-10-2020 3:08 PM PDT  Schedule Appt.09-28-2020 3:45 PM PDT    Will call ride home     Unable to Pay (UTP)  [employer] Eligibility Form  [facility]    Patient Name: Paul Lloyd.  MRN:   [id number]  CSN:   [id number]  Age/Sex:  45 year old/male    Date of Service:  09/28/20      [facility] Facility: (P) Ambulatory PHP     Location of service: (P) Outpatient Clinic     Patient Eligible?: (P) Insufficient financial resources (Required)     Eligibility Met: (P) Yes     [employer]: (P) transportation    Signature:  Littie Deeds, [facility]    Patient said that [facility] neurology did not receive the referral. Doctor will send another. Melvina said it could take up to 3 months for an appointment. Patient will need a ride to appointment - across [employer].     Interventions Completed:    Ride booked  Confirmed with patient    Plan / Follow Up Needs:    Be available for will call ride home    Littie Deeds, [facility]  Ambulatory Care Navigator, Social Worker  Phone: [phone]

## 2020-09-28 NOTE — Telephone Encounter (Signed)
 Ambulatory Care Navigator Community Health Hub Progress Note  09/28/2020  8:19 PM      Patient Situation Updates:    SW received message from patient that appt was completed; she ordered Lyft from [employer]. Patient said that there's not much the neurology doctor can do for him. Previously said that he'll need to go to [facility].    Interventions Completed:    Ordered ride for patient    Plan / Follow Up Needs:    Be available for patient as needed    Littie Deeds, [facility]  Ambulatory Care Navigator, Social Worker  Phone: [phone]

## 2020-10-10 ENCOUNTER — Encounter (HOSPITAL_BASED_OUTPATIENT_CLINIC_OR_DEPARTMENT_OTHER): Payer: Self-pay

## 2020-10-10 ENCOUNTER — Telehealth (HOSPITAL_BASED_OUTPATIENT_CLINIC_OR_DEPARTMENT_OTHER): Payer: Self-pay

## 2020-10-10 NOTE — Telephone Encounter (Signed)
RETURN CALL: Voicemail - Detailed Message      SUBJECT:  General Message     MESSAGE: Pt called to verify if referral  was received. CCR informed that referral was received per clinic. Caller (pt's fiance) got on the phone and state pt is getting weaker every single day,losing muscle and weight, pt needs help taking a bath and chokes when sleeping at night. Caller wants to know if the referral can be process quicker?Referral should have been urgent and wants pt to be seen asap.   Please call back.Thank you.

## 2020-10-10 NOTE — Telephone Encounter (Signed)
Error

## 2020-10-24 ENCOUNTER — Emergency Department: Payer: Self-pay
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
## 2020-12-04 NOTE — Telephone Encounter (Signed)
 Ambulatory Care Navigator [facility] Progress Note  12/04/2020  8:54 AM      Patient Situation Updates:    Patient asked for insurance card info; he lost his wallet. SW provided a copy of patient's insurance card via email.     SW also booked Lyft rides for appointments that were previously discussed.    TRIP ON July 13TH   PICK UP 7:35 AM - $37.64  [address] [place], [place] [address], [place]    [address] [place], [place] [address], [place]    TRIP ON July 19TH   PICK UP 9:36 AM - $21.98  [address] [place], [place] [address], [place]    [address], [place], [place] [address], [place]    Unable to Pay (UTP)  [employer] Eligibility Form  [facility]    Patient Name: Paul Lloyd.  MRN:   [id number]  CSN:   [id number]  Age/Sex:  45 year old/male    Date of Service:  7/13 and 7/19         Signature:  Littie Deeds, [place]      Interventions Completed:    Insurance card info provided  Lyft rides to two appts booked    Plan / Follow Up Needs:    Be available for will call rides    Littie Deeds, [facility]  Ambulatory Care Navigator, Social Worker  Phone: [phone]

## 2020-12-05 NOTE — Telephone Encounter (Signed)
 Amb[facility] Progress Note  12/05/2020  9:19 AM      Patient Situation Updates:    SW received a message from patient that his appointment was complete. SW ordered Lyft through [place]. SW confirmed via [employer] that patient arrived home.     Interventions Completed:    Arranged transportation    Plan / Follow Up Needs:    Be available for transportation next week    Littie Deeds, [facility]  Ambulatory Care Navigator, Social Worker  Phone: [phone]

## 2020-12-13 NOTE — Telephone Encounter (Addendum)
 Amb[facility] Progress Note  12/13/2020  2:06 PM      Patient Situation Updates:    SW received a message from patient that he needs a ride to an appt tomorrow with PCP. SW booked a ride for patient via [employer]:      TRIP  [address]   [place], [place] [address]  (ride ID - [id number]  Scheduled Pickup 12-14-2020 1:07 PM PDT  Schedule Appt. (arrival at doctor's office) 12-14-2020 1:45 PM PDT                                                         [address],   [place], [place] [address]  (ride ID [phone])      Will call return    SW sent an email with the trip details to patient.     SW called patient to inform him of the trip details and email sent. SW also asked if patient was interested in a referral to the [facility] program; patient is interested.     SW sent an email to [employer] [facility] [place] ([email or web address]) to ask if patient is eligible for Health Home program.     Interventions Completed:    SW received transportation request from patient  SW booked ride for patient  SW emailed patient ride details  SW called patient to provide ride details and ask if patient is interested in Health Home referral  Sent email to [facility] regarding Health Home eligibility    Plan / Follow Up Needs:    Be available for patient's will call ride home  Wait to hear from [facility] regarding Health Home eligibility    Littie Deeds, [facility]  Ambulatory Care Navigator, Social Worker  Phone: [phone]    ADDENDUM: Needed to add the UTP info:  Unable to Pay (UTP)  [employer] Eligibility Form  [facility]    Patient Name: Paul Lloyd.  MRN:   [id number]  CSN:   [id number]  Age/Sex:  45 year old/male    Date of Service:  12/14/20 ($44.75)      Justification if greater than 14 days required:  NA    Signature:  Littie Deeds, [facility]    ADDENDUM:   SW received an email from [place] that patient does qualify for Health Home program  SW  called patient to tell them about [facility] program and patient said that appt tomorrow was rescheduled to 8/1 @10 :30am; SW said that she would change the reservation  SW changed reservation in [place] and sent a message to patient.     Littie Deeds, [facility]  Ambulatory Care Navigator, Social Worker  Phone: [phone]

## 2020-12-23 NOTE — Telephone Encounter (Signed)
Amb[facility] Care [facility] Progress Note  12/17/2020  7:59 PM      Patient Situation Updates:    SW sent patient an email on July 25th at 1958 regarding the Health Home program per his request.     Interventions Completed:    Email sent to patient    Plan / Follow Up Needs:    SW to check with patient regarding any additional needs and sign off is no additional needs    Littie Deeds, [facility]  Ambulatory Care Navigator, Social Worker  Phone: [phone]    EMAIL TEXT:    From: Lucien Mons   Sent: Monday, December 17, 2020 7:58 PM  To: [email or web address]  Subject: Health Home program info    Hi Donnie,     Here's some information about the Health Home program. Someone should be reaching out to you soon, you may get a letter in the mail first. I'm not sure which organization they'll be from because there are several that do that work.     Health Home Booklet (http://clayton-rivera.info/)    Home Health Program Brochure (http://clayton-rivera.info/)        Tool Kit for Care Coordinators for clients (http://clayton-rivera.info/)    Health Homes Fact Sheet (http://clayton-rivera.info/)        Lucien Mons, MBA, [place], [facility]  Social Psychologist, prison and probation services   Phone: [phone]  Email: [email or web address] (link: [email or web address])   Mailstop: [id number]  Fax: [phone]

## 2020-12-24 NOTE — Telephone Encounter (Signed)
 Amb[facility] Care Nav[facility] Progress Note  12/24/2020  8:26 AM      Patient Situation Updates:    SW received message from patient that his appointment today was cancelled. SW replied that she would cancel the [employer]/Lyft reservation.     Interventions Completed:    Cancelled rides for today    Plan / Follow Up Needs:    Be available to for rescheduled appt if needed    Littie Deeds, [facility]  Ambulatory Care Navigator, Social Worker  Phone: [phone]

## 2021-01-11 ENCOUNTER — Ambulatory Visit: Payer: No Typology Code available for payment source | Attending: Neurology | Admitting: Neurology

## 2021-01-11 ENCOUNTER — Other Ambulatory Visit (HOSPITAL_BASED_OUTPATIENT_CLINIC_OR_DEPARTMENT_OTHER): Payer: Self-pay | Admitting: Neurology

## 2021-01-11 VITALS — BP 145/100 | HR 81 | Temp 98.8°F | Resp 20 | Ht 72.0 in | Wt 137.0 lb

## 2021-01-11 DIAGNOSIS — R1310 Dysphagia, unspecified: Secondary | ICD-10-CM | POA: Insufficient documentation

## 2021-01-11 DIAGNOSIS — M6281 Muscle weakness (generalized): Secondary | ICD-10-CM | POA: Insufficient documentation

## 2021-01-11 DIAGNOSIS — R0600 Dyspnea, unspecified: Secondary | ICD-10-CM | POA: Insufficient documentation

## 2021-01-11 DIAGNOSIS — R0609 Other forms of dyspnea: Secondary | ICD-10-CM

## 2021-01-11 DIAGNOSIS — G729 Myopathy, unspecified: Secondary | ICD-10-CM

## 2021-01-11 DIAGNOSIS — M791 Myalgia, unspecified site: Secondary | ICD-10-CM | POA: Insufficient documentation

## 2021-01-11 MED ORDER — GABAPENTIN 300 MG OR CAPS
300.0000 mg | ORAL_CAPSULE | Freq: Three times a day (TID) | ORAL | 5 refills | Status: DC
Start: 2021-01-11 — End: 2021-01-11

## 2021-01-11 MED ORDER — GABAPENTIN 300 MG OR CAPS
300.0000 mg | ORAL_CAPSULE | Freq: Three times a day (TID) | ORAL | 5 refills | Status: DC
Start: 2021-01-11 — End: 2021-05-31

## 2021-01-11 NOTE — Patient Instructions (Signed)
It was a pleasure to meet you and Paul Lloyd today.   You met Dr. Vicente Serene and Dr. Mariel Kansky.   Here is what we discussed:   Decrease the prednisone - start taking 74m every other day for 2 weeks, and then stop the prednisone  We recommend trying gabapentin for pain: You will receive 3063mcapsules.   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   We will try to arrange for the breathing test and swallow evaluation to be on the same day; I will see if it is possible to also have the follow up around the same time, or soon after.     Please contact me by MyChart if you have any questions.

## 2021-01-11 NOTE — Progress Notes (Signed)
Paul Lloyd  INITIAL VISIT    Chief Complaint: muscle weakness       HPI:  Paul Lloyd. 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  He is accompanied by Paul Lloyd, his partner/fiancee.   History is per patient, partner and chart review.    Paul Lloyd reports onset of symptoms in 01/2020. He notes that he had a dental abscess and received the 2nd COVID vaccine, then a few weeks later he woke up and had severe muscle aching in all limbs. He began to notice worsening proximal arm and leg weakness and muscle pain. He had   Admitted for weakness and dysphagia in 05/2020. Per medical record, the weakness started in the proximal muscles of his legs and in 02/2020 began to involve his proximal upper extremities. He was found to have elevated CK (300s-400s) and was seen by neurology- he had MRI of the thighs (R and part of L) which showed bilateral heterogenous muscle edema and enhancement in the thighs. He had muscle biopsy which showed necrotizing/regenerating and neurogenic features. During that admission he also had imaging of neuraxis, his MRI brain showed periventricular hyperintensities and the ddx of MS was raised, LP negative for oligoclonal bands. After muscle biopsy was completed, he was treated with IV solumedrol x3 days then continued on steroids. He then saw neurologist Paul Lloyd 06/2020 for EMG which showed evidence of myopathy. He was continued on high dose steroids (prednisone 33m daily) but patient reports that this was ineffective other than side effect of mood swings. He has been slowly tapering down and is now on prednisone 182mper day for the past 1.5 months. He was started on a course of IVIG x5 days in 07/2020 which did not help his symptoms. The plan was 0.4 g/kg daily for 5 days, then 1 g/kg every month, but only the initial dose was done. He had extensive lab testing and LP, none of which  has been revealing of an etiology. He saw PT initially but stopped 07/2020 as he could not tolerate muscle pain during sessions. He was unable to see OT or ST due to insurance issues (none covered by his insurance in his immediate area).     Today he reports continued muscle weakness. He initially had difficulty walking primarily due to aching pain in his thighs, and has progressively has had more diffculty with getting out of a chair, using stairs (more difficulty going down the stairs due to legs giving out), and getting up from the toilet. He and his partner report multiple falls 5-6 times since symptoms started.  He reports that in his arms, he initially noticed difficulty showering due to difficulty lifting his arms and eventually couldn't raise his arms above the shoulder. He started to take baths instead, but then noticed he had difficulty lifting himself out of the tub. He now has hand rails and a shower chair and his fiancee helps with getting in/out of bath. They note that he has had muscle wasting, particularly in the upper arms, thighs, and buttocks bilaterally. He also endorses muscle twitching which he has noticed primarily in the left upper arm. He endorses dyspnea on exertion which is progressing- he reports significantly decreased exercise tolerance from before 01/2020, and his partner reports that after about 1/2 block of walking or less, he has to catch his breath. He also endorses orthopnea which is more chronic, for several years, but he  also endorses this has been worse since his weakness started. He endorses dysphagia with liquids (particularly milk and cereal) and coughing; this is less frequent with solids but he does endorse he has to cough up food sometimes. He had SLP evaluation in the hospital in 05/2020 which suggested esophageal dysfunction. He subsequently had barium swallow as well which showed flash laryngeal penetration but no aspiration. He reports voice change of hoarseness,  sometimes loses his voice, and partner says it has been a little harder to understand him, but they do not note a clear slurring of speech. Endorses some appetite loss- worse when his pain flares up. He endorses significant weight loss, possibly about 20lb - he was around 160lb prior to symptoms starting and today he is 137lb. He endorses fatigue but about the same as before- partner thinks he is more fatigued than before in that he is not able to do as much as before, and epecially limited when he has a pain "flare"- pain in all the muscles (arms and thighs). Endorses occasional diplopia in the mornings, not worse as the day goes on, also occasional R ptosis in the mornings He endorses paresthesias in anterior thighs but otherwise no numbness. His pain managed by PCP. He takes oxycodone as needed and over the counter medications as needed (tylenol, ibuprofen). He does not think he has ever been on gabapentin, Lyrica, Cymbalta. We could not find any medication records indicating trying these medications before.    In regards to his dilated cardiomyopathy, he was diagnosed in 2018 when he was admitted for pneumonia, and during work-up they discovered that he had reduced ejection fraction. He reports this seems to have improved, as his ECHO in 06/22/2019 showed EF 35-40%, but a TTE 10/25/2020 showed EF 51%.     FH: no known FH with muscle disease, no family with ambulation difficulties; maternal uncle 67 yo died from heart disease, grandma 83yo ded from hear tdisease, maternal aunts x2 passed from heart disease (51s), mom HTN but otherwise healthy. Son passed away at 2yo from heart issue, son Loistine Simas had no health issues, just some now from [place]: he was an active child, no issues with sports, no tripping or clumsiness, no breaking bones but did sprain ankles a few times.       ADLs: needs help with showering/bathing, cooking; independent in bathing/toileting/feeding himself. Uses cane.  Work/employment-  unemployed since 01/2020 due to muscle pains and weakness  Toxic habits- ETOH occasionally about 2 beers per week, but used to drink more heavily in the past; endorses smoking 1pack every other day, trying to quit, denies current drug use, endorses past hx polysubstance use but in remission.    Past work-up:  Labs   Myositis panel negative  HMGCR neg  CSF neg  ACHR neg  MUSK neg  T. Pallidum neg  A1c 5.9  LFTs wnl  CK 444 (06/09/2020), 357 (06/12/2020)  Hep B/C panels neg  HIV neg  TSH wnl   CSF testing (WBC 1, RBC 8, protein 33, glucose 75, gram neg, oligoclonal bands neg)  ANA neg  CRP 0.6  ESR 20 --> 15  SSA/SSB neg  UA wnl, no myoglobinuria noted      EDX: 06/28/2020 (Paul Lloyd)  EMG FINDINGS: Nerve conduction study demonstrated normal motor response in right median, ulnar, peroneal, and tibial nerves. Sensory nerve conduction study demonstrated normal sensory response in the right radial, peroneal, and sural nerves. EMG concentric needle examination demonstrates myopathic changes  in proximal muscles. No acute denervation is seen in all the muscles examined.    INTERPRETATION: Abnormal study. 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 There is no evidence to suggests peripheral polyneuropathy in either upper or lower extremities. There is no evidence to suggests motor neuron disease.         MRI R femur 06/14/2020  IMPRESSION:   1. Heterogeneous muscle edema and enhancement in the right adductor muscle   group, quadriceps muscles, tensor fascia lata, and hamstring muscles.     2. 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 Please correlate clinically.        Muscle Biopsy (full report under Pathology) 06/15/2020  NEUROMUSCULAR PROCESS WITH NECROTIZING/REGENERATING AND NEUROGENIC FEATURES  This right quadriceps  muscle biopsy is involved by a process characterized by variably prominent and clustered myonecrosis and myoregeneration as well as features of neurogenic atrophy (small angular esterase rich fibers and nuclear bags). Features such as hypertrophy/hypotrophy, focally increased centralized myonuclei, focal endomysial fibrosis and split/whorled fibers are suggestive of chronicity. There is no significant endomysial/perimysial lymphocytic inflammation, T-lymphocytic infiltration of non-necrotic myofibers or diffuse and robust HLA-I upregulation. Despite necrosis/regeneration macrophage infiltration of the endomysium is modest. There is no increased alkaline phosphatase staining, perifascicular necrosis, perifascicular atrophy, rimmed vacuoles or pTDP43/TDP43 immunoreactive aggregates. The clinical concern of myositis is noted. Absence of some of the aforementioned features argue against a firm diagnosis of entities such as polymyositis/overlap myositis, sporadic inclusion body myositis (sIBM), immune myopathies with prominent perimysial pathology (some of which may arise in the setting of  antisynthetase syndromes) and some dermatomyopathies. Features of active vasculitis or granulomatous disease are not noted. Acid phosphatase does not show diagnostic evidence of a myopathy with autophagic vacuoles (including acid maltase deficiency; confirmed on PAS). A limited panel of immunohistochemical stains to examine for the possibility of a dystrophinopathy or some of the more common forms of limb-girdle muscular dystrophy (LGMD) or myofibrillar myopathies did  not show diagnostic alterations.      Swallow Eval 06/15/2020   No oropharyngeal dysphagia concern noted. Suspect esophageal dysfunction due to patient reported discomfort, globus sensation with liquid intake, and report of reflux of liquids earlier today.      XR Esophogram/Barium Swallow 10/24/2020  IMPRESSION: Flash laryngeal penetration without aspiration is observed.     Otherwise unremarkable double contrast esophagram as described above.       10/25/2020 TTE  The left ventricular chamber size is normal. The left ventricular systolic function is borderline reduced.  The calculated ejection fraction is 51% by 3D. Global longitudinal strain (GLS) is -18.4%. There is no  evidence of left ventricular hypertrophy. Spectral Doppler is indicative of a impaired relaxation (stage 1)  filling pattern.  The left atrium is mildly dilated.       Medical Hx: No past medical history on file.    Surgical Hx: No past surgical history on file.    Social Hx:   Social History     Socioeconomic History    Marital status: Not on file     Spouse name: Not on file    Number of children: Not on file    Years of education: Not on file    Highest education level: Not on file   Occupational History    Not on file   Tobacco Use  Smoking status: Not on file    Smokeless tobacco: Not on file   Substance and Sexual Activity    Alcohol use: Not on file    Drug use: Not on file    Sexual activity: Not on file   Other Topics Concern    Not on file   Social History Narrative    Not on file     Social Determinants of Health     Financial Resource Strain: Not on file   Food Insecurity: Not on file   Transportation Needs: Not on file   Physical Activity: Not on file   Stress: Not on file   Social Connections: Not on file   Intimate Partner Violence: Not on file   Housing Stability: Not on file     Lives with Sheral Flow    Allergies:   Review of patient's allergies indicates:  Allergies   Allergen Reactions    Lisinopril Resp: Cough       Current medication:   Current Outpatient Medications   Medication Sig Dispense Refill    acetaminophen 325 MG tablet Take 2 tablets by mouth every 6 hours as needed.      amLODIPine 5 MG tablet Take by mouth.      carVEDilol 12.5 MG tablet Take by mouth.      ibuprofen 600 MG tablet Take 600 mg by mouth.      losartan 25 MG tablet Take by mouth.      oxyCODONE  10 MG tablet Take 1 tablet by mouth every 6 hours.      predniSONE 10 MG tablet TAKE 3 TABLETS BY MOUTH EVERY MORNING WITH BREAKFAST. TAKE UNTIL FOLLOW-UP WITH NEUROLOGY.       No current facility-administered medications for this visit.           REVIEW OF SYSTEMS:  Other than those mentioned in the HPI, all other systems reviewed and negative.      PHYSICAL EXAM:     Vitals:    01/11/21 1505   Temp: 37.1 C   Pulse: 81   BP: (!) 145/100   Resp: 20   SpO2: 97%   Height: 6' (1.829 m)   Weight: 62.1 kg (137 lb)   BP 145/100 - he endorses he is in pain right now      General:  General appearance: thin male in no distress  HEENT: Normocephalic, atraumatic, mucous membranes moist, oropharynx clear. +mild temporal wasting b/l   Neck: Supple.  Cardiac: Warm and well perfused, RRR  Pulmonary: Normal work of breathing on ambient air  Skin: No obvious rashes or lesions  Extremities: Warm and well-perfused.  Musculoskeletal: No obvious joint erythema or swelling.     Neurological Examination:  Mental status:  Awake, alert, and oriented to reason for clinic visit.  Follows commands. Fluent speech.     Cranial nerves:  II: Full visual fields  III/IV/VI: EOMI, PERRL  V: Facial sensation intact b/l V1-3  VII: Slightly narrower palpebral fissure on R by ~49m, nonfatiguable, otherwise facial expressions intact and symmetric. No facial weakness - strong symmetric eye closure/cheek puff/smile  VIII: Hearing grossly intact to finger rub b/l.  IX/X: Palate elevates symmetrically, uvula midline. Mild dysarthria  XI: Strength 5/5 in traps and SCM b/l  XII: Tongue protrusion midline, full strength to each side, minimal slowing moving side to side    Motor exam:   Tone normal  Muscle bulk decreased in L deltoid, L pectoralis compared to R, bilateral quads,  b/l buttocks, and b/l paraspinal muscles particularly in thoracic region  R>L scapular winging  Unable to get up from sitting in chair without the use of arms   Difficulty squatting,  unable to get back up without assistance    Neck flexion 4/5  Neck extension 5/5      Delt  Bi  Tri  WE  WF  EDC   IO  FFm  FFu APB  FDI    FPL  ADM   Left 4- _0 Right 4+ _1 HF  KE  KF  DF  PF  EHL   Inv  Ev Add Abd Hip ext    Left _2 4+    Right _3 4+      No grip myotonia  No percussion myotonia at thenar eminence or finger extensors      Sensory: intact to light touch, pinprick, vibration, proprioception throughout     Reflexes:     Bicep  BR Tricep  Patella  Achilles   Left _4 Right _5 Neg Hoffman, no  clonus, no crossed adductors  Toes flexor b/l       Cerebellum: Intact finger to nose (within bounds of weakness on LUE), finger taps and RAM intact   b/l, no tremor or abnormal movements    Gait: Trendelenberg gait b/l, wide base, some difficulty with heel/toe walk but can get about 1-2 inches off the ground  Romberg: Negative       IMPRESSION:  Paul Lloyd. is a 45 year old right-handed male with history of dilated cardiomyopathy who comes in for evaluation of progressive proximal muscle weakness.    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 He does not have facial weakness or myopathic facies. His muscle biopsy and EMG both indicate a myopathic process however the etiology is unclear. He has had extensive work-up done which has been unrevealing. It is unclear whether his cardiomyopathy is related to his other muscular weakness, however it is notable that he has very strong family history of heart disease, often resulting in early age of death from cardiac causes. Though he does not know of any muscle weakness in other family members, this does raise the question of a genetic cause of myopathy. Though his reported presentation is more on the subacute end, it is possible  there could have been very subtle findings that were not notable to the patient prior to 01/2020. It is less likely that he has an inflammatory myopathy partly because of his mildly elevated CK of 300s-400s, as well as his symptoms being unresponsive to steroids and IVIG. There are diseases such as IBM which may not respond to steroids or IVIG, though there were not clear signs of sIBM on muscle biopsy, nor does he have finger flexor weakness on exam, but we can still check NT5c1A antibody to support ruling it out as a consideration. The muscle biopsy findings which include neurogenic changes raise the question of possible myofibrillar myopathy- it may be helpful to get electron microscopy of the specimen to investigate  further. In the meantime, he has been unable to see SLP again due to limitation of providers in his area, and we discussed if it may be beneficial to him to have at least one with a speech therapist at [facility] for evaluation and to try to learn some compensatory mechanisms for his dysphagia. He also has not had formal PFTs so we recommend that for him as well. We also discussed that since the steroids have not helped, we can continue to taper down on prednisone. We also discussed trial of gabapentin for his pain.      PLAN:  1. Decrease prednisone to 31m every other day x2 weeks, then stop  2. Start gabapentin 3065mat bedtime x1 week, then increase to 30046mID x1 week, then 300m55mD thereafter  3. Will request EM evaluation of R gastroc muscle biopsy\  4. Labs: [facility] panel (saliva), NT5c1A antibody testing (serum)  5. PFTs  6. SLP evaluation - will try to schedule PFTs and SLP same day       Follow up in neuromuscular clinic in 2 months, sooner if needed. Will call with results.    Patient expressed understanding of the plan including indications, risks and benefits. All questions answered to patient satisfaction.     Patient seen, examined and discussed with neuromuscular attending, Dr.  WeisMariel Kansky   [place] [place]   Neuromuscular Fellow  Department of Neurology

## 2021-01-12 DIAGNOSIS — G729 Myopathy, unspecified: Secondary | ICD-10-CM | POA: Insufficient documentation

## 2021-01-12 NOTE — Progress Notes (Signed)
I saw and evaluated this patient and I have reviewed the fellow's note.  I agree with the findings and plan as documented.  Exceptions/additions: none.  Jermar Colter David Miki Labuda, MD

## 2021-01-15 NOTE — Addendum Note (Signed)
Addended by: Jonathon Bellows on: 01/15/2021 03:27 PM     Modules accepted: Orders

## 2021-01-23 ENCOUNTER — Telehealth (HOSPITAL_BASED_OUTPATIENT_CLINIC_OR_DEPARTMENT_OTHER): Payer: Self-pay | Admitting: Neurology

## 2021-01-23 NOTE — Telephone Encounter (Signed)
I called patient and LVMTCB.    ===View-only below this line===  ----- Message -----  From: Ladoris Gene, RRT  Sent: 01/18/2021  11:23 AM PDT  To: Randel Pigg, RN  Subject: RE: Neuromuscular patient question               Yes, I could get the pt scheduled at 1:20 on that day, if that works.    Thanks,  Richmond Campbell    ----- Message -----  From: Randel Pigg, RN  Sent: 01/18/2021  10:44 AM PDT  To: Ladoris Gene, RRT  Subject: FW: Neuromuscular patient question               Shari Heritage,    I see that this patient is scheduled for the testing with speech therapy on 9/26.  Do you know if it is possible to try to coordinate PFTs on the same day?    Thanks!  Cori       ----- Message -----  From: Randel Pigg, RN  Sent: 01/14/2021   2:04 PM PDT  To: Mariel Craft, MD, Jonathon Bellows, DO  Subject: RE: Neuromuscular patient question               Hi Dr. Orvis Brill,    It looks like both the PFT and Speech Therapy referral has been placed.  I will be sure to help ensure that patient has COVID-19 testing prior to the PFTs.      I'm not able to schedule either of these tests, but the patient can try to schedule them both on the same day by letting them know when the departments call him to schedule.  I can assist in communicating this if he needs as well.    Thanks!  Cori     ----- Message -----  From: Jonathon Bellows, DO  Sent: 01/11/2021   7:02 PM PDT  To: Mariel Craft, MD, *  Subject: Neuromuscular patient question                   [place],   Dr. Janann August and I were trying to figure out work-up that Mr. Merrell Borsuk needs, and we were trying to figure out how he would get COVID testing pre-PFTs (or with who). Unfortunately [facility] is out right now so we were wondering if you might have any idea? And also do you know if there is any way to schedule the PFTs and an SLP evaluation the same day?? (Patient is traveling up from [place]).   Appreciate any insight you might have.   Thank  you!!    [place]

## 2021-02-04 ENCOUNTER — Encounter (HOSPITAL_BASED_OUTPATIENT_CLINIC_OR_DEPARTMENT_OTHER): Payer: Self-pay | Admitting: Neurology

## 2021-02-04 ENCOUNTER — Other Ambulatory Visit (HOSPITAL_COMMUNITY): Payer: Self-pay

## 2021-02-05 LAB — NT5C1A ANTIBODY, IGG (SENDOUT)
NT5c1A Antibody, IgG Interpretation: NORMAL
NT5c1A Antibody, IgG: NEGATIVE

## 2021-02-06 ENCOUNTER — Encounter (HOSPITAL_BASED_OUTPATIENT_CLINIC_OR_DEPARTMENT_OTHER): Payer: Self-pay | Admitting: Neurology

## 2021-02-08 ENCOUNTER — Telehealth (HOSPITAL_BASED_OUTPATIENT_CLINIC_OR_DEPARTMENT_OTHER): Payer: Self-pay | Admitting: Speech-Language Pathologist

## 2021-02-08 NOTE — Telephone Encounter (Signed)
RETURN CALL: Voicemail - Detailed Message      SUBJECT:  General Message     MESSAGE:     Patient is scheduled for an appointment on   At  And is asking if

## 2021-02-18 ENCOUNTER — Encounter (HOSPITAL_COMMUNITY): Payer: No Typology Code available for payment source

## 2021-02-18 ENCOUNTER — Other Ambulatory Visit (HOSPITAL_BASED_OUTPATIENT_CLINIC_OR_DEPARTMENT_OTHER): Payer: No Typology Code available for payment source | Admitting: Speech-Language Pathologist

## 2021-02-19 ENCOUNTER — Telehealth (HOSPITAL_BASED_OUTPATIENT_CLINIC_OR_DEPARTMENT_OTHER): Payer: Self-pay | Admitting: Neurology

## 2021-02-19 DIAGNOSIS — M6281 Muscle weakness (generalized): Secondary | ICD-10-CM

## 2021-02-19 DIAGNOSIS — G729 Myopathy, unspecified: Secondary | ICD-10-CM

## 2021-02-19 DIAGNOSIS — R0609 Other forms of dyspnea: Secondary | ICD-10-CM

## 2021-02-19 DIAGNOSIS — R0600 Dyspnea, unspecified: Secondary | ICD-10-CM

## 2021-02-19 NOTE — Telephone Encounter (Signed)
===View-only below this line===  ----- Message -----  From: Ladoris Gene, RRT  Sent: 02/15/2021   3:13 PM PDT  To: Randel Pigg, RN, *  Subject: RE: Neuromuscular patient question               Hi Cori,    Whoever is putting in the orders is just writing in "FVC and MIP seated/supine" instead of selecting it along with the other PFTS. The orders need to be entered correctly, otherwise the RT testing the patient isn't able to see what actually is being ordered. Let me know if you need me to walk through how to do that.    Thank you,  Richmond Campbell    ----- Message -----  From: Randel Pigg, RN  Sent: 02/15/2021   9:48 AM PDT  To: Imagene Gurney Ridenour, RN, *  Subject: RE: Neuromuscular patient question               Shari Heritage,    It looks like the PFT referral is now marked as "denied" and I do not see documentation as to why.  Do you happen to know?    Thank you!  Cori     ----- Message -----  From: Ladoris Gene, RRT  Sent: 01/24/2021   3:21 PM PDT  To: Randel Pigg, RN, *  Subject: RE: Neuromuscular patient question               Queen Slough,    It looks like we are booked up now on 9/26.    Thanks,  Richmond Campbell    ----- Message -----  From: Randel Pigg, RN  Sent: 01/23/2021   1:11 PM PDT  To: Imagene Gurney Ridenour, RN, *  Subject: RE: Neuromuscular patient question               Shari Heritage,    It looks like this patient is already scheduled in radiology for the barium swallow study at 1:30 on 9/26.  Is there a different time you might have available on 9/26?  Or are you able to try to call the patient to coordinate?    Thank you!  Cori     ----- Message -----  From: Ladoris Gene, RRT  Sent: 01/18/2021  11:23 AM PDT  To: Randel Pigg, RN  Subject: RE: Neuromuscular patient question               Yes, I could get the pt scheduled at 1:20 on that day, if that works.    Thanks,  Richmond Campbell    ----- Message -----  From: Randel Pigg, RN  Sent: 01/18/2021  10:44 AM  PDT  To: Ladoris Gene, RRT  Subject: FW: Neuromuscular patient question               Shari Heritage,    I see that this patient is scheduled for the testing with speech therapy on 9/26.  Do you know if it is possible to try to coordinate PFTs on the same day?    Thanks!  Cori     ----- Message -----  From: Jonathon Bellows, DO  Sent: 01/14/2021   2:58 PM PDT  To: Mariel Craft, MD, *  Subject: RE: Neuromuscular patient question               Thank you Brett Albino!     ----- Message -----  From: Mariel Craft, MD  Sent: 01/14/2021  2:17 PM PDT  To: Randel Pigg, RN, Jonathon Bellows, DO  Subject: RE: Neuromuscular patient question               Thanks Cori      ----- Message -----  From: Randel Pigg, RN  Sent: 01/14/2021   2:04 PM PDT  To: Mariel Craft, MD, Jonathon Bellows, DO  Subject: RE: Neuromuscular patient question               Hi Dr. Orvis Brill,    It looks like both the PFT and Speech Therapy referral has been placed.  I will be sure to help ensure that patient has COVID-19 testing prior to the PFTs.      I'm not able to schedule either of these tests, but the patient can try to schedule them both on the same day by letting them know when the departments call him to schedule.  I can assist in communicating this if he needs as well.    Thanks!  Cori     ----- Message -----  From: Jonathon Bellows, DO  Sent: 01/11/2021   7:02 PM PDT  To: Mariel Craft, MD, *  Subject: Neuromuscular patient question                   Queen Slough,   Dr. Janann August and I were trying to figure out work-up that Mr. Abubakr Wieman needs, and we were trying to figure out how he would get COVID testing pre-PFTs (or with who). Unfortunately [facility] is out right now so we were wondering if you might have any idea? And also do you know if there is any way to schedule the PFTs and an SLP evaluation the same day?? (Patient is traveling up from [place]).   Appreciate any insight you might have.   Thank you!!    Clover

## 2021-02-25 ENCOUNTER — Telehealth (HOSPITAL_BASED_OUTPATIENT_CLINIC_OR_DEPARTMENT_OTHER): Payer: No Typology Code available for payment source | Admitting: Speech-Language Pathologist

## 2021-02-25 NOTE — Telephone Encounter (Signed)
 Ambulatory Care Navigator [facility] Progress Note  02/15/2021  12:42 PM      Patient Situation Updates:    SW received message from patient that he needs a ride to an appt in [place] on Monday. SW let patient know that the Lyft rides only go 25 miles. SW said that she would need additional time for scheduling something across counties. SW said to let her know about next appt and she would do her best to accommodate.     Interventions Completed:    Discussed transportation needs    Plan / Follow Up Needs:    SW to talk with patient again regarding transportation needs    Littie Deeds, [facility]  Ambulatory Care Navigator, Social Worker  Phone: [phone]

## 2021-03-19 NOTE — Telephone Encounter (Signed)
See also more recent communication with patient in 02/04/21 encounter.

## 2021-03-22 ENCOUNTER — Other Ambulatory Visit
Admission: RE | Admit: 2021-03-22 | Discharge: 2021-03-22 | Disposition: A | Discharge status: Home / Self Care | Payer: No Typology Code available for payment source | Source: Ambulatory Visit | Attending: Neuropathology | Admitting: Neuropathology

## 2021-03-22 DIAGNOSIS — M6289 Other specified disorders of muscle: Secondary | ICD-10-CM | POA: Insufficient documentation

## 2021-03-28 LAB — EXTERNAL NEUROPATH REVIEW

## 2021-05-01 ENCOUNTER — Encounter (HOSPITAL_BASED_OUTPATIENT_CLINIC_OR_DEPARTMENT_OTHER): Payer: Self-pay | Admitting: Neurology

## 2021-05-10 ENCOUNTER — Telehealth (HOSPITAL_BASED_OUTPATIENT_CLINIC_OR_DEPARTMENT_OTHER): Payer: Self-pay | Admitting: Neurology

## 2021-05-10 ENCOUNTER — Telehealth (HOSPITAL_BASED_OUTPATIENT_CLINIC_OR_DEPARTMENT_OTHER): Payer: Self-pay | Admitting: Clinical

## 2021-05-10 DIAGNOSIS — G729 Myopathy, unspecified: Secondary | ICD-10-CM

## 2021-05-10 DIAGNOSIS — M6281 Muscle weakness (generalized): Secondary | ICD-10-CM

## 2021-05-10 DIAGNOSIS — R0609 Other forms of dyspnea: Secondary | ICD-10-CM

## 2021-05-10 NOTE — Telephone Encounter (Addendum)
06/17/2021  MSW made follow up calls and left messages for Bailee and Deberah Castle, [facility] and [facility]. Aadam returned call and informed that he has contacted Ms. Wiley and assessment for in-home services is currently scheduled on 08/30/2021. Ms. Paris Lore will move the assessment up if she has earlier availability.         05/29/2021  Patient's assigned [facility]CS Case Manager is Deberah Castle [phone]). MSW left message for Ms. Wiley offering assistance with care coordination. Provided Ms. Wiley's contact information to Yohan.        05/23/2021  Breton has not yet been contacted by Home and [facility]. MSW left message on the intake line with request for status update.          05/13/2021  Frans stated that he is not interested in home health PT at this time. He reported having a recent bad experience with outpatient PT. The PT did not understand his diagnosis, and treatments caused a great deal of pain. Chesky is not wanting to repeat this experience. He is most interested in getting help around the house.     MSW confirmed that [facility] and [facility] [phone]) received Intake and Referral form and medical statement faxed 05/10/2021. Referral is being processed and will be assigned to a worker for assessment for in-home services.          05/10/2021  MSW received referral from Dr. Orvis Brill with request to arrange home health PT, [facility]. Patient is having increasing difficulty with ADLs. He lives in [place] and has [employer].    MSW spoke with Senan by phone. Discussed home health referral and programs for in-home care support. Makani indicated interest in home health and caregiver support. He wants to work with a particular home health agency but cannot remember the name so stated need to get back to MSW. Daijon informed that he moved in October and has a new address: [address] [place], [place], [place] [address].     MSW initiated referral to the [facility] and  [facility] office (fax: [phone]) for CFC/COPES CARE assessment for in-home care support.    Will initiate home health referral to patient's preferred agency. Awaiting return call.

## 2021-05-10 NOTE — Telephone Encounter (Signed)
Patient with significant progressive proximal weakness requiring home PT support and home health aide. Will place home services referral.     Discussed with attending Dr. Janann August

## 2021-05-31 ENCOUNTER — Ambulatory Visit: Payer: No Typology Code available for payment source | Attending: Neurology | Admitting: Neurology

## 2021-05-31 VITALS — BP 138/88 | HR 99 | Ht 72.0 in | Wt 149.0 lb

## 2021-05-31 DIAGNOSIS — M7918 Myalgia, other site: Secondary | ICD-10-CM | POA: Insufficient documentation

## 2021-05-31 DIAGNOSIS — G729 Myopathy, unspecified: Secondary | ICD-10-CM | POA: Insufficient documentation

## 2021-05-31 DIAGNOSIS — R1310 Dysphagia, unspecified: Secondary | ICD-10-CM | POA: Insufficient documentation

## 2021-05-31 MED ORDER — PREGABALIN 75 MG OR CAPS
75.0000 mg | ORAL_CAPSULE | Freq: Two times a day (BID) | ORAL | 5 refills | Status: DC
Start: 2021-05-31 — End: 2021-06-03

## 2021-05-31 NOTE — Progress Notes (Signed)
I saw and evaluated this patient and I have reviewed the fellow's note.  I agree with the findings and plan as documented.  Exceptions/additions: none.  Dashawn Bartnick David Layna Roeper, MD

## 2021-05-31 NOTE — Progress Notes (Addendum)
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  He is accompanied by Paul Lloyd, his partner/fiancee.   History is per patient, partner and chart review.    HPI:  Briefly, he reports onset of severe muscle aching and cramping since September 2021. Soon after, he began to have proximal leg weakness, and had proximal arm involvement beginning around October 2021. This has been progressive and he had also started to have dysphagia for which he was admitted in January 2022. 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 MRI of neuraxis and extensive lab testing including LP has been unrevealing. After muscle biopsy was completed, he was tried on the following therapies: corticosteroids (IV solumedrol x3 days then high dose prednisone 42m which was tapered down over several months to 133mdaily), IVIG (07/2020).  He saw PT initially but stopped 07/2020 as he could not tolerate muscle pain during sessions. In regards to his dilated cardiomyopathy, he was diagnosed in 2018 when he was admitted for pneumonia, and during work-up they discovered that he had reduced ejection fraction. He reported that this had improved, as his ECHO in 06/22/2019 showed EF 35-40%, but a TTE 10/25/2020 showed EF 51%. He has no known FH of muscle or neuromuscular disease, but he has very strong family history of cardiac disease. He does have personal history of polysubstance abuse including ETOH.     Interval History:  He was last seen 01/11/2021. At that time, he was having progressive proximal weakness in all extremities with multiple falls. He was requiring help with some ADLs  (showering/bathing, cooking; independent in bathing/toileting/feeding himself), and was ambulating with a cane.  He endorsed progressive dyspnea on exertion and orthopnea. He had dysphagia with liquids more than solids. He had some worse hoarseness of voice but no dysarthria. He continued to have significant pain in his muscles, and he had reported not trying gabapentin/Lyrica/duloxetine in the past. His pain managed by PCP. He takes oxycodone as needed and over the counter medications as needed (tylenol, ibuprofen). As his clinical picture (including lack of response to steroids and IVIG) and CK level did not fit an inflammatory myopathy, he was tapered off of prednisone. We started gabapentin for him, with goal of 30044mID. Labs including Invitae neuromuscular disease panel and NT5c1A testing were both negative. We requested EM evaluation of the prior muscle biopsy which showed presence of myofibrillar disorganization and rods. It is currently pending external consultation.   He did not get formal PFTs done, nor was he able to get SLP evaluation here- this was due to transportation issues. Now he is able to get transportation in place.     Today he reports that weakness is slowly progressing but not too much worse. He has fallen 2 times since October, both were when trying to transfer in/out of the shower. We referred him for home services for physical therapy and home health aide. He is waiting to pick a home healthcare service. He had pain with muscle strengthening exercises with PT in the past, and our focus would be fall-prevention (including recommended home equipment) more than anything else this time with PT. He is currently using a cane for mobility and this works well for  him - denies falls outside the home. His ADLs ability is approximately the same, but he states that certain tasks such as dressing and getting of the toilet do get progressively harder for him. He endorses continued dysphagia to  liquids>solids which is stable from last visit. It is almost with every meal. Denies any serious choking events. He continues to have dyspnea on exertion even with shorter distances (like going down a clinic hallway) and significant orthopnea, both of which he feels is worsening. He reports ongoing pain which is about the same. He tried gabapentin but he stopped because he was getting dark marks/rash in September around 1 month after starting. He continues to have aching pain in all limbs and cramping in the neck and back.       Medical Hx: No past medical history on file.    Surgical Hx: No past surgical history on file.    Social Hx:   Social History     Socioeconomic History    Marital status: Significant other     Spouse name: Not on file    Number of children: Not on file    Years of education: Not on file    Highest education level: Not on file   Occupational History    Not on file   Tobacco Use    Smoking status: Not on file    Smokeless tobacco: Not on file   Substance and Sexual Activity    Alcohol use: Not on file    Drug use: Not on file    Sexual activity: Not on file   Other Topics Concern    Not on file   Social History Narrative    Not on file     Social Determinants of Health     Financial Resource Strain: Not on file   Food Insecurity: Not on file   Transportation Needs: Not on file   Physical Activity: Not on file   Stress: Not on file   Social Connections: Not on file   Intimate Partner Violence: Not on file   Housing Stability: Not on file     Lives with Paul Lloyd    Allergies:   Review of patient's allergies indicates:  Allergies   Allergen Reactions    Lisinopril Resp: Cough       Current medication:   Current Outpatient Medications   Medication Sig Dispense Refill    acetaminophen 325 MG tablet Take 2 tablets by mouth every 6 hours as needed.      Docusate Sodium (DSS) 100 MG capsule Take 2 capsules by mouth 2 times a day.      gabapentin 300 MG capsule Take 1 capsule (300  mg) by mouth 3 times a day. Take 1 capsule at bedtime x 1 week, then increase to 1 capsule twice daily, then increase to 1 capsule three times per day 90 capsule 5    ibuprofen 600 MG tablet Take 600 mg by mouth.      oxyCODONE 10 MG tablet Take 1 tablet by mouth every 6 hours.      polyethylene glycol 3350 17 GM/SCOOP oral powder Take 17 g by mouth.      predniSONE 10 MG tablet TAKE 3 TABLETS BY MOUTH EVERY MORNING WITH BREAKFAST. TAKE UNTIL FOLLOW-UP WITH NEUROLOGY.       No current facility-administered medications for this visit.           REVIEW OF SYSTEMS:  Other than those mentioned in the HPI, all  other systems reviewed and negative.      PHYSICAL EXAM:     Vitals:    05/31/21 1608   Pulse: 99   BP: 138/88   SpO2: 90%   Height: 6' (1.829 m)   Weight: 67.6 kg (149 lb)         General:  General appearance: thin male in no distress  HEENT: Normocephalic, atraumatic, mucous membranes moist, oropharynx clear.   Musculoskeletal: No obvious joint erythema or swelling.     Neurological Examination:  Mental status:  Awake, alert, and oriented to reason for clinic visit.  Follows commands. Fluent speech.     Cranial nerves: cannot bury left eye on abduction but otherwise EOMI, Mild facial weakness in eye closure and cheek puff. Hearing grossly intact to finger rub b/l. No dysarthria. Tongue protrusion midline, full strength to each side, minimal slowing moving side to side    Motor exam:   Bilateral scapular winging, slightly worse on R; equivocal triple hump sign R>L  Unable to get up from sitting in chair without the use of arms     Neck flexion 4/5  Neck extension 5/5      Delt  Bi  Tri  WE  WF  EDC   IO APB   Left '4 5 5 5 5 5 5 5   ' Right '4 5 5 5 5 5 5 5       ' HF  KE  KF  DF  PF  EHL   Inv  Ev   Left '4 5 5 ' 5- '5 5 5 5   ' Right '4 5 5 5 5 5 5 5       ' Sensory: intact to light touch throughout     Reflexes: 2+ in bilateral biceps, brachioradialis, triceps, patellae, ankles. Toes downgoing.    Gait: Walks with cane.  Mild Trendelenberg gait, wide base      Past work-up:  Labs   Myositis panel negative  HMGCR neg  CSF neg  ACHR neg  MUSK neg  T. Pallidum neg  A1c 5.9  LFTs wnl  CK 444 (06/09/2020), 357 (06/12/2020)  Hep B/C panels neg  HIV neg  TSH wnl   CSF testing (WBC 1, RBC 8, protein 33, glucose 75, gram neg, oligoclonal bands neg)  ANA neg  CRP 0.6  ESR 20 --> 15  SSA/SSB neg  UA wnl, no myoglobinuria noted      EDX: 06/28/2020 (Dr. Derryl Harbor)  EMG FINDINGS: Nerve conduction study demonstrated normal motor response in right median, ulnar, peroneal, and tibial nerves. Sensory nerve conduction study demonstrated normal sensory response in the right radial, peroneal, and sural nerves. EMG concentric needle examination demonstrates myopathic changes in proximal muscles. No acute denervation is seen in all the muscles examined.    INTERPRETATION: Abnormal study. 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 There is no evidence to suggests peripheral polyneuropathy in either upper or lower extremities. There is no evidence to suggests motor neuron disease.         MRI R femur 06/14/2020  IMPRESSION:   1. Heterogeneous muscle edema and enhancement in the right adductor muscle   group, quadriceps muscles, tensor fascia lata, and hamstring muscles.     2. 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 Please correlate clinically.  Muscle Biopsy (full report under Pathology) 06/15/2020  NEUROMUSCULAR PROCESS WITH NECROTIZING/REGENERATING AND NEUROGENIC FEATURES  This right quadriceps muscle biopsy is involved by a process characterized by variably prominent and clustered myonecrosis and myoregeneration as well as features of neurogenic atrophy (small angular esterase rich fibers and nuclear bags). Features such as  hypertrophy/hypotrophy, focally increased centralized myonuclei, focal endomysial fibrosis and split/whorled fibers are suggestive of chronicity. There is no significant endomysial/perimysial lymphocytic inflammation, T-lymphocytic infiltration of non-necrotic myofibers or diffuse and robust HLA-I upregulation. Despite necrosis/regeneration macrophage infiltration of the endomysium is modest. There is no increased alkaline phosphatase staining, perifascicular necrosis, perifascicular atrophy, rimmed vacuoles or pTDP43/TDP43 immunoreactive aggregates. The clinical concern of myositis is noted. Absence of some of the aforementioned features argue against a firm diagnosis of entities such as polymyositis/overlap myositis, sporadic inclusion body myositis (sIBM), immune myopathies with prominent perimysial pathology (some of which may arise in the setting of  antisynthetase syndromes) and some dermatomyopathies. Features of active vasculitis or granulomatous disease are not noted. Acid phosphatase does not show diagnostic evidence of a myopathy with autophagic vacuoles (including acid maltase deficiency; confirmed on PAS). A limited panel of immunohistochemical stains to examine for the possibility of a dystrophinopathy or some of the more common forms of limb-girdle muscular dystrophy (LGMD) or myofibrillar myopathies did  not show diagnostic alterations.    ADDENDUM   ELECTRON MICROSCOPY:  Micrographs from ultrastructural studies show the following features:  Myofiber architecture-    The sarcolemmal membranes and the basal lamina are largely well preserved in viable fibers.  In viable myofibers the myofibrils show generally well-organized sarcomeric architecture with parallel alternating thick (myosin) and thin (actin) filaments and well-formed z-bands. Multifocal myofibrillary interruption is noted often containing amorphous material admixed with Z-band material/rods (see below).       Mitochondria-   Mitochondria  show mild to moderate size variation (focal swelling/hydropic changes). Mitochondria are present in nearly normal numbers and show normal stacking of cristae.  Mitochondrial paracrystalline "parking lot" inclusions and dense round inclusions are not present.      Sarcoplasmic aggregates-   Definite rimmed vacuoles are not identified.   Multifocal rod material is present in viable and degenerated fibers (see above). Definite cylindrical spirals and tubular aggregates are not identified.  Other inclusions/aggregates such as fingerprint, filamentous, curvilinear, or cytoplasmic bodies are not identified.      Sarcoplasmic storage:  Lipid and lipofuscin content appears within normal limits.  Glycogen content appears within normal limits without definite examples of membrane bound glycogen.  The sarcotubular system is inconspicuous.    ADDENDUM COMMENT:  This case is awaiting external consultation. The presence of myofibrillar disorganization and rods raises consideration of rod myopathies and some myofibrillar/dystrophic process. Clinical correlation is recommended.       Swallow Eval 06/15/2020   No oropharyngeal dysphagia concern noted. Suspect esophageal dysfunction due to patient reported discomfort, globus sensation with liquid intake, and report of reflux of liquids earlier today.      XR Esophogram/Barium Swallow 10/24/2020  IMPRESSION: Flash laryngeal penetration without aspiration is observed.    Otherwise unremarkable double contrast esophagram as described above.       10/25/2020 TTE  The left ventricular chamber size is normal. The left ventricular systolic function is borderline reduced.  The calculated ejection fraction is 51% by 3D. Global longitudinal strain (GLS) is -18.4%. There is no  evidence of left ventricular hypertrophy. Spectral Doppler is indicative of a impaired relaxation (stage 1)  filling pattern. The left atrium  is mildly dilated.        IMPRESSION:  Paul Lloyd. 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  We have had his muscle biopsy reviewed again to evaluate ultrastructure and it showed presence of myofibrillar disorganization and rods, which raised the consideration of rod myopathies and some myofibrillar/dystrophic process. We discussed that the final evaluation is pending expert consultation. We also discussed the possibility of FSHD as a potential diagnosis given his additional features of facial weakness and scapular winging. We have already ruled out FSHD type 2 on Invitae panel, but FSHD type 1 is not on this panel and given his clinical presentation, should be evaluated genetically. The indications and significance of the potential results of genetic testing including appropriate counseling was done with the patient at this visit.      PLAN:  1. Lyrica 33m BID for pain   2. FSHD1 testing - send out lab to [facility] will apply to get pre-authorization from insurance  3. Follow up EM external consultation   4. Home PT - for fall prevention, balance training, adaptive equipment  5. PFTs- seated and supine  6. SLP evaluation - will try to schedule PFTs and SLP same day   7. If FSHD1 testing and EM of muscle biopsy are not revealing, we will consider a repeat EMG and possibly myotonic dystrophy type 2 testing, though this is less likely    Will call with results.  Return for PFTs and speech/swallow therapy evaluation in 1 month; also get serum for FSHD1 testing then- our office will help facilitate.  Telemedicine appointment in 2 months   If needed, can return for repeat EMG. Will discuss this possibility further if needed at the telemedicine follow-up.     Patient expressed understanding of the plan including indications, risks and benefits. All questions answered to patient satisfaction.     Patient seen, examined and discussed with neuromuscular attending, Dr. WMariel Kansky      CVallery Ridge DO   Neuromuscular  Fellow  Department of Neurology

## 2021-05-31 NOTE — Addendum Note (Signed)
Addended by: Jonathon Bellows on: 05/31/2021 05:53 PM     Modules accepted: Orders

## 2021-05-31 NOTE — Patient Instructions (Addendum)
It was a pleasure to see you again today.     The plan is:  1. Lyrica 75mg  twice a day for your pain. Start with 1 tablet daily for about a week, and you are doing well on it and not having any side effects, you can increase to 1 tablet twice per day (for example around breakfast time and dinner time).    2. We will send lab testing for FSHD 1 (facioscapulohumeral muscular dystrophy). Type 2 was already ruled out by your genetic test from last visit. As discussed this could be a potential diagnosis for you, but the test will help rule it in or out.  We will apply to get pre-authorization from insurance, so don't give blood for this today. Once you have provided the blood sample for the test, it may take about a month for the results to come back.     3. We will follow up the muscle biopsy external consultation and let you know when we hear updates    4. Home PT : this will be more for fall prevention, balance training, and adaptive equipment, not so much for muscle strengthening, as that has caused you pain before.    5. Lung function tests and speech/swallow evaluation by a speech therapist - our office will help facilitate scheduling these for the same day. The plan is to also give blood for the FSHD testing on that same visit day.     6. If the above tests and the muscle biopsy final results are not revealing, we can consider a repeat EMG to help further with diagnosis    We will contact you with results.  We will aim for the lung function tests and speech/swallow therapy evaluation in 1 month;  Let's plan for a Telemedicine follow-up appointment with Dr. and Dr. Orvis Brill in 2 months, by which time hopefully we will have all of the test results back.   If needed, can return for repeat EMG. Will discuss this possibility further if needed at the telemedicine follow-up.     Contact Janann August by MyChart for any questions or concerns.

## 2021-06-03 ENCOUNTER — Other Ambulatory Visit (HOSPITAL_BASED_OUTPATIENT_CLINIC_OR_DEPARTMENT_OTHER): Payer: Self-pay | Admitting: Neurology

## 2021-06-03 DIAGNOSIS — M7918 Myalgia, other site: Secondary | ICD-10-CM

## 2021-06-03 MED ORDER — PREGABALIN 75 MG OR CAPS
75.0000 mg | ORAL_CAPSULE | Freq: Two times a day (BID) | ORAL | 5 refills | Status: DC
Start: 2021-06-03 — End: 2021-07-24

## 2021-06-03 MED ORDER — PREGABALIN 75 MG OR CAPS
75.0000 mg | ORAL_CAPSULE | Freq: Two times a day (BID) | ORAL | 5 refills | Status: DC
Start: 2021-06-03 — End: 2021-06-03

## 2021-06-21 ENCOUNTER — Encounter (HOSPITAL_BASED_OUTPATIENT_CLINIC_OR_DEPARTMENT_OTHER): Payer: Self-pay | Admitting: Neurology

## 2021-06-21 NOTE — Telephone Encounter (Addendum)
Hx dilated cardiomyopathy and myopathy.    Last clinic visit with Dr. Orvis Brill for f/u myopathy, severe muscle aching and cramping and progressive proximal weakness 4 extremities.     EDX: 06/28/2020 (Dr. Josefa Half)  INTERPRETATION: Abnormal study. 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 There is no evidence to suggests peripheral polyneuropathy in either upper or lower extremities. There is no evidence to suggests motor neuron disease.    TC to [facility] to discuss new motor/sensory symptoms right hand: Last week he awoke with right arm numbness, pins and needles. He thought he slept on it wrong. Pins and needles resolved in right arm after 3 hours, but he noticed residual numbness in all fingertips and a weak grip right hand. These symptoms have not improved. The R arm symptoms come back transiently if he is in the wrong position at night. No change in color or temperature of right arm/hand/fingers. No pain.     Routed to Dr. Orvis Brill for clinical guidance      ..........................................................Marland Kitchen  Maura Crandall PhD RN  Ambulatory Float Team

## 2021-07-08 NOTE — Telephone Encounter (Signed)
Ambulatory Care Navigator [facility] Progress Note  06/04/2021  2:52 PM      Patient Situation Updates:    On 06/04/21 SW received inbasket message in Epic from PCP asking if SW could help PCP coordinate seeing if neurology can refer to SLP/ blood draw for the specialized test at a location closer to home rather than going to [place]? SW said that she would try to assist. Patient was previously engaged with SW through [place] order.      Interventions Completed:    Message exchange with PCP      Plan / Follow Up Needs:    SW to call patient to discuss at a later date      Littie Deeds, [facility]  Ambulatory Care Navigator, Social Worker  Phone: [phone]

## 2021-07-24 ENCOUNTER — Other Ambulatory Visit (HOSPITAL_BASED_OUTPATIENT_CLINIC_OR_DEPARTMENT_OTHER): Payer: Self-pay | Admitting: Neurology

## 2021-07-24 DIAGNOSIS — M7918 Myalgia, other site: Secondary | ICD-10-CM

## 2021-07-25 MED ORDER — PREGABALIN 75 MG OR CAPS
75.0000 mg | ORAL_CAPSULE | Freq: Two times a day (BID) | ORAL | 5 refills | Status: DC
Start: 2021-07-25 — End: 2021-12-27

## 2021-07-25 NOTE — Telephone Encounter (Signed)
Please see Mychart message pt is requesting for dose increase

## 2021-08-12 ENCOUNTER — Telehealth (HOSPITAL_BASED_OUTPATIENT_CLINIC_OR_DEPARTMENT_OTHER): Payer: Self-pay | Admitting: Clinical

## 2021-08-12 NOTE — Telephone Encounter (Signed)
RETURN CALL: Voicemail - Detailed Message      SUBJECT:  General Message     MESSAGE: Patient states that he is attempting to get in contact with [facility] patient states that he had received a new phone and lost his contacts. CCR unable to connect with clinic. Please follow up.

## 2021-08-19 ENCOUNTER — Encounter (HOSPITAL_BASED_OUTPATIENT_CLINIC_OR_DEPARTMENT_OTHER): Payer: Self-pay | Admitting: Neurology

## 2021-08-23 ENCOUNTER — Other Ambulatory Visit (HOSPITAL_BASED_OUTPATIENT_CLINIC_OR_DEPARTMENT_OTHER): Payer: Self-pay | Admitting: Neurology

## 2021-08-23 DIAGNOSIS — N529 Male erectile dysfunction, unspecified: Secondary | ICD-10-CM | POA: Insufficient documentation

## 2021-08-29 ENCOUNTER — Encounter (HOSPITAL_BASED_OUTPATIENT_CLINIC_OR_DEPARTMENT_OTHER): Payer: Self-pay

## 2021-09-02 ENCOUNTER — Telehealth (HOSPITAL_BASED_OUTPATIENT_CLINIC_OR_DEPARTMENT_OTHER): Payer: Self-pay

## 2021-09-02 NOTE — Telephone Encounter (Signed)
RETURN CALL: Voicemail - Detailed Message      SUBJECT:  Cancellation/Reschedule Request     REASON: Unavailable  ADDITIONAL INFORMATION:     The patient is calling to reschedule his clinical support appointment with the pulmonary respiratory therapist for 4/12     Please assist

## 2021-09-03 ENCOUNTER — Encounter (HOSPITAL_BASED_OUTPATIENT_CLINIC_OR_DEPARTMENT_OTHER): Payer: Self-pay | Admitting: Neurology

## 2021-09-04 ENCOUNTER — Encounter (HOSPITAL_BASED_OUTPATIENT_CLINIC_OR_DEPARTMENT_OTHER): Payer: No Typology Code available for payment source | Admitting: Speech Language Pathology

## 2021-09-04 ENCOUNTER — Ambulatory Visit (HOSPITAL_BASED_OUTPATIENT_CLINIC_OR_DEPARTMENT_OTHER): Payer: No Typology Code available for payment source

## 2021-09-25 ENCOUNTER — Encounter (HOSPITAL_BASED_OUTPATIENT_CLINIC_OR_DEPARTMENT_OTHER): Payer: Self-pay | Admitting: Speech Language Pathology

## 2021-09-25 ENCOUNTER — Ambulatory Visit: Payer: No Typology Code available for payment source | Attending: Neurology | Admitting: Speech Language Pathology

## 2021-09-25 ENCOUNTER — Ambulatory Visit: Payer: No Typology Code available for payment source | Attending: Neurology

## 2021-09-25 DIAGNOSIS — G729 Myopathy, unspecified: Secondary | ICD-10-CM | POA: Insufficient documentation

## 2021-09-25 DIAGNOSIS — R1310 Dysphagia, unspecified: Secondary | ICD-10-CM | POA: Insufficient documentation

## 2021-09-25 NOTE — Progress Notes (Signed)
Speech Language Pathology  Outpatient Clinical Swallow Evaluation    Sep 25, 2021  Time in: 0910  Duration/min: 45 minutes     General Assessment  Certification From*: 92/11/94  Certification To*: 17/40/81  Date of Symptom Onset*: 05/31/21  Reason for Referral*: dysphagia  Referring Provider: Danna Hefty, MD       Patient: Paul Lloyd.  MRN: [id number]  Encounter Provider: Gillian Scarce, SLP  Treatment/Therapy Diagnosis:   Problem List Items Addressed This Visit        Rehab Therapy (PT, OT, Speech, Recreational)    Dysphagia       Musculoskeletal and Injuries    Myopathy - Primary        CLINICAL IMPRESSIONS  The patient is a 46 year old male with a history of myopathy who presents clinically with mild-moderate oral phase and mild-moderate pharyngeal dysphagia with greater impact to swallowing efficiency than safety. This is characterized by reduced lingual strength, suspected reduced cricopharyngeal strength, resulting in feelings of stasis, coughing with mixed consistencies and cough with successive swallows of thin liquids.  Paul Lloyd and his SO Hilda Blades both report he also coughs a lot at night.   Recommend sleeping with wedge pillow or in more of an upright position, avoid mixed consistencies, good oral care.  I do not recommend any diet modifications at this time, but did recommend the Shaker exercise for cricopharyngeal strength and the Masako maneuver for lingual strength.    History of Present Illness:  Paul Lloyd is a 46 year old male with a history of myopathy.     SUBJECTIVE  Paul Lloyd is a pleasant 46 year old male who attends the visit with his SO Hilda Blades. Paul Lloyd and Hilda Blades report that he has difficulty with swallowing cereal, and that he gets food stuck in his throat frequently, which leads to coughing and spitting the food up.  Reviewed esophagram from 2022 that noted flash penetration, otherwise no esophageal issues.  However, despite the instrumental results, Paul Lloyd continues to have  functional difficulties with his swallow.  He notes weight loss, but no recent pneumonia.  He has a weak cough, and [name] notes his voice is hoarse compared to his "normal."     OBJECTIVE    Functional Oral Intake Scale (FOIS)Swallowing: Level 6:  Total oral diet with multiple consistencies without special preparation, but with specific food limitations.  Performance Status Scale - Head and Neck (PSS-HN):  Normalcy of Diet: 90 - Full diet (liquid assist) (0-100, 0=non-oral)  Public Eating: 149 - No restriction of place, food, or companion (eats out at any opportunity) (0-100, 0= Always eats alone, 100 = No restriction of place, food or companion)  Understandability of Speech: 75 - Understandable most of the time; occasional repetition necessary (0-100, 0 =Never understandable)  Feeding Tube Status: No - Exclusive nutrition/hydration by mouth         Oral Mechanism Examination:   Facies: symmetrical, bilateral labial retraction weakness  Labial Findings: weak, reduced labial seal and retraction  Lingual Findings: reduced strength bilaterally  Velar Findings: WFL  Dentition: Fully dentate    Motor Speech Examination:  Speech Intelligibility: 90-100% in quiet environment, with mask use  Dysarthria: mild  Voice: hoarse    Swallowing:  Bolus Trials Administered:  single sips thin liquids, continuous sips thin liquids, applesauce, cracker, cracker with peanut butter  Oral Phase Findings: Incomplete oral bolus transit  Pharyngeal Phase Findings: Reduced hyolaryngeal movement per palpation; Chronicity: Chronic (> 50% of occurrences), Cough; Chronicity:  Intermittent (< 50% of occurrences), Foreign body sensation; Chronic (> 50% of occurrences) and Multiple swallows; Chronicity: Chronic (> 50% of occurrences)  Successful Swallowing Strategies Implemented: small bites/sips  Unsuccessful Swallowing Strategies Implemented: No strategies or restrictions required    Swallow Trials:   Substance/delivery system Cough/choke Voice  Quality Comments   Liquid- water by cup With consecutive, large swallows No change    Puree/applesauce by spoon No  No change    Solids (cracker/cracker with peanut butter) Feeling of stasis, no cough No change        ASSESSMENT  Please refer to impressions statement above.    Skilled Services Provided: Swallowing evaluation CPT [phone]  Rehabilitation potential is: Fair  Potential Barriers to achieve rehab goals: none  Preferred Learning style: demonstration, written and verbal  Cultural Practices that Influence Care: none    PLAN/RECOMMENDATIONS  Follow Up/Further Dysphagia Intervention Indicated: as needed.  Paul Lloyd was given some swallowing exercises to try at home.  Transportation up to [place] is difficult so I encouraged him to reach out for a telehealth visit if he experiences any changes or has questions  Solid Recommendations: regular, as tolerated Multiple swallows, Effortful swallow  Liquid Recommendations: regular, thin as tolerated  No strategies or restrictions required, small sips  Medication: with regular, thin liquids and with puree  Swallowing Exercises: Masako, Shaker  General Aspiration Precautions: eating/drinking in an upright position only when awake and alert, excellent oral hygiene before and after meals    DISCHARGE GOALS: Functional Discharge Goals are numbered and Short Term Goals are lettered:     1. Patient will tolerate least restrictive diet with no signs/symptoms of aspiration:    1a. Evaluate swallow status goal met   1b. Patient will utilize swallow management techniques with minimal cues   1c. Pt. will carry out swallowing exercise program as developed by treating SLP goal initiated   1d. Patient will verbalize pulmonary hygiene recommendations for maintaining pulmonary health goal initiated        The plan outlined above was formulated, reviewed, and agreed upon with the patient and/or family/caregiver. Patient/caregiver verbalized understanding and restated information in  plan.    Referring Physician/Practitioner Certification Statement:  I Certify the need for these services furnished under this plan of treatment and while under my care.    Winona Legato M.S., CCC-SLP  [email or web address]

## 2021-09-25 NOTE — Progress Notes (Signed)
Sitting  Vital Capacity Predicted: 5.0 L  Vital Capacity Measured: 3.89 L  Percent Predicted: 77%  Full PPE used.      MIP: -85 cmH20    PCF: 220 L/min    Comments: Ariv is here today for baseline lung function.  He doesn't complain of any morning headaches.  His wife reports that he snores when on his back (history of OSA with both parents), however when on his side, he sleeps well with 2 pillows.  No complaints of orthopnea.  He endorses that he has aspiration episodes and it is difficult for him to cough and clear at times.  His PCF reflects this.  I will recommend a cough assist device be ordered for [place].     NO:  Patient qualifies for assisted ventilation based on the restrictive pulmonary disease guidelines of FVC < 50% or MIP < -60 cwp.    YES:  Patient qualifies for cough assist device based on peak flow < 260 L/s, MIP < -60 cwp.    Homecare Company: TBD    Practitioner performing test: Elvina Mattes, RRT

## 2021-10-07 ENCOUNTER — Encounter (HOSPITAL_BASED_OUTPATIENT_CLINIC_OR_DEPARTMENT_OTHER): Payer: Self-pay | Admitting: Neurology

## 2021-10-15 ENCOUNTER — Telehealth (HOSPITAL_BASED_OUTPATIENT_CLINIC_OR_DEPARTMENT_OTHER): Payer: Self-pay | Admitting: Neurology

## 2021-10-15 NOTE — Telephone Encounter (Signed)
RETURN CALL: Voicemail - Detailed Message      SUBJECT:  Appointment Request     REASON FOR VISIT: Overdue follow up. Return in about 10 weeks (around 08/09/2021) for routine follow-up, over telemedicine with Dr. Orvis Brill and Dr. Janann August, per office visit note on 05/31/21.  PREFERRED DATE/TIME: Open to first available  ADDITIONAL INFORMATION: CCR unable to access any available Telemedicine Visits on Dr. Elease Etienne current schedule template. CCR attempted to warm transfer to front desk, but they were busy assisting others.

## 2021-11-04 ENCOUNTER — Telehealth (HOSPITAL_BASED_OUTPATIENT_CLINIC_OR_DEPARTMENT_OTHER): Payer: Self-pay

## 2021-11-04 NOTE — Telephone Encounter (Signed)
TC from pt who reports he had not been able to have the lab drawn that had been ordered during his previous clinic appt with Dr. Orvis Brill back in January. He has some difficulty getting to a local lab (we reviewed a number of locations in [place]), so would like to have this addressed when he comes up to [facility] for next appt. Advised pt that would be fine, requested that he come up to [facility] early enough to have this drawn before he comes to clinic to meet with his neurologist. Pt in agreement with this plan. He was pleasant and appreciative.

## 2021-11-04 NOTE — Telephone Encounter (Signed)
Lvm and rcb for scheduling with Dr Garnette Czech to [facility] from Dr Orvis Brill

## 2021-11-09 DIAGNOSIS — G1229 Other motor neuron disease: Secondary | ICD-10-CM | POA: Insufficient documentation

## 2021-11-29 ENCOUNTER — Encounter (HOSPITAL_BASED_OUTPATIENT_CLINIC_OR_DEPARTMENT_OTHER): Payer: Self-pay

## 2021-11-29 NOTE — Progress Notes (Signed)
Received paper form from [employer] asking provider's signature for the patient's transportation to his upcoming appointment with Dr. Laqueta Jean on 12/23/2021. Form signed by Dr Threasa Beards and form faxed back to fax number [phone] and [phone]. Fax went through, confirmation received. Sent to media to be scanned. Thank you.

## 2021-12-03 ENCOUNTER — Encounter (HOSPITAL_BASED_OUTPATIENT_CLINIC_OR_DEPARTMENT_OTHER): Payer: Self-pay

## 2021-12-23 ENCOUNTER — Ambulatory Visit (HOSPITAL_BASED_OUTPATIENT_CLINIC_OR_DEPARTMENT_OTHER): Payer: No Typology Code available for payment source | Admitting: Urology

## 2021-12-27 ENCOUNTER — Other Ambulatory Visit (HOSPITAL_BASED_OUTPATIENT_CLINIC_OR_DEPARTMENT_OTHER): Payer: Self-pay

## 2021-12-27 ENCOUNTER — Other Ambulatory Visit (HOSPITAL_BASED_OUTPATIENT_CLINIC_OR_DEPARTMENT_OTHER): Payer: Self-pay | Admitting: Unknown Physician Specialty

## 2021-12-27 ENCOUNTER — Ambulatory Visit: Payer: No Typology Code available for payment source | Attending: Neurology | Admitting: Unknown Physician Specialty

## 2021-12-27 VITALS — BP 130/95 | HR 82 | Wt 157.0 lb

## 2021-12-27 DIAGNOSIS — M7918 Myalgia, other site: Secondary | ICD-10-CM | POA: Insufficient documentation

## 2021-12-27 DIAGNOSIS — G729 Myopathy, unspecified: Secondary | ICD-10-CM | POA: Insufficient documentation

## 2021-12-27 LAB — LAB UNDEFINED ORCA/EPIC ORDER

## 2021-12-27 MED ORDER — PREGABALIN 75 MG OR CAPS
150.0000 mg | ORAL_CAPSULE | Freq: Two times a day (BID) | ORAL | 5 refills | Status: DC
Start: 2021-12-27 — End: 2022-01-21

## 2021-12-27 NOTE — Progress Notes (Addendum)
[place]  Follow-up visit    CC/ID: 46 year old right-handed male with history of dilated cardiomyopathy who comes in for follow-up of myopathy.    Interval History:  He was last seen in January 2023. Since then, he finds his weakness is getting worse.  He has increasing difficulty reaching above his head.  He has increasing difficulty raising from a chair and requires the help of his arms.   He has been using a walking cane for about a year.  He had one fall this year by slipping in the shower.   He has tried to get a raised toilet seat but has had an issue with the prescription.   He still takes the Pregabalin with partial relief. He has a lot of shoulder girdle pain.   He also reports dysphagia symptoms, he will be seen in speech therapy next year.     Disease history:  Briefly, he reports onset of severe muscle aching and cramping since September 2021. Soon after, he began to have proximal leg weakness, and had proximal arm involvement beginning around October 2021. This has been progressive and he had also started to have dysphagia for which he was admitted in January 2022. 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 MRI of neuraxis and extensive lab testing including LP has been unrevealing. After muscle biopsy was completed, he was tried on the following therapies: corticosteroids (IV solumedrol x3 days then high dose prednisone 12m which was tapered down over several months to 130mdaily), IVIG (07/2020).  He saw PT initially but stopped 07/2020 as he could not tolerate muscle pain during sessions. In regards to his dilated cardiomyopathy, he was diagnosed in 2018 when he was admitted for pneumonia, and during work-up they discovered that he  had reduced ejection fraction. He reported that this had improved, as his ECHO in 06/22/2019 showed EF 35-40%, but a TTE 10/25/2020 showed EF 51%. He has no known FH of muscle or neuromuscular disease, but he has very strong family history of cardiac disease. He does have personal history of polysubstance abuse including ETOH.       Medical Hx: No past medical history on file.    Surgical Hx: No past surgical history on file.    Social Hx:   Social History     Socioeconomic History    Marital status: Significant other     Spouse name: Not on file    Number of children: Not on file    Years of education: Not on file    Highest education level: Not on file   Occupational History    Not on file   Tobacco Use    Smoking status: Not on file    Smokeless tobacco: Not on file   Substance and Sexual Activity    Alcohol use: Not on file    Drug use: Not on file    Sexual activity: Not on file   Other Topics Concern    Not on file   Social History Narrative    Not on file     Social Determinants of Health     Financial Resource Strain: Not on file   Food Insecurity: Not on file   Transportation Needs: Not on file   Physical Activity: Not on file  Stress: Not on file   Social Connections: Not on file   Intimate Partner Violence: Not on file   Housing Stability: Not on file     Lives with Sheral Flow    Allergies:   Review of patient's allergies indicates:  Allergies   Allergen Reactions    Lisinopril Resp: Cough       Current medication:   Current Outpatient Medications   Medication Sig Dispense Refill    acetaminophen 325 MG tablet Take 2 tablets (650 mg) by mouth every 6 hours as needed.      Docusate Sodium (DSS) 100 MG capsule Take 2 capsules by mouth 2 times a day.      ibuprofen 600 MG tablet Take 1 tablet (600 mg) by mouth.      losartan 25 MG tablet Take 0.5 tablets (12.5 mg) by mouth daily.      oxyCODONE 10 MG tablet Take 1 tablet (10 mg) by mouth every 6 hours.      polyethylene glycol 3350 17 GM/SCOOP oral powder  Take 17 g by mouth.      pregabalin 75 MG capsule Take 1 capsule (75 mg) by mouth 2 times a day. 60 capsule 5     No current facility-administered medications for this visit.           PHYSICAL EXAM:     Vitals:    12/27/21 1626   Pulse: 82   BP: (!) 130/95   SpO2: 99%   Weight: 71.2 kg (157 lb)            Neurological Examination:  Mental status:  Awake, alert, and oriented to reason for clinic visit.  Follows commands. Fluent speech.     Some orbicularis oculi and oris weakness with inability to keep air in cheeks. No dysarthria. Tongue protrusion midline, full strength to each side, minimal slowing moving side to side    Motor exam:   Bilateral scapular winging, slightly worse on R; triple hump appearance and bulging abdomen suggestive of abdominal weakness.   Unable to get up from sitting in chair without the use of arms     Neck flexion 5/5  Neck extension 4/5      Delt  Bi  Tri  WE  WF  EDC   IO APB   Left 4 5- 4+ '5 5 5 5 5   ' Right 4 5- '5 5 5 5 5 5       ' HF  KE  KF  DF  PF  EHL   Inv  Ev   Left 3+ '5 5 5 5 5 5 5   ' Right 3+ '5 5 5 5 5 5 5       ' Sensory: intact to light touch throughout     Reflexes: 2+ in bilateral biceps, brachioradialis, triceps, patellae, ankles. Toes downgoing.    Gait: Walks with cane. Mild Trendelenberg gait, wide base      Past work-up:  Labs   Myositis panel negative  HMGCR neg  CSF neg  ACHR neg  MUSK neg  T. Pallidum neg  A1c 5.9  LFTs wnl  CK 444 (06/09/2020), 357 (06/12/2020)  Hep B/C panels neg  HIV neg  TSH wnl   CSF testing (WBC 1, RBC 8, protein 33, glucose 75, gram neg, oligoclonal bands neg)  ANA neg  CRP 0.6  ESR 20 --> 15  SSA/SSB neg  UA wnl, no myoglobinuria noted      EDX: 06/28/2020 (Dr. Derryl Harbor)  EMG FINDINGS: Nerve conduction study demonstrated normal motor response in right median, ulnar, peroneal, and tibial nerves. Sensory nerve conduction study demonstrated normal sensory response in the right radial, peroneal, and sural nerves. EMG concentric needle examination demonstrates  myopathic changes in proximal muscles. No acute denervation is seen in all the muscles examined.    INTERPRETATION: Abnormal study. 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 There is no evidence to suggests peripheral polyneuropathy in either upper or lower extremities. There is no evidence to suggests motor neuron disease.         MRI R femur 06/14/2020  IMPRESSION:   1. Heterogeneous muscle edema and enhancement in the right adductor muscle   group, quadriceps muscles, tensor fascia lata, and hamstring muscles.     2. 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 Please correlate clinically.        Muscle Biopsy (full report under Pathology) 06/15/2020  NEUROMUSCULAR PROCESS WITH NECROTIZING/REGENERATING AND NEUROGENIC FEATURES  This right quadriceps muscle biopsy is involved by a process characterized by variably prominent and clustered myonecrosis and myoregeneration as well as features of neurogenic atrophy (small angular esterase rich fibers and nuclear bags). Features such as hypertrophy/hypotrophy, focally increased centralized myonuclei, focal endomysial fibrosis and split/whorled fibers are suggestive of chronicity. There is no significant endomysial/perimysial lymphocytic inflammation, T-lymphocytic infiltration of non-necrotic myofibers or diffuse and robust HLA-I upregulation. Despite necrosis/regeneration macrophage infiltration of the endomysium is modest. There is no increased alkaline phosphatase staining, perifascicular necrosis, perifascicular atrophy, rimmed vacuoles or pTDP43/TDP43 immunoreactive aggregates. The clinical concern of myositis is noted. Absence of some of the aforementioned features argue against a firm diagnosis of entities such as polymyositis/overlap myositis, sporadic  inclusion body myositis (sIBM), immune myopathies with prominent perimysial pathology (some of which may arise in the setting of  antisynthetase syndromes) and some dermatomyopathies. Features of active vasculitis or granulomatous disease are not noted. Acid phosphatase does not show diagnostic evidence of a myopathy with autophagic vacuoles (including acid maltase deficiency; confirmed on PAS). A limited panel of immunohistochemical stains to examine for the possibility of a dystrophinopathy or some of the more common forms of limb-girdle muscular dystrophy (LGMD) or myofibrillar myopathies did  not show diagnostic alterations.    ADDENDUM   ELECTRON MICROSCOPY:  Micrographs from ultrastructural studies show the following features:  Myofiber architecture-    The sarcolemmal membranes and the basal lamina are largely well preserved in viable fibers.  In viable myofibers the myofibrils show generally well-organized sarcomeric architecture with parallel alternating thick (myosin) and thin (actin) filaments and well-formed z-bands. Multifocal myofibrillary interruption is noted often containing amorphous material admixed with Z-band material/rods (see below).         Mitochondria-   Mitochondria show mild to moderate size variation (focal swelling/hydropic changes). Mitochondria are present in nearly normal numbers and show normal stacking of cristae.  Mitochondrial paracrystalline "parking lot" inclusions and dense round inclusions are not present.        Sarcoplasmic aggregates-   Definite rimmed vacuoles are not identified.   Multifocal rod material is present in viable and degenerated fibers (see above). Definite cylindrical spirals and tubular aggregates are not identified.  Other inclusions/aggregates such as fingerprint, filamentous, curvilinear, or cytoplasmic bodies are not identified.        Sarcoplasmic storage:  Lipid and lipofuscin content appears  within normal limits.  Glycogen content appears within  normal limits without definite examples of membrane bound glycogen.  The sarcotubular system is inconspicuous.     ADDENDUM COMMENT:  This case is awaiting external consultation. The presence of myofibrillar disorganization and rods raises consideration of rod myopathies and some myofibrillar/dystrophic process. Clinical correlation is recommended.       Swallow Eval 06/15/2020   No oropharyngeal dysphagia concern noted. Suspect esophageal dysfunction due to patient reported discomfort, globus sensation with liquid intake, and report of reflux of liquids earlier today.      XR Esophogram/Barium Swallow 10/24/2020  IMPRESSION:  Flash laryngeal penetration without aspiration is observed.     Otherwise unremarkable double contrast esophagram as described above.       10/25/2020 TTE  The left ventricular chamber size is normal. The left ventricular systolic function is borderline reduced.  The calculated ejection fraction is 51% by 3D. Global longitudinal strain (GLS) is -18.4%. There is no  evidence of left ventricular hypertrophy. Spectral Doppler is indicative of a impaired relaxation (stage 1)  filling pattern. The left atrium is mildly dilated.        IMPRESSION:  Paul Lloyd. is a 46 year old right-handed male with history of dilated cardiomyopathy who comes in for follow-up of myopathy.  His muscle biopsy shows relatively unspecific findings of myofibrillar disorganization and rods, which raised the consideration of some myofibrillar/dystrophic process.   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  Unfortunately there seems to have been an issue with the genetic request getting to the patient so the test was never done despite the request being approved by insurance. We reprinted the lab request today so the patient can do the sampling. We are referring  to genetic for counseling if FSHD were confirmed, or opinion on further testing if negative.   We will also increase his Lyrica to 150 BID to try and better control his pain.   We will put in an order for DME for a toilet lift and try and coordinate that with the nursing team.    We offered some home PT but Paul Lloyd does not feel ready for it at the moment - he is welcome to contact us whenever he would like for [place] to put a req in.     PLAN:  1. Increase Lyrica to 150 mg BID for pain   2. FSHD1 testing   3. Genetic referral   4. He was offered home PT and OT but he deferred.  5. He will follow-up in 4 months    Patient seen, examined and discussed with neuromuscular attending, Dr. Mariel Kansky.      Rise Paganini, MD  Neuromuscular Fellow  Department of Neurology

## 2021-12-29 NOTE — Progress Notes (Signed)
I saw and evaluated this patient and I have reviewed the fellow's note.  I agree with the findings and plan as documented.  Exceptions/additions: none.  Lavaya Defreitas David Aleea Hendry, MD

## 2021-12-29 NOTE — Patient Instructions (Addendum)
We will increase the Lyrica to 150 mg twice a day for pain   2.   We will get the approved FSHD1 testing today  3.   We will refer you to Genetics  4.   Let us know if you want to have any home PT or OT.  5.   Follow-up in 4 months

## 2022-01-02 NOTE — Progress Notes (Signed)
DME orders for toilet lift faxed to [facility] in [place].

## 2022-01-21 ENCOUNTER — Encounter (HOSPITAL_BASED_OUTPATIENT_CLINIC_OR_DEPARTMENT_OTHER): Payer: Self-pay | Admitting: Unknown Physician Specialty

## 2022-01-21 ENCOUNTER — Other Ambulatory Visit (HOSPITAL_BASED_OUTPATIENT_CLINIC_OR_DEPARTMENT_OTHER): Payer: Self-pay | Admitting: Neurology

## 2022-01-21 ENCOUNTER — Telehealth (HOSPITAL_BASED_OUTPATIENT_CLINIC_OR_DEPARTMENT_OTHER): Payer: Self-pay | Admitting: Neurology

## 2022-01-21 DIAGNOSIS — M7918 Myalgia, other site: Secondary | ICD-10-CM

## 2022-01-21 MED ORDER — PREGABALIN 150 MG OR CAPS
150.0000 mg | ORAL_CAPSULE | Freq: Two times a day (BID) | ORAL | 5 refills | Status: DC
Start: 2022-01-21 — End: 2022-06-18

## 2022-01-21 NOTE — Telephone Encounter (Signed)
RETURN CALL: Voicemail - Detailed Message      SUBJECT:  General Message     MESSAGE: Prior Authorization  Lyrita 75 mg needed for [facility] on [address] [place]  [phone]

## 2022-01-22 NOTE — Telephone Encounter (Signed)
Submitted via CMM

## 2022-02-09 ENCOUNTER — Encounter (HOSPITAL_BASED_OUTPATIENT_CLINIC_OR_DEPARTMENT_OTHER): Payer: Self-pay | Admitting: Unknown Physician Specialty

## 2022-02-10 NOTE — Telephone Encounter (Signed)
Hx: 46 yo male with a history of dilated cardiomyopathy with progressive weakness, HTN, obstructive sleep apnea, chronic systolic heart failure, acute kidney injury, and acute respiratory failure    Last OV: 12/27/21 with Dr. Henry Russel    Next OV: 05/09/22 with Dr. Henry Russel    Situation: Patient is wondering when his genetic test results will arrive. He also reported losing more mobility.     RN called the [facility] lab who gave the number for Client Support Services [phone] who said they will look into the status of this lab and will have someone call the clinic with an answer today or tomorrow. Clinic number for call back provided.      Plan: MyChart message sent to patient.  Routing update regarding mobility to Dr. Henry Russel

## 2022-02-11 NOTE — Telephone Encounter (Signed)
[facility] Client Support Sv[facility] contacted outside performing lab - [place] [facility] [place].  They will be sending FSHD lab results over.  Expect results to upload to EPIC by Wednesday, 09/20 or Thursday, 09/21.

## 2022-02-20 LAB — GENETICS SENDOUT TEST

## 2022-04-11 ENCOUNTER — Encounter (HOSPITAL_BASED_OUTPATIENT_CLINIC_OR_DEPARTMENT_OTHER): Payer: No Typology Code available for payment source | Admitting: Unknown Physician Specialty

## 2022-05-09 ENCOUNTER — Ambulatory Visit: Payer: No Typology Code available for payment source | Attending: Neurology | Admitting: Unknown Physician Specialty

## 2022-05-09 VITALS — BP 159/98 | HR 106 | Ht 72.01 in | Wt 157.0 lb

## 2022-05-09 DIAGNOSIS — G7100 Muscular dystrophy, unspecified: Secondary | ICD-10-CM | POA: Insufficient documentation

## 2022-05-09 DIAGNOSIS — R269 Unspecified abnormalities of gait and mobility: Secondary | ICD-10-CM | POA: Insufficient documentation

## 2022-05-09 DIAGNOSIS — G729 Myopathy, unspecified: Secondary | ICD-10-CM | POA: Insufficient documentation

## 2022-05-09 NOTE — Progress Notes (Addendum)
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 At that point we requested FSHD testing with the hope that if positive he could be eligible to a clinical trial, but unfortunately that was also negative.  He continues to have some progression of his weakness.   He remains ambulatory with a cane and only reports falling when not using it.   He is being followed in speech therapy in [place] for his dysphagia symptoms and they made some recommendations on how to adapt his swallowing and gave him incentive spirometry.  He was previously found to be eligible for a cough assist but does not have one yet. He does report a weak cough and difficulty clearing secretions.   He continues to take oxycodone and Lyrica for his pain.     Disease history:  Briefly, he reports onset of severe muscle aching and cramping since September 2021. Soon after, he began to have proximal leg weakness, and had proximal arm involvement beginning around October 2021. This has been progressive and he had also started to have dysphagia for which he was admitted in January 2022. 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 MRI of neuraxis and extensive lab testing including LP has been unrevealing. After muscle biopsy was completed, he was tried on the following therapies: corticosteroids (IV solumedrol x3 days then high dose prednisone 80m which was tapered down over several months to 194mdaily), IVIG (07/2020).  He saw PT initially but stopped 07/2020 as he could not tolerate muscle pain during sessions. 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 He reported that this had improved, as his ECHO in 06/22/2019 showed EF 35-40%, but a TTE 10/25/2020 showed EF 51%. He has no known FH of muscle or neuromuscular disease, but he has very strong family history of cardiac disease. He does have personal history of polysubstance abuse including ETOH.        Neurological Examination:    Some orbicularis oculi and oris weakness with inability to keep air in cheeks.   Motor exam:   Bilateral scapular winging, slightly worse on R; triple hump appearance and bulging abdomen suggestive of abdominal weakness.   Unable to get up from sitting in chair without the use of arms     Neck flexion 5/5  Neck extension 4/5      Delt  Bi  Tri  WE  WF  EDC   IO APB   Left 4 5- 4+ _0 Right 4 5- _1 HF  KE  KF  DF  PF  EHL   Inv  Ev   Left _2 Right _3 Sensory: intact to light touch throughout     Reflexes: 2+ in bilateral biceps, brachioradialis, triceps, patellae, ankles. Toes downgoing.    Gait: Walks with cane. Mild Trendelenberg gait, wide base  Past work-up:  Labs   Myositis panel negative  HMGCR neg  CSF neg  ACHR neg  MUSK neg  T. Pallidum neg  A1c 5.9  LFTs wnl  CK 444 (06/09/2020), 357 (06/12/2020)  Hep B/C panels neg  HIV neg  TSH wnl   CSF testing (WBC 1, RBC 8, protein 33, glucose 75, gram neg, oligoclonal bands neg)  ANA neg  CRP 0.6  ESR 20 --> 15  SSA/SSB neg  UA wnl, no myoglobinuria noted      EDX: 06/28/2020 (Dr. Derryl Harbor)  EMG FINDINGS: Nerve conduction study demonstrated normal motor response in right median, ulnar, peroneal, and tibial nerves. Sensory nerve conduction study demonstrated normal sensory response in the right radial, peroneal, and sural nerves. EMG concentric needle examination demonstrates myopathic changes in proximal muscles. No acute denervation is seen in all the muscles  examined.    INTERPRETATION: Abnormal study. 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 There is no evidence to suggests peripheral polyneuropathy in either upper or lower extremities. There is no evidence to suggests motor neuron disease.         MRI R femur 06/14/2020  IMPRESSION:   1. Heterogeneous muscle edema and enhancement in the right adductor muscle   group, quadriceps muscles, tensor fascia lata, and hamstring muscles.     2. 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 Please correlate clinically.        Muscle Biopsy (full report under Pathology) 06/15/2020  NEUROMUSCULAR PROCESS WITH NECROTIZING/REGENERATING AND NEUROGENIC FEATURES  This right quadriceps muscle biopsy is involved by a process characterized by variably prominent and clustered myonecrosis and myoregeneration as well as features of neurogenic atrophy (small angular esterase rich fibers and nuclear bags). Features such as hypertrophy/hypotrophy, focally increased centralized myonuclei, focal endomysial fibrosis and split/whorled fibers are suggestive of chronicity. There is no significant endomysial/perimysial lymphocytic inflammation, T-lymphocytic infiltration of non-necrotic myofibers or diffuse and robust HLA-I upregulation. Despite necrosis/regeneration macrophage infiltration of the endomysium is modest. There is no increased alkaline phosphatase staining, perifascicular necrosis, perifascicular atrophy, rimmed vacuoles or pTDP43/TDP43 immunoreactive aggregates. The clinical concern of myositis is noted. Absence of some of the aforementioned features argue against a firm diagnosis of entities such as polymyositis/overlap myositis, sporadic inclusion body myositis (sIBM), immune myopathies with prominent perimysial pathology (some  of which may arise in the setting of  antisynthetase syndromes) and some dermatomyopathies. Features of active vasculitis or granulomatous disease are not noted. Acid phosphatase does not show diagnostic evidence of a myopathy with autophagic vacuoles (including acid maltase deficiency; confirmed on PAS). A limited panel of immunohistochemical stains to examine for the possibility of a dystrophinopathy or some of the more common forms of limb-girdle muscular dystrophy (LGMD) or myofibrillar myopathies did  not show diagnostic alterations.    ADDENDUM   ELECTRON MICROSCOPY:  Micrographs from ultrastructural studies show the following features:  Myofiber architecture-    The sarcolemmal membranes and the basal lamina are largely well preserved in viable fibers.  In viable myofibers the myofibrils show generally well-organized sarcomeric architecture with parallel alternating thick (myosin) and thin (actin) filaments and well-formed z-bands. Multifocal myofibrillary interruption is noted often containing amorphous material admixed with Z-band material/rods (see below).         Mitochondria-   Mitochondria show mild to moderate size variation (focal swelling/hydropic changes).  Mitochondria are present in nearly normal numbers and show normal stacking of cristae.  Mitochondrial paracrystalline "parking lot" inclusions and dense round inclusions are not present.        Sarcoplasmic aggregates-   Definite rimmed vacuoles are not identified.   Multifocal rod material is present in viable and degenerated fibers (see above). Definite cylindrical spirals and tubular aggregates are not identified.  Other inclusions/aggregates such as fingerprint, filamentous, curvilinear, or cytoplasmic bodies are not identified.        Sarcoplasmic storage:  Lipid and lipofuscin content appears within normal limits.  Glycogen content appears within normal limits without definite examples of membrane bound glycogen.  The sarcotubular system is  inconspicuous.     ADDENDUM COMMENT:  This case is awaiting external consultation. The presence of myofibrillar disorganization and rods raises consideration of rod myopathies and some myofibrillar/dystrophic process. Clinical correlation is recommended.       Swallow Eval 06/15/2020   No oropharyngeal dysphagia concern noted. Suspect esophageal dysfunction due to patient reported discomfort, globus sensation with liquid intake, and report of reflux of liquids earlier today.      XR Esophogram/Barium Swallow 10/24/2020  IMPRESSION:  Flash laryngeal penetration without aspiration is observed.     Otherwise unremarkable double contrast esophagram as described above.       10/25/2020 TTE  The left ventricular chamber size is normal. The left ventricular systolic function is borderline reduced.  The calculated ejection fraction is 51% by 3D. Global longitudinal strain (GLS) is -18.4%. There is no  evidence of left ventricular hypertrophy. Spectral Doppler is indicative of a impaired relaxation (stage 1)  filling pattern. The left atrium is mildly dilated.        IMPRESSION:  Elza Seymore Brodowski. is a 46 year old right-handed male with history of dilated cardiomyopathy who comes in for follow-up of degenrative myopathy. 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 In 50-60% of cases we do not find a genetic mutations. We discussed that we would get him home PT/OT for home adaptations, reach out to respiratory therapy for a cough assist and try and get him a transport wheelchair. We also recommended empiric use of CoQ10 and creatine.     PLAN:  1.Home PT and OT for home adaptations  2. DME for transport wheelchair  3. Prescription of a cough assist  4. Creatine 5g to 10 g daily, with extra water intake to avoid constipation  5. Coenzyme Q10 300 to 600 mg daily  6. Follow-up in 6  months if he is still in the region     Patient seen, examined and discussed with neuromuscular attending, Dr. Mariel Kansky.      Rise Paganini, MD  Neuromuscular Fellow  Department of Neurology

## 2022-05-09 NOTE — Patient Instructions (Addendum)
Although we have not found a specific mutation to explain your condition, the appearance of your muscle biopsy is suggestive of a degenerative muscle disease. Some findings on your biopsy are consistent with what we called a Myofibrillar Myopathy.  This is a type of muscular dystrophy that can present later in life and affect muscles of the neck, arms, legs, but also swallowing, breathing and heart. In 50 % of persons with this condition, we do not find a genetic mutation.   We do not have more investigations to suggest at this point.   Unfortunately, there is no treatment for this condition.     You could consider taking supplements that are good for muscle health, although they would not reverse your muscle disease. Those supplements are available over the counter or online:    - Creatine 5g to 10 g daily, with extra water intake to avoid constipation  - Coenzyme Q10 300 to 600 mg daily    We will follow-up with the respiratory therapist to get you a cough assist device    We will also make a referral for home services to assess how they can provide equipment for your home    Follow-up in 6 months if you are still in the region

## 2022-05-09 NOTE — Progress Notes (Signed)
I saw and evaluated this patient and I have reviewed the fellow's note.  I agree with the findings and plan as documented.  Exceptions/additions: none.  Darnell Jeschke David Daija Routson, MD

## 2022-05-12 NOTE — Progress Notes (Signed)
Hehas excessive pulmonary secretions and is incapable of mobilizing his own secretions due to an ineffective cough. This is evidenced by a peak cough flow of 220 L/s. He will require a cough assist machine to mobilize his pulmonary secretions.

## 2022-05-13 ENCOUNTER — Telehealth (HOSPITAL_BASED_OUTPATIENT_CLINIC_OR_DEPARTMENT_OTHER): Payer: Self-pay

## 2022-05-13 ENCOUNTER — Other Ambulatory Visit (HOSPITAL_BASED_OUTPATIENT_CLINIC_OR_DEPARTMENT_OTHER): Payer: Self-pay | Admitting: Neurology

## 2022-05-13 ENCOUNTER — Telehealth (HOSPITAL_BASED_OUTPATIENT_CLINIC_OR_DEPARTMENT_OTHER): Payer: Self-pay | Admitting: Neurology

## 2022-05-13 DIAGNOSIS — G7100 Muscular dystrophy, unspecified: Secondary | ICD-10-CM

## 2022-05-13 NOTE — Telephone Encounter (Signed)
Fax orders for Cough Assist faxed to [facility].

## 2022-05-13 NOTE — Telephone Encounter (Signed)
I called patient and left a voicemail letting him know that order for transport wheelchair will be sent to [facility] in [place].  Phone number for [employer] provided.    DME order from 05/09/22 faxed to [facility] in [place]:  Phone   [phone]  Fax        [phone]      ===View-only below this line===  ----- Message -----  From: Mariel Craft, MD  Sent: 05/09/2022   6:00 PM PST  To: Randel Pigg, RN  Subject: Transport chair

## 2022-05-14 ENCOUNTER — Telehealth (HOSPITAL_BASED_OUTPATIENT_CLINIC_OR_DEPARTMENT_OTHER): Payer: Self-pay | Admitting: Clinical

## 2022-05-14 NOTE — Telephone Encounter (Addendum)
MSW received referral from Dr. Janann August with request to arrange home health PT, OT for Paul Lloyd.     MSW spoke with Paul Lloyd by phone to discuss home health referral and provider options. Paul Lloyd lives in [place] and has [employer]. He does not have a home health provider preference.     MSW initiated referral to [facility] for home health PT, OT. Will confirm home health referral receipt/acceptance and SOC.     ADDENDUM - 05/16/22  MSW received call from [place] with [facility] informing that the agency does not accept [facility]. Referral was initiated to [facility].     ADDENDUM - 05/16/22  [facility] stated inability to accept referral due to capacity issues. [facility] is at capacity for [employer]/Medicaid. Referral was initiated to [facility].     ADDENDUM - 05/20/22  [facility] declined home health referral stating inability to accept payer. MSW will contact additional home health agencies in [place] in effort to locate an accepting agency.     ADDENDUM - 05/22/22  [facility] is at capacity for [employer]. [facility] is out-of-network with patient's insurance. [facility] is not contracted with patient's insurance. [facility] can accept patient's insurance and agreed to consider referral. Referral was faxed to [facility], the only remaining home health agency in [place].     ADDENDUM - 05/23/22  [facility] declined home health referral due to staffing issues. All home health options have been exhausted and services cannot be arranged at this time. MSW informed Bron and Dr. Janann August.

## 2022-05-26 ENCOUNTER — Encounter (HOSPITAL_BASED_OUTPATIENT_CLINIC_OR_DEPARTMENT_OTHER): Payer: Self-pay | Admitting: Unknown Physician Specialty

## 2022-06-06 ENCOUNTER — Other Ambulatory Visit (HOSPITAL_BASED_OUTPATIENT_CLINIC_OR_DEPARTMENT_OTHER): Payer: Self-pay | Admitting: Unknown Physician Specialty

## 2022-06-06 ENCOUNTER — Telehealth (HOSPITAL_BASED_OUTPATIENT_CLINIC_OR_DEPARTMENT_OTHER): Payer: Self-pay

## 2022-06-06 DIAGNOSIS — G7100 Muscular dystrophy, unspecified: Secondary | ICD-10-CM

## 2022-06-06 DIAGNOSIS — R1310 Dysphagia, unspecified: Secondary | ICD-10-CM

## 2022-06-06 MED ORDER — GUAIFENESIN 100 MG/5ML OR LIQD
400.0000 mg | Freq: Four times a day (QID) | ORAL | 6 refills | Status: AC | PRN
Start: 2022-06-06 — End: 2022-09-04

## 2022-06-06 NOTE — Telephone Encounter (Signed)
Orders for suction device added to home care equipment needed, for airway management.  Fax sent to [facility].

## 2022-06-12 DIAGNOSIS — E782 Mixed hyperlipidemia: Secondary | ICD-10-CM | POA: Diagnosis present

## 2022-06-18 ENCOUNTER — Other Ambulatory Visit (HOSPITAL_BASED_OUTPATIENT_CLINIC_OR_DEPARTMENT_OTHER): Payer: Self-pay | Admitting: Neurology

## 2022-06-18 DIAGNOSIS — M7918 Myalgia, other site: Secondary | ICD-10-CM

## 2022-06-18 MED ORDER — PREGABALIN 150 MG OR CAPS
150.0000 mg | ORAL_CAPSULE | Freq: Two times a day (BID) | ORAL | 5 refills | Status: AC
Start: 2022-06-18 — End: ?

## 2022-08-26 ENCOUNTER — Encounter (HOSPITAL_BASED_OUTPATIENT_CLINIC_OR_DEPARTMENT_OTHER): Payer: Self-pay | Admitting: Unknown Physician Specialty

## 2022-08-27 NOTE — Telephone Encounter (Signed)
Left voice message with patient requesting a return call to clinic at [phone] to discuss reported worsening of symptoms. Informed patient that MyChart message would also be sent and that he could respond I whatever way worked best for him.

## 2022-09-02 NOTE — Telephone Encounter (Signed)
Spoke to patient who is somewhat difficult to understand in speech. His mobility is worse. He can barely walk. He can not walk to do PT or arrange transportation and would need home physical therapy as he is home bound. He is having trouble coughing and unable to use the cough assist. He is having trouble sneezing. He is choking on water and most liquids. Let patient know that Dr. Janann August is asking [employer] to see if they can do an assessment at home for W/C. Patient has an appointment on 5/3.

## 2022-09-03 ENCOUNTER — Other Ambulatory Visit (HOSPITAL_BASED_OUTPATIENT_CLINIC_OR_DEPARTMENT_OTHER): Payer: Self-pay | Admitting: Unknown Physician Specialty

## 2022-09-03 ENCOUNTER — Telehealth (HOSPITAL_BASED_OUTPATIENT_CLINIC_OR_DEPARTMENT_OTHER): Payer: Self-pay | Admitting: Neurology

## 2022-09-03 DIAGNOSIS — G729 Myopathy, unspecified: Secondary | ICD-10-CM

## 2022-09-03 NOTE — Telephone Encounter (Addendum)
I called patient back again.  Patient willing to schedule as many outpatient therapies as possible on 5/3 in order to consolidate appointments.  (This is assuming that his [facility] social worker is not able to find any other home health options.  Patient/social worker to call clinic with any other options.)    Patient stated that he is able to come to [facility] for PFTs at 10am on 5/3 in order to allow time for as many outpatient therapies as possible that afternoon.  I called PFT lab and left voicemail to please reschedule 2pm PFTs to 10am so that patient can have outpatient therapies in the afternoon.      Patient is aware that [facility] will be calling him to schedule outpatient therapies.

## 2022-09-03 NOTE — Telephone Encounter (Signed)
I called Pulmonary Function Testing lab, per provider request, and had patient scheduled for PFTs on same day as next appointment in Neurology Clinic (5/3).     I called patient and let him know the above and that [facility] will be calling him to schedule PT, OT, SLP.      We discussed that [facility] social worker has exhausted all known options for coordinating home health therapies, per documentation in EPIC.  Patient stated that his social worker will be coming out to his home this afternoon.  He plans to ask social worker if they know of any other options for home health in their area.    ----- Message -----  From: Mel Almond, MD  Sent: 09/03/2022  10:24 AM PDT  To: Mariel Craft, MD; *    Hi,    This patient has a follow-up planned with [place] in neurology clinic on May 3rd.   His mobility is very restricted so it is difficult for him to come here. We tried getting him home PT and OT but his insurance denied it. I have put new referrals for PT, OT, ST and pulmonary function testing here.  We were hoping to try and coordinate some of those evaluations on the same day as his neurology visit, with priority to PFTs and PT for PWC fitting.   Do you think you could assist in coordinating?    Thank you,    Benedicte

## 2022-09-03 NOTE — Telephone Encounter (Signed)
I called patient to follow-up and LVMTCB.

## 2022-09-04 ENCOUNTER — Encounter (HOSPITAL_BASED_OUTPATIENT_CLINIC_OR_DEPARTMENT_OTHER): Payer: Self-pay

## 2022-09-08 ENCOUNTER — Encounter (HOSPITAL_BASED_OUTPATIENT_CLINIC_OR_DEPARTMENT_OTHER): Payer: Self-pay | Admitting: Unknown Physician Specialty

## 2022-09-08 NOTE — Telephone Encounter (Signed)
I called patient and left voicemail letting him know that we have rescheduled his PFTs from 2pm to 10am in order for [facility] to fit in all his other therapies on 5/3.  In addition to PFTs at 10am, it looks like he has been scheduled for the following by Outpatient Therapies:  9am - SLP  1:30pm - OT  3pm - PT  4:30 - Neurology Clinic

## 2022-09-10 NOTE — Telephone Encounter (Signed)
I called patient and left another voicemail letting him know that PFT appointment was changed from 2pm to 10am in order to fit in the other therapy appointments.

## 2022-09-15 ENCOUNTER — Encounter (HOSPITAL_BASED_OUTPATIENT_CLINIC_OR_DEPARTMENT_OTHER): Payer: Self-pay | Admitting: Unknown Physician Specialty

## 2022-09-15 DIAGNOSIS — M7918 Myalgia, other site: Secondary | ICD-10-CM

## 2022-09-15 DIAGNOSIS — G7100 Muscular dystrophy, unspecified: Secondary | ICD-10-CM

## 2022-09-19 MED ORDER — OXYCODONE HCL 15 MG OR TABS
15.0000 mg | ORAL_TABLET | Freq: Four times a day (QID) | ORAL | 0 refills | Status: AC | PRN
Start: 2022-11-17 — End: 2022-12-17

## 2022-09-19 MED ORDER — OXYCODONE HCL 15 MG OR TABS
15.0000 mg | ORAL_TABLET | Freq: Four times a day (QID) | ORAL | 0 refills | Status: AC | PRN
Start: 2022-10-17 — End: 2022-11-16

## 2022-09-19 MED ORDER — OXYCODONE HCL 15 MG OR TABS
15.0000 mg | ORAL_TABLET | Freq: Four times a day (QID) | ORAL | 0 refills | Status: AC | PRN
Start: 2022-12-17 — End: 2023-01-16

## 2022-09-19 NOTE — Telephone Encounter (Signed)
TC to [facility]'s [facility]. Spoke with Morrell Riddle to confirm pt has been receiving oxycodone 5mg  tablets with the instructions to take 15mg  q 6 hours as needed for pain. He has been receiving #120 tablets. Morrell Riddle indicates pt received his last refill April 24th, so next refill can take place May 24th. They have no additional prescriptions on file for pt's oxycodone.    TC to pt to confirm he is establishing with new PCP October 22nd (Dr. Denman George at [facility]. Explained to pt that Dr. Janann August is willing to assist with the oxycodone prescriptions only until he sees the new PCP. Pt reports understanding and was appreciative. Let him know we can only write controlled substance prescriptions for 3 sequential months, so he will need to reach out to [place] in July to request scripts for August, September, October. Pt reports understanding and is in agreement with this plan.     We discussed the itinerary for his upcoming appointments May 3rd. Pt will plan accordingly as he has a full day of appts here at [facility]. He was pleasant and appreciative.

## 2022-09-25 NOTE — Progress Notes (Signed)
OCCUPATIONAL THERAPY INITIAL EVALUATION    This note serves as a CMS evaluation form that requires the referring provider to certify the need for therapy services furnished under this Plan of Treatment. The signing provider is certifying the plan of care.    GENERAL VISIT INFO    Encounter Diagnoses   Name Primary?    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     No past medical history on file.    No past surgical history on file.    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 If possible to try and coordinate with neurology visit on 5/3 as the patient's mobility is very restricted.     Per note of Paul Dansereau MD dated 05/10/23, " Paul Lloyd. is a 47 year old right-handed male with history of dilated cardiomyopathy who comes in for follow-up of degenrative myopathy. 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 In 50-60% of cases we do not find a genetic mutations. " See medical chart for detailed history.     Home Environment: Lives with fiance Paul Lloyd in [place]. Lives on level 2 of apartment, no elevator. Is having a lot of difficulty with the stairs. Tub shower combo.     Equipment Owned: SPC, shower chair, RTS no rails, life alert, manual wheelchair-too big    Occupational Profile: Mostly homebound in  apartment. Unable to do much due to weakness and fatigue.   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  Used to enjoy fishing and cooking but hasn't been since onset.   Now reads online about his disease, watches TV.    SUBJECTIVE:  Patient Statement:   Has fallen about 1/2 dozen times.   Essentially homebound because he cannot do the stairs and getting out is very difficult.  Does endorse shoulder pain and leg pain. Gets worse with movement, coughing, sneezing.   Soft spoken, does endorse speech and swallow changes.    OBJECTIVE MEASURES/ IMPAIRMENTS:    Upper Extremity Range of Motion:   B shoulder flexion AROM: 30 degrees active in sitting  Elbows: WFL    Upper Extremity Strength: shoulder flexion 2-/5  Bicep/tricep: WFL  Grip and wrist: WFL    Tone: stiffness    Hand Dominance: Right    Grip & Pinch:  R grip strength: 96, 82, 81 (norm: 76-129#)  L grip strength: 71 ,91, 87 (norm 69-120#)    Coordination: impaired only due to weakness in shoulders    Basic Activities of Daily Living  Gets extremely fatigued so is getting help with most BADLS.   Has to be careful with his eating because his swallow has changed.    Instrumental Activities of Daily Living  Paul Lloyd is doing all IADLs.   Paul Lloyd can manage his phone communication and they share financial responsibility but most tasks are very fatiguing.     Cognition/Visual Perceptual  Denies concerns and is not part of clinical presentation.     TREATMENT & EDUCATION:  Interventions/Treatment provided:   OT Evaluation (Complex) Time Entry: 40   Therapeutic Activities: 15 min    Educated patient on shoulder ROM stretch to improve shoulder position in supine. Pt needing min assist to achieve position due to weakness but tolerated the position well. Educated on frequency and duration of stretch hold.  Discussed process for initiating wheelchair order and will plan to collaborate with PT and vendor for next steps considering patient's current housing  accessibility limitations.     Links sent for [employer] and for [employer]     Link sent for tub transfer bench which may be easier for his tub transfers than what he is currently using.               Education:    Primary learner: Patient and fiance Paul Lloyd  Preferred learning style:  demonstration  Barriers to learning: vision   Cultural practices that influence care: none   Topic taught: as above   Response to education: receptive, overwhelmed      ASSESSMENT:  Pt presents with the following:  Comorbidities and Personal Factors affecting occupational performance: Lives in 2nd story apartment without elevator. Limited local social support. Son with disability also lives with him. Support system lives in [place] but is getting better medical care here.  Level of modification of task or assistance needed: Significant  Clinical Decision Making Complexity:High  Pt with probable muscular dystrophy versus myopathy of other unknown etiology. His symptoms are characterized by progressive weakness which is limiting his mobility and indep with BADLs. His weakness is most significant proximally and he cannot raise his arms or hold up his head against gravity. Currently ambulating with a cane but is having frequent falls. Would benefit from [facility] but his current living environment is not accessible, though may change in the near future. Will continue to work with team on supporting this patient and his complex needs. Skilled OT to address equipment needs as appropriate to maximize safe functional indep with ADLs and MRADLs.    Rehabilitation potential is: Fair  Potential Barriers to achieve rehab goals: progressive disease, limited support and access to resources  These goals were discussed and the patient agrees with them.         DISCHARGE GOALS  to be met by Anticipated Discharge Date:  12/25/22    Functional Discharge Goals:  1. Pt will be trained for shoulder maintenance and stretching to maintain ROM  and reduce pain. -MET but will review  2. Pt will be evaluated for medical equipment needs including mobility equipment for MRADL indep and safety, as appropriate, in 12 weeks.      PLAN OF CARE:   This patient will be seen for 3-4 therapy sessions in 12 weeks       The therapy will include: Self care/ADL Management, Therapeutic Activities, Therapeutic Exercise, Neuromuscular Re-education, Manual Therapy Technique, Orthotic Management/Training, Cognitive Skills Development, DME & AE Recommendations    Intervention for next treatment: address medical equipment needs as appropriate (future visit not scheduled, asked patient to schedule as needed)      [name], MS, OT/L  [facility]   Occupational Therapy     Referring Physician/Practitioner Certification  Statement:   I certify the need for these services furnished under this plan of treatment and while under my care.        Signature:______________________________________ Date:______________

## 2022-09-26 ENCOUNTER — Ambulatory Visit (HOSPITAL_BASED_OUTPATIENT_CLINIC_OR_DEPARTMENT_OTHER): Payer: Medicare Other | Admitting: Unknown Physician Specialty

## 2022-09-26 ENCOUNTER — Ambulatory Visit
Admission: RE | Admit: 2022-09-26 | Discharge: 2022-09-26 | Disposition: A | Discharge status: Home / Self Care | Payer: Medicare Other | Attending: Pulmonary Disease | Admitting: Pulmonary Disease

## 2022-09-26 ENCOUNTER — Encounter (HOSPITAL_BASED_OUTPATIENT_CLINIC_OR_DEPARTMENT_OTHER): Payer: No Typology Code available for payment source | Admitting: Rehabilitative and Restorative Service Providers"

## 2022-09-26 ENCOUNTER — Encounter (HOSPITAL_BASED_OUTPATIENT_CLINIC_OR_DEPARTMENT_OTHER): Payer: Self-pay

## 2022-09-26 ENCOUNTER — Ambulatory Visit (HOSPITAL_BASED_OUTPATIENT_CLINIC_OR_DEPARTMENT_OTHER): Payer: Medicare Other | Admitting: Rehabilitative and Restorative Service Providers"

## 2022-09-26 ENCOUNTER — Ambulatory Visit (HOSPITAL_BASED_OUTPATIENT_CLINIC_OR_DEPARTMENT_OTHER): Payer: Medicare Other | Admitting: Speech-Language Pathologist

## 2022-09-26 ENCOUNTER — Ambulatory Visit: Payer: Medicare Other | Attending: Neurology | Admitting: Rehabilitative and Restorative Service Providers"

## 2022-09-26 VITALS — BP 135/87 | HR 122 | Temp 98.2°F | Ht 72.01 in

## 2022-09-26 DIAGNOSIS — Z789 Other specified health status: Secondary | ICD-10-CM | POA: Insufficient documentation

## 2022-09-26 DIAGNOSIS — Z7409 Other reduced mobility: Secondary | ICD-10-CM | POA: Insufficient documentation

## 2022-09-26 DIAGNOSIS — M6281 Muscle weakness (generalized): Secondary | ICD-10-CM | POA: Insufficient documentation

## 2022-09-26 DIAGNOSIS — R471 Dysarthria and anarthria: Secondary | ICD-10-CM

## 2022-09-26 DIAGNOSIS — R942 Abnormal results of pulmonary function studies: Secondary | ICD-10-CM | POA: Insufficient documentation

## 2022-09-26 DIAGNOSIS — G729 Myopathy, unspecified: Secondary | ICD-10-CM

## 2022-09-26 LAB — PULMONARY FUNCTION TESTING
EXPTIME-Pre: 5.65 s
FEF25-75-%Pred-Pre: 40 %
FEF25-75-LLN: 1.98 L/sec
FEF25-75-Pre: 1.48 L/sec
FEF25-75-Pred: 3.65 L/sec
FEV1-%Pred-Pre: 42 %
FEV1-LLN: 3.05 L
FEV1-Pre: 1.72 L
FEV1-Pred: 4.03 L
FEV1/FVC-%Pred-Pre: 97 %
FEV1/FVC-LLN: 68 %
FEV1/FVC-Pre: 78 %
FEV1/FVC-Pred: 79 %
FVC-%Pred-Pre: 43 %
FVC-LLN: 3.87 L
FVC-Pre: 2.22 L
FVC-Pred: 5.08 L
MEP-%Pred-Pre: 15 %
MEP-Pre: 36 cmH2O
MEP-Pred: 233 cmH2O
MIP-%Pred-Pre: 45 %
MIP-Pre: -56 cmH2O
MIP-Pred: -123 cmH2O
PEF-Pre: 253.7 L/min

## 2022-09-26 NOTE — Progress Notes (Signed)
Physical Therapy Evaluation and Initial Plan of Care  This note serves as a CMS evaluation form that requires the referring provider to certify the need for therapy services furnished under this Plan of Treatment. The signing provider is certifying the plan of care.    The following patient identifiers were confirmed today: name and date of birth.    GENERAL VISIT INFO  Referring Provider: Mariel Craft, MD    NPI: [id number]  Treatment/Therapy diagnosis:   Encounter Diagnoses   Name Primary?    Myopathy     Decreased functional mobility and endurance Yes       VISIT COUNT  Visits from Start of Episode: 1    General Assessment  Certification From*: 09/26/22  Certification To*: 12/25/22  Start of Care Date*: 09/26/22  Reason for Referral*: G72.9 (ICD-10-CM) - 359.9 (ICD-9-CM) - Myopathy  Pain Score: 7 (shoulders & legs/thighs)  Best Level of Pain: 7  Worst Level of Pain: 10 - Worst possible pain  Aggravating Factors: movements  Relieving Factors: lying down  Have you fallen in the past year?: Yes  How many times?: More than 3  Were you injured?: No  Do you worry about falling?: Yes  Do you feel unsteady when standing or walking?: Yes  Fall Screen Date: 09/26/22  Interpreter Status: Not Needed  The patient has a  high risk for falls and this note will be forwarded to the referring physician.    PERTINENT MEDICAL/SURGICAL HISTORY:   No past medical history on file.     No past surgical history on file.       Previous interventions: none for wc    Prior Level of Function: Before the onset of the current condition, the patient was able to walk inside and outside of his apartment without assistance     Home Environment: lives with his wife in a 2nd-floor apartment     Equipment Owned: SPC, shower chair, manual wc     INITIAL EVALUATION SUBJECTIVE:   Patient Statement:  Pt was lying on a mat in PT gym post OT session.  He's been at [facility] since 9AM this morning.  He had a speech, OT, pulmonary appt.  He has another  one left after PT session.   Medical transit picked him up, and transferred to hospital provided wc, pushed upto this clinic.    Spend most day inside the apartment, watching TV.  He was dx w Myopathy about 2 years ago, but at the end of last year, he noticed his LEs got very weak.  He is unable to walk safely.     Wife reports they are planning to move to [place] where they have support "I have a lot of family there, also our grandkids there too"  She thinks it could be within the next 3 months, because they need help.     Barriers to Learning: fatigue, weakness     Cultural Practices that Influence Care: none     INITIAL EVALUATION OBJECTIVE MEASURES/IMPAIRMENTS:      Range of Motion:  all ROM limited by weakness in UEs and LEs     LE Strength:    L LE (_/5) R LE (_/5)   Hip Flexion 1 1   Hip Ext     Hip Abduction 1 1   Hip Adduction 1 1   Knee Extension  1   Knee Flexion 1 1   Ankle Dorsiflexion 5 5   Ankle Plantarflexion 3* 3*   Ankle  Inversion 5 5   Ankle Eversion 5 5   *tested in supine   UE Strength:    L UE (_/5) R UE (_/5)   Shoulder Abduction 1 1   Shoulder Flexion 1 1   Shoulder Extension 2 2   Elbow Flexion 4 4   Elbow Extension 3 3   Wrist Extension     Wrist Flexion     Grip Strength 4 4     Motor Control/Muscle Tone: hypotonic     Sensation: decreased distally to light touch, (B)LE     Skin History/Issues:       Method of pressure relief:     Edema: present   Location: (B)LE, distally   took 20sec for capillary refill   Pitting level:     SITTING POSTURE  In Current wheelchair:    Posture - Pt sits in a mwc, [place], with bunch of blankets under, head tilted to his (L) and forwarded. UEs adducted and extended kept in between his knees.     Sitting at edge of mat: Pt is unable to maintain his trunk upright without UE support.  He leans back holding onto edge of the mat, head fully flexed, chin on his chest.     Pelvic Tilt: posterior   Pelvic Obliquity:  (L4, (R)3 finger width between lowest rib & top of  iliac crest                             L ASIS is superior to R  ASIS  Pelvic Rotation: Not observed  Leg/Foot Position: abducted   Lumbar Lordosis: decreased   Thoracic Kyphosis: flattened   Scoliosis: not observed   Shoulder/Scapula Position: abducted (R)>> (L)   Head Position: forwarded and to his (R)     Supine on Mat: pt assists his LEs using his UEs to lift his LEs over the edge of mat, he needed assistance to complete this task.     Pelvic Tilt: neutral   Pelv c Obliquity:  (L)4, (R)3  finger width between lowest rib & top of iliac crest                             (L) ASIS is superior to (R)ASIS  Pelvic Rotation: not obseved   Leg/Foot Position:  externally rotated   Lumbar Lordosis: flattened   Thoracic Kyphosis: flattened  Scoliosis:   Shoulder/Scapula Position: elevated  Head Position: extended     Anatomical Measurements:  (no height taken for this visit)             (no weight taken for this visit)  Base of seat to elbow:   Seat pan to inferior angle of scapula: 18"  Hip width: 15"  Upper leg length: 20.5"  Lower leg length:  with shoes: 20"        Seat pan to shoulder: 24"  Seat pan to top of head:   Width shoulder to shoulder: 17.5"    Mobility Considerations:   Ambulation: He is not safe to walk at all   AD: 1OX  Assistance required: mod A  Distance: several steps   Quality: head hung over forwards, flexed posture, knees in flexion, BOS sig widened.   Functional and safe ambulation: NO    Manual wheelchair : pt is unable to self propel due to truncal muscle and cervical muscle weakness as well as UEs  weakness      Transfer Technique: Gowers's sign - though the mat was raised to 20" high, he wasn't able to stand using LEs only.     Current Cushion/Seating: N/A     Current Wheelchair: N/A      Home living situation:  Most common surfaces (narrowest doorway):   How will wheelchair be transported: public transportation   Does user drive? No                    ADLs: he needs assistance from his wife    Other equipment: as above   Social responsibilities: family and friends    Cognitive: issues that may interfere with wheelchair propulsion/driving/seating: none    Visual/Perceptual deficits: none     Repair/replacement history of existing wheelchair: NA    Equipment considered : Group 3 power wheelchair     EVAL ASSESSMENT/RECCOMMENDATIONS/RATIONALE: Patient is a 47 year old male with myopathy presenting at OP PT for evaluation.  Pt presents today with frequent falls, severe weakness in lower & upper extremity, severely decreased trunk & cervical spinal control, decreased sensation to light touch, and decreased activity tolerance.   These impairments limit his functional mobilities.  Pt would benefit from 1:1 skilled PT to achieve the goals stated below.     Rehabilitation potential is: Fair - good   Potential Barriers to achieve rehab goals: very limited support and his apartment being on 2nd floor without elevator   Facilitators to achieve rehab goals:    Preferred learning style: verbal    TREATMENT & EDUCATION:       PT Evaluation Time Entry  PT Evaluation (Moderate) Time Entry: 30     PT Therapeutic Procedures Time Entry  Therapeutic Activity Time Entry: 30        Time Totals  Total Timed Minutes: 30  Total Untimed Minutes: 30  Total Minutes Combined: 60    Interventions/Treatment provided:  Pt education on process of obtaining a power wheelchair  Pt tried a Proofreader - this corrected his posture dramatically so he could look straight ahead.    Justification of Medical Necessity for Headmaster - Pt is unable to keep his head up because he has weak neck muscles. With the use of the headmaster, he was able to keep his cervical spine in neutral position.  He could meet this writer's eyes thanks to the device.  Moreover it enabled him to breathe better.       ASSESSMENT OF TREATMENT PROVIDED TODAY: Patient verbalized his understanding of today education and demonstrated proper technique performing the exercises  given to him today. Follow up needed.       DISCHARGE GOALS  to be met by:  12/25/22   Patient Goals: to get an assistive device to move around safely     Functional Discharge      Goals Short Term Goals Impairments Current Level of Function /   Progress Toward Goal   1.Pt to have an appropriate PWC   2.Pt to be Ind with PWC A. Pt to try a pwc  B. Pt to pick details of his chair   See above assessment (09/26/2022) Goals set  Vendor contacted      These goals were discussed and the patient agrees with them.    PLAN OF CARE:  This patient will be seen for 3 therapy sessions  over 90 days.  The therapy will include therapeutic activities and wc management     Intervention for next  treatment: have him try a chair, cushions and backrests    "Silver" Baltazar Najjar, PT, DPT  Board Certified Specialist in Geriatric Physical Therapy  [facility]  Rehab Department [phone]  [address] [place], [place] [address]  [facility]   [phone]   [phone] (wheelchair seating only)      Referring Physician/Practitioner Certification Statement:   I certify the need for these services furnished under this plan of treatment and while under my care.      Signature:__________________________________Date:___________________

## 2022-09-26 NOTE — Progress Notes (Addendum)
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 At that point we referred him for home PT and OT but unfortunately that was denied by his insurance.   Today he comes back for coordinated appointments with ST, PT, OT and for PFTs.     He continues to have some progression of his weakness. His head drop is becoming more significant and today he was fitted for a head master collar.   He has frequent falls at home and was also fitted today for a PWC.      He continues to report difficulty clearing secretions despite the cough assist.  Today his PFTs showed FVC of 43% which is significantly worse than last year at 77%. He would now qualify for a BiPAP and we will make sure this gets organized.     He continues to take oxycodone and Lyrica for his pain.     Disease history:  Briefly, he reports onset of severe muscle aching and cramping since September 2021. Soon after, he began to have proximal leg weakness, and had proximal arm involvement beginning around October 2021. This has been progressive and he had also started to have dysphagia for which he was admitted in January 2022. 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 MRI of neuraxis and extensive lab testing including LP has been unrevealing. After muscle biopsy was completed, he was tried on the following therapies: corticosteroids (IV solumedrol x3 days then high dose prednisone 60mg  which was tapered down over several months to 10mg  daily), IVIG (07/2020).  He saw PT initially but stopped 07/2020 as he could not tolerate muscle pain during sessions. 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 He reported that this had improved, as his ECHO in 06/22/2019 showed EF 35-40%, but a TTE 10/25/2020 showed EF 51%. He has no known FH of muscle or neuromuscular disease, but he has very strong family history of cardiac disease. He does have personal history of polysubstance abuse including ETOH.        Neurological Examination:    Some orbicularis oculi and oris weakness with inability to keep air in cheeks.     Motor exam:   Bilateral scapular winging, slightly worse on R; triple hump appearance and bulging abdomen suggestive of abdominal weakness.     Neck flexion 5/5  Neck extension 4-/5    Patient tired at end of day and worried to miss transportation. Please refer to PT note for full strength testing  He has significant proximal weakness in deltoids and hip flexors  Strength preserved distally    Sensory: intact to light touch, pinprick and joint position sense      Past work-up:  Labs   Myositis panel negative  HMGCR neg  CSF neg  ACHR neg  MUSK neg  T. Pallidum neg  A1c 5.9  LFTs wnl  CK 444 (06/09/2020), 357 (06/12/2020)  Hep B/C panels neg  HIV neg  TSH wnl   CSF testing (WBC 1, RBC 8, protein 33, glucose 75, gram neg, oligoclonal bands neg)  ANA  neg  CRP 0.6  ESR 20 --> 15  SSA/SSB neg  UA wnl, no myoglobinuria noted    EDX: 06/28/2020 (Dr. Josefa Half)  EMG FINDINGS: Nerve conduction study demonstrated normal motor response in right median, ulnar, peroneal, and tibial nerves. Sensory nerve conduction study demonstrated normal sensory response in the right radial, peroneal, and sural nerves. EMG concentric needle examination demonstrates myopathic changes in proximal muscles. No acute denervation is seen in all the muscles examined.    INTERPRETATION: Abnormal study. 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 There is  no evidence to suggests peripheral polyneuropathy in either upper or lower extremities. There is no evidence to suggests motor neuron disease.      MRI R femur 06/14/2020  IMPRESSION:   1. Heterogeneous muscle edema and enhancement in the right adductor muscle   group, quadriceps muscles, tensor fascia lata, and hamstring muscles.     2. 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 Please correlate clinically.        Muscle Biopsy (full report under Pathology) 06/15/2020  NEUROMUSCULAR PROCESS WITH NECROTIZING/REGENERATING AND NEUROGENIC FEATURES  This right quadriceps muscle biopsy is involved by a process characterized by variably prominent and clustered myonecrosis and myoregeneration as well as features of neurogenic atrophy (small angular esterase rich fibers and nuclear bags). Features such as hypertrophy/hypotrophy, focally increased centralized myonuclei, focal endomysial fibrosis and split/whorled fibers are suggestive of chronicity. There is no significant endomysial/perimysial lymphocytic inflammation, T-lymphocytic infiltration of non-necrotic myofibers or diffuse and robust HLA-I upregulation. Despite necrosis/regeneration macrophage infiltration of the endomysium is modest. There is no increased alkaline phosphatase staining, perifascicular necrosis, perifascicular atrophy, rimmed vacuoles or pTDP43/TDP43 immunoreactive aggregates. The clinical concern of myositis is noted. Absence of some of the aforementioned features argue against a firm diagnosis of entities such as polymyositis/overlap myositis, sporadic inclusion body myositis (sIBM), immune myopathies with prominent perimysial pathology (some of which may arise in the setting of  antisynthetase syndromes) and some dermatomyopathies. Features of active vasculitis or granulomatous disease are not noted. Acid  phosphatase does not show diagnostic evidence of a myopathy with autophagic vacuoles (including acid maltase deficiency; confirmed on PAS). A limited panel of immunohistochemical stains to examine for the possibility of a dystrophinopathy or some of the more common forms of limb-girdle muscular dystrophy (LGMD) or myofibrillar myopathies did  not show diagnostic alterations.    ADDENDUM   ELECTRON MICROSCOPY:  Micrographs from ultrastructural studies show the following features:  Myofiber architecture-    The sarcolemmal membranes and the basal lamina are largely well preserved in viable fibers.  In viable myofibers the myofibrils show generally well-organized sarcomeric architecture with parallel alternating thick (myosin) and thin (actin) filaments and well-formed z-bands. Multifocal myofibrillary interruption is noted often containing amorphous material admixed with Z-band material/rods (see below).         Mitochondria-   Mitochondria show mild to moderate size variation (focal swelling/hydropic changes). Mitochondria are present in nearly normal numbers and show normal stacking of cristae.  Mitochondrial paracrystalline "parking lot" inclusions and dense round inclusions are not present.        Sarcoplasmic aggregates-   Definite rimmed vacuoles are not identified.   Multifocal rod material is present in viable and degenerated fibers (see above). Definite cylindrical spirals and tubular aggregates are not identified.  Other inclusions/aggregates such as fingerprint,  filamentous, curvilinear, or cytoplasmic bodies are not identified.        Sarcoplasmic storage:  Lipid and lipofuscin content appears within normal limits.  Glycogen content appears within normal limits without definite examples of membrane bound glycogen.  The sarcotubular system is inconspicuous.     ADDENDUM COMMENT:  This case is awaiting external consultation. The presence of myofibrillar disorganization and rods raises consideration of rod  myopathies and some myofibrillar/dystrophic process. Clinical correlation is recommended.       Swallow Eval 06/15/2020   No oropharyngeal dysphagia concern noted. Suspect esophageal dysfunction due to patient reported discomfort, globus sensation with liquid intake, and report of reflux of liquids earlier today.      XR Esophogram/Barium Swallow 10/24/2020  IMPRESSION:  Flash laryngeal penetration without aspiration is observed.     Otherwise unremarkable double contrast esophagram as described above.       10/25/2020 TTE  The left ventricular chamber size is normal. The left ventricular systolic function is borderline reduced.  The calculated ejection fraction is 51% by 3D. Global longitudinal strain (GLS) is -18.4%. There is no  evidence of left ventricular hypertrophy. Spectral Doppler is indicative of a impaired relaxation (stage 1)  filling pattern. The left atrium is mildly dilated.      IMPRESSION:  Nichola Blakely Romack. 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  His onset of symptoms does seem somewhat abrupt and evolution relatively fast, although his muscle biopsy did not show significant inflammation, myositis Ab were negative and steroids/IVIG trials did not alter the disease course. Repeating a myositis panel at an outside lab could be a consideration, although at this point an inflammatory myositis seems unlikely.    PLAN:  PWC (fitted today)   2.  Head master collar (fitted today)  3.  Bipap  4. Repeat swallow study (will do in [place])  5. Follow-up in 3 months in [facility]  6. He is planned to establish care with a new PCP in October: Dr Denman George. We would recommend Dr Flonnie Hailstone refers him to a pain clinic locally as transportation is too complicated for him to come to the [place] pain clinic but he continues to have significant pain issues.     He needs a power wheelchair due to the progression of his myopathy and declining  mobility within his home with falls and inability to use a manual wheelchair due to arm weakness. He also is left alone for periods of time.     Patient qualifies for assisted ventilation based on the restrictive pulmonary disease guidelines of FVC < 50% or MIP < 60 cwp.    Mel Almond, MD  Neuromuscular Fellow    With Dr Regino Schultze

## 2022-09-26 NOTE — Patient Instructions (Addendum)
Here is the plan for today:    Power wheelchair (fitted today)   2.  Head master collar (fitted today)  3.  Bipap  4. Repeat swallow study (to be done in [place])  5. Follow-up in 3 months in [facility] clinic  6. We would recommend your next PCP Dr Flonnie Hailstone to refer you to a pain clinic locally

## 2022-09-26 NOTE — Progress Notes (Signed)
SPEECH THERAPY INITIAL EVALUATION (Medicare)    This note serves as a CMS evaluation form that requires the referring provider to certify the need for therapy services furnished under this Plan of Treatment. The signing provider is certifying the plan of care.    GENERAL VISIT INFO    Encounter Diagnoses   Name Primary?    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 If possible, please try to coordinate with neurology visit date on 5/3 as patient's mobility is severely restricted.     Please evaluate and treat: Speech and language  Pain Score: 7  Body Part/Category: arms and legs  Have you fallen in the past year?: Yes (4 times- Pt is seeing PT later today)  Interpreter Status: Not Needed         No past medical history on file.    Per note of [name] MD dated 05/10/23, " Megan Adedamola Gasparyan. is a 47 year old right-handed male with history of dilated cardiomyopathy who comes in for follow-up of degenrative myopathy. 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 In 50-60% of cases we do not find a genetic mutations. " See medical chart for detailed history.     Previous Interventions: Pt had speech therapy in [place] for dysphagia. He was also seen for a clinical swallow evaluation here at [facility] on 09/25/21.     Social history : Trevante lives with his fiance [name]      Pt/family report of current cognitive/communication difficulties:      Govani's speech is worse when fatigued and stressed. Using the  Communication effectiveness survey,  Pt rated his effectiveness at communication as follows with a 1-4 scale where 1= not at all effective and 4= very effective:    Having a conversation with family member/friends at home = 2  Participating in a conversation with strangers in a quiet place =3- Cordelle gave this a higher score because he said he would say very little in this situation, such as a short greeting.   Conversing with a familiar person over the telephone, being part of a conversation in a noisy environment, speaking to a friend when emotionally upset/angry, having a conversation with someone at a distance (across a room) =1    [name] says that he talks much less than he used to and his volume is much lower, his voice more hoarse and he gets out of breath easily. Pt and [name] agree that his speech is worse when he is stressed or fatigued. [name] says it takes him about a minute or less of talking to get tired out and he then stops talking. She says that when he speeds up his speech he is hard to understand and she cues him to slow down. She often has to take over conversations Ikenna is having on the phone because of his speech difficulties.     Prior to onset of the current condition Pt was independent for communication  SUBJECTIVE:     Fiance [name] was present with Mohsen today. Edu attended in a wheelchair and often sat with his head down. He said that it is hard to keep his head up.       OBJECTIVE MEASURES/IMPAIRMENTS:     Motor speech:      Tongue - Adequate to reach the corners of his mouth but unable to reach top of upper lip.Tongue protruded midline, reduced bilateral strength, slowed movement.  Lip seal- weak (for holding a cheek puff)  Soft palate - difficulty visualizing- appeared to be raising symmetrically  Dentition WFL  Hoarse voicing    Maximum phonation time (MPT) /ah/: Pt's score: 5.43 seconds Norms: 15-25 seconds for adults.  Volume was adequate for this quiet treatment room with  careful listening sometimes required.     Diadochokinesis: slowed and regular. When Pt attempted to increase the pace, the pattern became irregular.and imprecise   Reading aloud, slowed rate and reduced breath support.     Pt was about 90% intelligible in connected speech in this quiet treatment room with careful listening sometimes required. It was rare that the clinician did not comprehend his message when he produced short anticipated phrases. However when he produced longer utterances with less familiar context  his intelligibility reduced and clinician had to ask him to repeat himself on several occasions. Speech was characterized by slowed rate, hoarse and strained voicing, high pitch, reduced respiratory support for speech, which was particularly apparent when Pt read a short passage aloud.     Clinician demonstrated two text to speech apps to Woodley [employer] certified [employer] and Talk for me) and Merlon used them to type a couple of messages accurately. He says that it is easier for him to type rather than talk. Clinician educated him that they could type information they wanted to communicate to their physicians, for example, using the app, prior to their meetings.    Clinician provided Erick with recommendations regarding clear speech strategies. Pt had no questions about the recommendations, which were provided to him in writing.     Encouraged Abdalrahman to reconnect with his swallow therapist in [place] to address his swallowing concerns.       ASSESSMENT:  Bradrick is a very pleasant 48 yo man with diagnosis of myopathy who presents with dysarthria characterized by reduced respiratory support for speech, hoarse voicing with higher pitch, reduced volume and articulatory imprecision. He and his fiance report that he is limiting his speech secondary to significant fatigue when talking. Keyontae rates his ability to communicate in many environments as not at all effective (1 on a 1-4 scale). During today's evaluation he  was ~90% intelligible in this quiet environment with careful listening sometimes required. Pt reported fatiguing during the session.  Effective strategies today were to slow down and use shorter phrases. Fuquan is judged appropriate for a course of skilled therapy addressing the functional goals below.    Barriers to learning: none    Preferred learning style:  seeing and hearing     Rehabilitation potential is: Good for goals      TREATMENT AND PLAN  Interventions/Treatment provided:PR EVALUATION OF SPEECH SOUND PRODUCTION ARTICULATE                      DISCHARGE GOALS:      Goal #1: Pt will use intelligibility strategies in connected speech with min to no cues such that this clinician does not have to ask him to repeat himself.  Time frame 12/25/22  Progress toward goal- established    Goal #2: Pt will demonstrate understanding of AAC and speech generating devices available  Time frame- 12/25/22  Progress toward goal - established                       Plan of Care:  Frequency: once ever ~ 2 weeks  Duration: over 90 days  Treatment visits: up to 6           Brennan Bailey, MS, CCC-SLP

## 2022-09-28 ENCOUNTER — Other Ambulatory Visit (HOSPITAL_BASED_OUTPATIENT_CLINIC_OR_DEPARTMENT_OTHER): Payer: Self-pay | Admitting: Neurology

## 2022-09-28 NOTE — Progress Notes (Addendum)
He requires a semi-electric hospital bed as this patient has been diagnosed with undifferentiated muscular dystrophy, a progressive neuromuscular disease.  This patient requires positioning of the body in ways not feasible with an ordinary bed and positioning that cannot be accomplished with pillows or a bed wedge.  Due to the progressive nature of this patient's disease, they are at high risk for aspiration and requires the head of the bed to be elevated more than 30 degrees due to difficulty breathing and possible aspiration.  In the event of aspiration or respiratory distress, this patient would need immediate change in body positioning.  In addition, this patient requires frequent changes in body positioning due to impaired mobility and overall strength in order to maintain skin integrity.  A semi-electric bed is needed in order for this patient's caregiver to assist with safe transfers as the patient continues to become progressively weaker due to their disease progression and unable to complete transfers without full assistance.

## 2022-09-30 ENCOUNTER — Telehealth (HOSPITAL_BASED_OUTPATIENT_CLINIC_OR_DEPARTMENT_OTHER): Payer: Self-pay | Admitting: Neurology

## 2022-09-30 ENCOUNTER — Telehealth (HOSPITAL_BASED_OUTPATIENT_CLINIC_OR_DEPARTMENT_OTHER): Payer: Self-pay

## 2022-09-30 ENCOUNTER — Other Ambulatory Visit (HOSPITAL_BASED_OUTPATIENT_CLINIC_OR_DEPARTMENT_OTHER): Payer: Self-pay | Admitting: Unknown Physician Specialty

## 2022-09-30 DIAGNOSIS — G7100 Muscular dystrophy, unspecified: Secondary | ICD-10-CM

## 2022-09-30 NOTE — Telephone Encounter (Signed)
-----   Message from Caryl Never, RRT sent at 09/30/2022  5:47 AM PDT -----  Regarding: Bipap Orders  Hello Benedicte -   Will get orders for bipap sent out today.    Sincerely,   Dorris Singh, RRT    @ RTs Orders in Other Orders to send for bipap  ----- Message -----  From: Mel Almond, MD  Sent: 09/26/2022   6:08 PM PDT  To: Caryl Never, RRT; #    Hello,    Laithan who you evaluated before had PFTs done today and looks like he now qualifies for BiPAP. I included the Bipap phrase in my note. Do you think you could assist with getting him set up with a BiPAP?   We will try to schedule him in [facility] clinic in the future which should make things easier.     Thank you for your help,    Benedicte

## 2022-09-30 NOTE — Telephone Encounter (Signed)
Orders have been send to [facility] for bipap.

## 2022-09-30 NOTE — Progress Notes (Signed)
ATTENDING ATTESTATION  I saw Art Patsi Sears. with Dr. Garnette Czech as the supervising physician on 09/26/2022.  I obtained a history and performed a directed physical examination. I have reviewed and discussed this patient's history, physical examination findings, outpatient workup, working diagnosis, care and disposition plans with Dr. Garnette Czech. Where necessary for either clarification or accuracy, I have added to the above note. I agree with the findings and plan as documented in the note.     Marlana Salvage. Regino Schultze, MD PhD  Attending Physician  Department of Neurology  [facility]

## 2022-09-30 NOTE — Telephone Encounter (Signed)
LVM for pt about scheduling a 3 mo fuv with Dr. Regino Schultze or Dr. Janann August.

## 2022-10-01 ENCOUNTER — Encounter (HOSPITAL_BASED_OUTPATIENT_CLINIC_OR_DEPARTMENT_OTHER): Payer: Self-pay | Admitting: Neurology

## 2022-10-01 ENCOUNTER — Telehealth (HOSPITAL_BASED_OUTPATIENT_CLINIC_OR_DEPARTMENT_OTHER): Payer: Self-pay | Admitting: Neurology

## 2022-10-01 DIAGNOSIS — G7100 Muscular dystrophy, unspecified: Secondary | ICD-10-CM

## 2022-10-01 NOTE — Telephone Encounter (Signed)
Encounter opened in error

## 2022-10-01 NOTE — Telephone Encounter (Addendum)
DME order faxed to [facility].    I called patient and left a voicemail letting him know that Dr. Janann August has ordered a hospital bed per OT recommendation.  This order has been sent to [facility] in [place].  Please expect call to coordinate from [facility].  Please call [phone] with any questions.

## 2022-10-01 NOTE — Telephone Encounter (Signed)
Pending DME order for hospital bed per provider request.

## 2022-10-02 ENCOUNTER — Telehealth (HOSPITAL_BASED_OUTPATIENT_CLINIC_OR_DEPARTMENT_OTHER): Payer: Self-pay

## 2022-10-02 NOTE — Telephone Encounter (Addendum)
Social Work Note  [facility]    10/01/2022    Referring provider/ Reason for referral:  SW was referred to [facility] for accessible housing with concern for safety/ recent falls by [name], OT. Per referral, the patient has functionally declined more and is really struggling. He is living in an apartment, 2nd floor without an elevator and really cannot and should not be doing stairs--and is falling. I'm wondering if you have any resources for more immediate access to accessible housing? I'm afraid of injury and hospitalization.     Identifying information:  Per chart, Paul Lloyd is a 47 year old man followed by Dr. Janann August in the [facility] for muscular dystrophy. He lives in [place], [place], and has dual Medicare Part A & B and [place] insurance coverage. Provider One number [phone].    Assessment/ Outcomes:  Chart review performed. Of note, [name] has experienced falls at home due to the progression of his myopathy and housing accessibility limitations. He is homebound in his apartment. He was fitted for a power wheelchair on 5/3 and a semi-electric home hospital bed was recently ordered by Dr. Janann August.     SW called Fredi [phone]) this afternoon to check-in and discuss home accessibility and care needs. SW left a voicemail encouraging [name] to call SW back at his convenience.     SW will follow-up with the patient for a supportive check-in and discussion of home accessibility, reasonable accommodations, and care support/ needs.     Plan:  -Call [name] to discuss accessible housing/ assess for care and resource needs.       Corinne Ports, MSW  Social Worker  Multi-Clinic Triage  [facility] [facility] [employer]

## 2022-10-04 ENCOUNTER — Encounter (HOSPITAL_BASED_OUTPATIENT_CLINIC_OR_DEPARTMENT_OTHER): Payer: Self-pay | Admitting: Unknown Physician Specialty

## 2022-10-06 ENCOUNTER — Encounter (HOSPITAL_BASED_OUTPATIENT_CLINIC_OR_DEPARTMENT_OTHER): Payer: Self-pay | Admitting: Rehabilitative and Restorative Service Providers"

## 2022-10-15 ENCOUNTER — Telehealth (HOSPITAL_BASED_OUTPATIENT_CLINIC_OR_DEPARTMENT_OTHER): Payer: Self-pay | Admitting: Neurology

## 2022-10-15 NOTE — Telephone Encounter (Signed)
In order for patient to qualify for a hospital bed certain documentation guidelines must be met. Copy will be placed in media tab and in MD folder.

## 2022-10-16 NOTE — Telephone Encounter (Signed)
Will fax note and re-send.

## 2022-10-16 NOTE — Telephone Encounter (Signed)
10/16/22    Covering SW called and LVM for Jonavin today to check in with him about his housing accessibility issues and to possibly offer suggestions or resources.    SW also sent him a [facility] and encouraged him to write back at his earliest convenience.    SW Plan    SW will wait to hear back from Kalen.    SW will continue to follow and remains available.    Odelia Gage, MSW  Social Worker  [facility]  [phone]

## 2022-10-25 ENCOUNTER — Other Ambulatory Visit: Payer: Self-pay

## 2022-10-25 ENCOUNTER — Emergency Department (HOSPITAL_COMMUNITY): Payer: Medicare HMO

## 2022-10-25 ENCOUNTER — Encounter (HOSPITAL_COMMUNITY): Payer: Self-pay

## 2022-10-25 ENCOUNTER — Inpatient Hospital Stay (HOSPITAL_COMMUNITY)
Admission: EM | Admit: 2022-10-25 | Discharge: 2022-10-31 | DRG: 194 | Disposition: A | Discharge status: Hospice | Payer: Medicare HMO | Attending: Family Medicine | Admitting: Family Medicine

## 2022-10-25 ENCOUNTER — Ambulatory Visit (HOSPITAL_COMMUNITY)
Admission: EM | Admit: 2022-10-25 | Discharge: 2022-10-25 | Disposition: A | Discharge status: Home / Self Care | Payer: Medicare HMO

## 2022-10-25 DIAGNOSIS — Z711 Person with feared health complaint in whom no diagnosis is made: Secondary | ICD-10-CM

## 2022-10-25 DIAGNOSIS — F1490 Cocaine use, unspecified, uncomplicated: Secondary | ICD-10-CM | POA: Diagnosis present

## 2022-10-25 DIAGNOSIS — J154 Pneumonia due to other streptococci: Secondary | ICD-10-CM | POA: Diagnosis not present

## 2022-10-25 DIAGNOSIS — I428 Other cardiomyopathies: Secondary | ICD-10-CM | POA: Diagnosis not present

## 2022-10-25 DIAGNOSIS — F17210 Nicotine dependence, cigarettes, uncomplicated: Secondary | ICD-10-CM | POA: Diagnosis present

## 2022-10-25 DIAGNOSIS — J181 Lobar pneumonia, unspecified organism: Secondary | ICD-10-CM | POA: Diagnosis not present

## 2022-10-25 DIAGNOSIS — I5022 Chronic systolic (congestive) heart failure: Secondary | ICD-10-CM | POA: Diagnosis present

## 2022-10-25 DIAGNOSIS — Z515 Encounter for palliative care: Secondary | ICD-10-CM

## 2022-10-25 DIAGNOSIS — Z888 Allergy status to other drugs, medicaments and biological substances status: Secondary | ICD-10-CM | POA: Diagnosis not present

## 2022-10-25 DIAGNOSIS — G4733 Obstructive sleep apnea (adult) (pediatric): Secondary | ICD-10-CM | POA: Diagnosis present

## 2022-10-25 DIAGNOSIS — Z1152 Encounter for screening for COVID-19: Secondary | ICD-10-CM

## 2022-10-25 DIAGNOSIS — Z79899 Other long term (current) drug therapy: Secondary | ICD-10-CM

## 2022-10-25 DIAGNOSIS — G709 Myoneural disorder, unspecified: Secondary | ICD-10-CM | POA: Diagnosis present

## 2022-10-25 DIAGNOSIS — Z66 Do not resuscitate: Secondary | ICD-10-CM | POA: Diagnosis present

## 2022-10-25 DIAGNOSIS — G8929 Other chronic pain: Secondary | ICD-10-CM | POA: Diagnosis present

## 2022-10-25 DIAGNOSIS — Z681 Body mass index (BMI) 19 or less, adult: Secondary | ICD-10-CM

## 2022-10-25 DIAGNOSIS — R1310 Dysphagia, unspecified: Secondary | ICD-10-CM | POA: Diagnosis present

## 2022-10-25 DIAGNOSIS — R0602 Shortness of breath: Secondary | ICD-10-CM

## 2022-10-25 DIAGNOSIS — R000 Tachycardia, unspecified: Secondary | ICD-10-CM | POA: Diagnosis not present

## 2022-10-25 DIAGNOSIS — Z8249 Family history of ischemic heart disease and other diseases of the circulatory system: Secondary | ICD-10-CM

## 2022-10-25 DIAGNOSIS — G7100 Muscular dystrophy, unspecified: Secondary | ICD-10-CM

## 2022-10-25 DIAGNOSIS — R051 Acute cough: Secondary | ICD-10-CM | POA: Diagnosis not present

## 2022-10-25 DIAGNOSIS — J99 Respiratory disorders in diseases classified elsewhere: Secondary | ICD-10-CM | POA: Diagnosis not present

## 2022-10-25 DIAGNOSIS — R64 Cachexia: Secondary | ICD-10-CM | POA: Diagnosis present

## 2022-10-25 DIAGNOSIS — F32A Depression, unspecified: Secondary | ICD-10-CM | POA: Diagnosis present

## 2022-10-25 DIAGNOSIS — I110 Hypertensive heart disease with heart failure: Secondary | ICD-10-CM | POA: Diagnosis present

## 2022-10-25 DIAGNOSIS — J189 Pneumonia, unspecified organism: Principal | ICD-10-CM

## 2022-10-25 DIAGNOSIS — I420 Dilated cardiomyopathy: Secondary | ICD-10-CM | POA: Diagnosis not present

## 2022-10-25 DIAGNOSIS — Z7189 Other specified counseling: Secondary | ICD-10-CM | POA: Diagnosis not present

## 2022-10-25 HISTORY — DX: Muscular dystrophy, unspecified: G71.00

## 2022-10-25 LAB — I-STAT CHEM 8, ED
BUN: 5 mg/dL — ABNORMAL LOW (ref 6–20)
Calcium, Ion: 1.14 mmol/L — ABNORMAL LOW (ref 1.15–1.40)
Chloride: 104 mmol/L (ref 98–111)
Creatinine, Ser: 0.5 mg/dL — ABNORMAL LOW (ref 0.61–1.24)
Glucose, Bld: 94 mg/dL (ref 70–99)
HCT: 40 % (ref 39.0–52.0)
Hemoglobin: 13.6 g/dL (ref 13.0–17.0)
Potassium: 3.9 mmol/L (ref 3.5–5.1)
Sodium: 143 mmol/L (ref 135–145)
TCO2: 28 mmol/L (ref 22–32)

## 2022-10-25 LAB — CBC WITH DIFFERENTIAL/PLATELET
Abs Immature Granulocytes: 0.04 10*3/uL (ref 0.00–0.07)
Basophils Absolute: 0 10*3/uL (ref 0.0–0.1)
Basophils Relative: 0 %
Eosinophils Absolute: 0.2 10*3/uL (ref 0.0–0.5)
Eosinophils Relative: 2 %
HCT: 39.5 % (ref 39.0–52.0)
Hemoglobin: 12.5 g/dL — ABNORMAL LOW (ref 13.0–17.0)
Immature Granulocytes: 0 %
Lymphocytes Relative: 29 %
Lymphs Abs: 3.6 10*3/uL (ref 0.7–4.0)
MCH: 29.8 pg (ref 26.0–34.0)
MCHC: 31.6 g/dL (ref 30.0–36.0)
MCV: 94 fL (ref 80.0–100.0)
Monocytes Absolute: 0.6 10*3/uL (ref 0.1–1.0)
Monocytes Relative: 5 %
Neutro Abs: 7.6 10*3/uL (ref 1.7–7.7)
Neutrophils Relative %: 64 %
Platelets: 292 10*3/uL (ref 150–400)
RBC: 4.2 MIL/uL — ABNORMAL LOW (ref 4.22–5.81)
RDW: 14.8 % (ref 11.5–15.5)
WBC: 12.1 10*3/uL — ABNORMAL HIGH (ref 4.0–10.5)
nRBC: 0 % (ref 0.0–0.2)

## 2022-10-25 LAB — I-STAT VENOUS BLOOD GAS, ED
Acid-Base Excess: 2 mmol/L (ref 0.0–2.0)
Bicarbonate: 27.4 mmol/L (ref 20.0–28.0)
Calcium, Ion: 1.16 mmol/L (ref 1.15–1.40)
HCT: 39 % (ref 39.0–52.0)
Hemoglobin: 13.3 g/dL (ref 13.0–17.0)
O2 Saturation: 43 %
Potassium: 3.8 mmol/L (ref 3.5–5.1)
Sodium: 142 mmol/L (ref 135–145)
TCO2: 29 mmol/L (ref 22–32)
pCO2, Ven: 45.1 mmHg (ref 44–60)
pH, Ven: 7.392 (ref 7.25–7.43)
pO2, Ven: 25 mmHg — CL (ref 32–45)

## 2022-10-25 LAB — RAPID URINE DRUG SCREEN, HOSP PERFORMED
Amphetamines: NOT DETECTED
Barbiturates: NOT DETECTED
Benzodiazepines: NOT DETECTED
Cocaine: NOT DETECTED
Opiates: POSITIVE — AB
Tetrahydrocannabinol: NOT DETECTED

## 2022-10-25 LAB — CBC
HCT: 35.2 % — ABNORMAL LOW (ref 39.0–52.0)
Hemoglobin: 11 g/dL — ABNORMAL LOW (ref 13.0–17.0)
MCH: 29.6 pg (ref 26.0–34.0)
MCHC: 31.3 g/dL (ref 30.0–36.0)
MCV: 94.6 fL (ref 80.0–100.0)
Platelets: 279 10*3/uL (ref 150–400)
RBC: 3.72 MIL/uL — ABNORMAL LOW (ref 4.22–5.81)
RDW: 14.8 % (ref 11.5–15.5)
WBC: 14.8 10*3/uL — ABNORMAL HIGH (ref 4.0–10.5)
nRBC: 0 % (ref 0.0–0.2)

## 2022-10-25 LAB — CREATININE, SERUM
Creatinine, Ser: 0.61 mg/dL (ref 0.61–1.24)
GFR, Estimated: >60 mL/min (ref >60)

## 2022-10-25 LAB — RESP PANEL BY RT-PCR (RSV, FLU A&B, COVID)  RVPGX2
Influenza A by PCR: NEGATIVE
Influenza B by PCR: NEGATIVE
Resp Syncytial Virus by PCR: NEGATIVE
SARS Coronavirus 2 by RT PCR: NEGATIVE

## 2022-10-25 LAB — MRSA NEXT GEN BY PCR, NASAL: MRSA by PCR Next Gen: NOT DETECTED

## 2022-10-25 LAB — COMPREHENSIVE METABOLIC PANEL
ALT: 12 U/L (ref 0–44)
AST: 12 U/L — ABNORMAL LOW (ref 15–41)
Albumin: 3.4 g/dL — ABNORMAL LOW (ref 3.5–5.0)
Alkaline Phosphatase: 81 U/L (ref 38–126)
Anion gap: 11 (ref 5–15)
BUN: 5 mg/dL — ABNORMAL LOW (ref 6–20)
CO2: 25 mmol/L (ref 22–32)
Calcium: 9.5 mg/dL (ref 8.9–10.3)
Chloride: 105 mmol/L (ref 98–111)
Creatinine, Ser: 0.56 mg/dL — ABNORMAL LOW (ref 0.61–1.24)
GFR, Estimated: >60 mL/min (ref >60)
Glucose, Bld: 99 mg/dL (ref 70–99)
Potassium: 3.9 mmol/L (ref 3.5–5.1)
Sodium: 141 mmol/L (ref 135–145)
Total Bilirubin: 0.7 mg/dL (ref 0.3–1.2)
Total Protein: 7.9 g/dL (ref 6.5–8.1)

## 2022-10-25 LAB — LACTIC ACID, PLASMA: Lactic Acid, Venous: 1.5 mmol/L (ref 0.5–1.9)

## 2022-10-25 LAB — STREP PNEUMONIAE URINARY ANTIGEN: Strep Pneumo Urinary Antigen: POSITIVE — AB

## 2022-10-25 LAB — BRAIN NATRIURETIC PEPTIDE: B Natriuretic Peptide: 269 pg/mL — ABNORMAL HIGH (ref 0.0–100.0)

## 2022-10-25 MED ORDER — IPRATROPIUM-ALBUTEROL 0.5-2.5 (3) MG/3ML IN SOLN
3.0000 mL | Freq: Three times a day (TID) | RESPIRATORY_TRACT | Status: DC | PRN
  Administered 2022-10-26 – 2022-10-31 (×10): 3 mL via RESPIRATORY_TRACT
  Filled 2022-10-25 (×9): qty 3

## 2022-10-25 MED ORDER — SODIUM CHLORIDE 0.9 % IV BOLUS
1000.0000 mL | Freq: Once | INTRAVENOUS | Status: AC
  Administered 2022-10-25: 1000 mL via INTRAVENOUS

## 2022-10-25 MED ORDER — SODIUM CHLORIDE 0.9 % IV SOLN: 2.0000 g | Freq: Once | INTRAVENOUS | Status: DC

## 2022-10-25 MED ORDER — VANCOMYCIN HCL 1250 MG/250ML IV SOLN: 1250.0000 mg | Freq: Two times a day (BID) | INTRAVENOUS | Status: DC

## 2022-10-25 MED ORDER — SODIUM CHLORIDE 0.9 % IV SOLN: 2.0000 g | INTRAVENOUS | Status: DC

## 2022-10-25 MED ORDER — CARVEDILOL 12.5 MG PO TABS
12.5000 mg | ORAL_TABLET | Freq: Two times a day (BID) | ORAL | Status: DC
  Filled 2022-10-25: qty 1

## 2022-10-25 MED ORDER — SODIUM CHLORIDE 0.9 % IV SOLN
3.0000 g | Freq: Once | INTRAVENOUS | Status: DC
  Administered 2022-10-25: 3 g via INTRAVENOUS
  Filled 2022-10-25: qty 8

## 2022-10-25 MED ORDER — ACETYLCYSTEINE 20 % IN SOLN
2.0000 mL | Freq: Three times a day (TID) | RESPIRATORY_TRACT | Status: DC
  Administered 2022-10-25 – 2022-10-27 (×3): 2 mL via RESPIRATORY_TRACT
  Filled 2022-10-25 (×5): qty 4

## 2022-10-25 MED ORDER — SODIUM CHLORIDE 0.9 % IV SOLN
2.0000 g | INTRAVENOUS | Status: DC
  Administered 2022-10-26 – 2022-10-27 (×2): 2 g via INTRAVENOUS
  Filled 2022-10-25 (×2): qty 20

## 2022-10-25 MED ORDER — LACTATED RINGERS IV SOLN: INTRAVENOUS | Status: DC

## 2022-10-25 MED ORDER — PREGABALIN 75 MG PO CAPS
150.0000 mg | ORAL_CAPSULE | Freq: Two times a day (BID) | ORAL | Status: DC
  Filled 2022-10-25: qty 2

## 2022-10-25 MED ORDER — SODIUM CHLORIDE 0.9 % IV SOLN
500.0000 mg | INTRAVENOUS | Status: DC
  Administered 2022-10-25 – 2022-10-26 (×2): 500 mg via INTRAVENOUS
  Filled 2022-10-25 (×2): qty 5

## 2022-10-25 MED ORDER — IPRATROPIUM-ALBUTEROL 0.5-2.5 (3) MG/3ML IN SOLN
3.0000 mL | Freq: Once | RESPIRATORY_TRACT | Status: AC
  Administered 2022-10-25: 3 mL via RESPIRATORY_TRACT
  Filled 2022-10-25: qty 3

## 2022-10-25 MED ORDER — POLYETHYLENE GLYCOL 3350 17 G PO PACK: 17.0000 g | PACK | Freq: Every day | ORAL | Status: DC | PRN

## 2022-10-25 MED ORDER — ENOXAPARIN SODIUM 40 MG/0.4ML IJ SOSY
40.0000 mg | PREFILLED_SYRINGE | INTRAMUSCULAR | Status: DC
  Administered 2022-10-26 – 2022-10-27 (×2): 40 mg via SUBCUTANEOUS
  Filled 2022-10-25 (×2): qty 0.4

## 2022-10-25 MED ORDER — OXYCODONE HCL 5 MG PO TABS
10.0000 mg | ORAL_TABLET | ORAL | Status: DC | PRN
  Administered 2022-10-25 – 2022-10-26 (×2): 10 mg via ORAL
  Filled 2022-10-25 (×2): qty 2

## 2022-10-25 MED ORDER — HYDROMORPHONE HCL 1 MG/ML IJ SOLN
1.0000 mg | Freq: Once | INTRAMUSCULAR | Status: AC
  Administered 2022-10-25: 1 mg via INTRAVENOUS
  Filled 2022-10-25: qty 1

## 2022-10-25 MED ORDER — VANCOMYCIN HCL 1750 MG/350ML IV SOLN
1750.0000 mg | Freq: Once | INTRAVENOUS | Status: DC
  Filled 2022-10-25: qty 350

## 2022-10-25 MED ORDER — SODIUM CHLORIDE 0.9 % IV SOLN: 2.0000 g | Freq: Three times a day (TID) | INTRAVENOUS | Status: DC

## 2022-10-25 MED ORDER — DOCUSATE SODIUM 100 MG PO CAPS: 100.0000 mg | ORAL_CAPSULE | Freq: Two times a day (BID) | ORAL | Status: DC | PRN

## 2022-10-25 NOTE — Progress Notes (Signed)
Pharmacy Antibiotic Note  Ronnie [name] is a 47 y.o. male admitted on 10/25/2022 with pneumonia.  Pharmacy has been consulted for cefepime and vancomycin dosing. Patient has reported  URT symptoms of wheezing, chills, fever, palpitations, and body aches. He has recently traveled to [facility] and exposed to many people. He denies any recent antibiotics. He did receive one dose of Unasyn around 2130, prior to consult. WBC 12, sCr 0.5, lactate 1.5, and afebrile.   Plan: Vancomycin 1750 mg IV x1 Vancomycin 1250 mg IV every 12 hours (AUC 473, sCr 0.8, Vd 0.72) Cefepime 2 g IV every 8 hours (timed out when next unasyn dose would be) Monitor renal function  Follow up WBC, fever curve, MRSA swab, signs of clinical improvement  Height: 5\' 11"  (180.3 cm) Weight: 70.3 kg (155 lb) IBW/kg (Calculated) : 75.3  Temp (24hrs), Avg:98.6 F (37 C), Min:98.5 F (36.9 C), Max:98.7 F (37.1 C)  Recent Labs  Lab 10/25/22 2004 10/25/22 2012  WBC 12.1*  --   CREATININE 0.56* 0.50*  LATICACIDVEN 1.5  --     Estimated Creatinine Clearance: 112.3 mL/min (A) (by C-G formula based on SCr of 0.5 mg/dL (L)).    Allergies  Allergen Reactions   Lisinopril Cough    Antimicrobials this admission: Cefepime 6/2 >>  Vancomycin 6/1 >>   Microbiology results: 6/1 BCx:  6/1 MRSA PCR:   Thank you for allowing pharmacy to be a part of this patient's care.  Arabella Merles, PharmD. [facility] Acute Care PGY-1 10/25/2022 10:10 PM

## 2022-10-25 NOTE — ED Notes (Signed)
Patient in notable respiratory distress, coughing and using oral suction frequently. O2 sats maintaining through coughing spells.

## 2022-10-25 NOTE — Consult Note (Signed)
Neurology Consultation  Reason for Consult: Worsening shortness of breath in a patient with muscular dystrophy Referring Physician: Dr. Renaye Rakers  CC: Worsening shortness of breath in a patient with muscular dystrophy  History is obtained from: Patient, chart  HPI: Ronnie [name] is a 47 y.o. male with hypertension, prior substance use and history of muscular dystrophy for which she was being followed at [facility] in [place] and recently moved to the area to be closer to family, presented for worsening shortness of breath over the past few days. He flew in from [place] a couple days ago on a transcontinental flight and has since been short of breath and feeling generally rundown.  His breathing has progressively gotten worse over the past 2 to 3 years. Chart review in Care Everywhere-last office note from neurology Dr. Erline Hau worsening PFTs and worsening proximal strength over time along with an extensive workup with negative myositis panel, negative HMG CR, negative CSF, negative musk and myasthenia antibodies, LFTs and A1c within normal limit, CK4 44 last check in 2022, HIV and hepatitis B&C negative, ANA negative, ESR normal, SSA SSB negative.  EMG nerve conduction studies consistent with diffuse proximal myopathy in both upper and lower extremities.  There was no evidence to suggest motor neuron disease.  MRI of the right femur in January 2022 with heterogeneous muscular edema and enhancement in the right adductor muscle group quadriceps muscle tensor fascia lata and hamstrings overall consistent findings with multifocal inflammatory myositis.  Muscle biopsy January 2022-consideration of broad myopathies and some mild fibrillar/dystrophic process likely based on report. 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 Although myositis panel was negative in the past, they did consider repeat myositis  panel at another lab although clinical suspicion for inflammatory myositis was deemed unlikely but his clinical course has not been very typical of either MD or inflammatory myopathies He has had steroids and IVIG trials that have been unsuccessful in the past  Neurological consultation was obtained due to his diagnosis of MDM further recommendations after he was found to have a sizable pneumonia in the right lower lobe.  He continues to use nonprescription medications including fentanyl for pain control because he has chronic pain and is very uncomfortable with this pain.  ROS: Full ROS was performed and is negative except as noted in the HPI   Past Medical History:  Diagnosis Date   Hypertension    Muscular dystrophy (HCC)    Substance abuse (HCC)      Family History  Problem Relation Age of Onset   Heart disease Mother    Heart disease Maternal Grandmother      Social History:   reports that he has been smoking cigarettes. He has a 12.50 pack-year smoking history. He has never used smokeless tobacco. He reports current alcohol use of about 6.0 standard drinks of alcohol per week. He reports that he does not currently use drugs after having used the following drugs: Cocaine.  Medications  Current Facility-Administered Medications:    [START ON 10/26/2022] ceFEPIme (MAXIPIME) 2 g in sodium chloride 0.9 % 100 mL IVPB, 2 g, Intravenous, Once, Arabella Merles, RPH   vancomycin (VANCOREADY) IVPB 1750 mg/350 mL, 1,750 mg, Intravenous, Once, Arabella Merles, [facility]  Current Outpatient Medications:    amLODipine (NORVASC) 5 MG tablet, TAKE 1 TABLET (5 MG TOTAL) BY MOUTH DAILY., Disp: 90 tablet, Rfl: 0   carvedilol (COREG) 12.5 MG tablet, TAKE 1 TABLET (12.5 MG TOTAL)  BY MOUTH 2 (TWO) TIMES DAILY., Disp: 180 tablet, Rfl: 3   oxyCODONE (ROXICODONE) 15 MG immediate release tablet, Take 15 mg by mouth every 4 (four) hours as needed for pain., Disp: , Rfl:    pregabalin (LYRICA) 150 MG capsule,  Take 150 mg by mouth 2 (two) times daily., Disp: , Rfl:    Exam: Current vital signs: BP (!) 138/103   Pulse (!) 116   Temp 98.5 F (36.9 C) (Oral)   Resp (!) 35   Ht 5\' 11"  (1.803 m)   Wt 70.3 kg   SpO2 100%   BMI 21.62 kg/m  Vital signs in last 24 hours: Temp:  [98.5 F (36.9 C)-98.7 F (37.1 C)] 98.5 F (36.9 C) (06/01 1932) Pulse Rate:  [109-123] 116 (06/01 2115) Resp:  [13-38] 35 (06/01 2115) BP: (128-155)/(95-121) 138/103 (06/01 2115) SpO2:  [93 %-100 %] 100 % (06/01 2115) Weight:  [70.3 kg] 70.3 kg (06/01 1931) General: Awake alert HEENT: Normocephalic atraumatic Lungs: Diminished breath sounds all over worst in the right lower lobe Extremities with very trace edema over the feet but none on the legs Neurological exam He is awake alert oriented x 3 He is a good historian He has no dysarthria No aphasia Cranial nerves: Pupils equal round react light, extraocular movements intact, visual fields full, face appears symmetric but there is some generalized weakness and orbicularis oris and orbicularis oculi. Motor examination he has a slightly stooped posture with significant proximal weakness in upper and lower extremities with strong handgrip and plantar dorsiflexion.  He has neck flexion and extension weakness with neck flexion at 4+/5 in neck extension at 4/5 Sensation intact DTRs absent  Labs I have reviewed labs in epic and the results pertinent to this consultation are:  CBC    Component Value Date/Time   WBC 12.1 (H) 10/25/2022 2004   RBC 4.20 (L) 10/25/2022 2004   HGB 13.6 10/25/2022 2012   HGB 14.0 06/09/2019 1541   HCT 40.0 10/25/2022 2012   HCT 40.3 06/09/2019 1541   PLT 292 10/25/2022 2004   PLT 194 06/09/2019 1541   MCV 94.0 10/25/2022 2004   MCV 93 06/09/2019 1541   MCH 29.8 10/25/2022 2004   MCHC 31.6 10/25/2022 2004   RDW 14.8 10/25/2022 2004   RDW 13.3 06/09/2019 1541   LYMPHSABS 3.6 10/25/2022 2004   LYMPHSABS 3.3 (H) 06/09/2019 1541    MONOABS 0.6 10/25/2022 2004   EOSABS 0.2 10/25/2022 2004   EOSABS 0.6 (H) 06/09/2019 1541   BASOSABS 0.0 10/25/2022 2004   BASOSABS 0.1 06/09/2019 1541    CMP     Component Value Date/Time   NA 143 10/25/2022 2012   NA 144 06/09/2019 1541   K 3.9 10/25/2022 2012   CL 104 10/25/2022 2012   CO2 25 10/25/2022 2004   GLUCOSE 94 10/25/2022 2012   BUN 5 (L) 10/25/2022 2012   BUN 11 06/09/2019 1541   CREATININE 0.50 (L) 10/25/2022 2012   CALCIUM 9.5 10/25/2022 2004   PROT 7.9 10/25/2022 2004   ALBUMIN 3.4 (L) 10/25/2022 2004   AST 12 (L) 10/25/2022 2004   ALT 12 10/25/2022 2004   ALKPHOS 81 10/25/2022 2004   BILITOT 0.7 10/25/2022 2004   GFRNONAA >60 10/25/2022 2004   GFRAA 80 06/09/2019 1541    Lipid Panel     Component Value Date/Time   CHOL 178 12/26/2016 1039   TRIG 95.0 12/26/2016 1039   HDL 67.40 12/26/2016 1039   CHOLHDL 3 12/26/2016  1039   VLDL 19.0 12/26/2016 1039   LDLCALC 92 12/26/2016 1039   Imaging I have reviewed the images obtained:  Chest x-ray did not show much abnormality but CT of the chest shows a large area of consolidation within the right lower lobe.  Additional groundglass opacities in the central middle lobe.  Findings suspicious for pneumonia versus aspiration.  Debris layering in the trachea tracking into the right mainstem bronchus.  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  Neurology was consulted for further recommendations Extensive review of his chart from [facility] in Care Everywhere reveals that he has been tried on steroids and IVIG without much success in the past at all. At this point, I would recommend continuing supportive care with management of his pneumonia and any infection. I discussed steroids versus IVIG and he did not seem very interested in either order.  I had entertained the idea of  trying high-dose steroids for 2 or 3 days but given that he has an active pneumonia and in the past steroids have not been very useful, I do not find that it may be the best choice for now.  Impression: Aspiration pneumonia in the setting of worsening myopathic process causing muscular weakness respiratory muscles  Recommendations: Antibiotics and chest PT per primary team Check an echocardiogram to ensure that there is no component of heart failure contributing to this which may need management. Will avoid steroids as discussed above. Appreciate pulmonology consult for consideration for bronchoscopy At this time, there is no treatment option that is available that may help his muscular weakness but respiratory support and airway management would be the mainstay of treatment. Goals of care conversation with palliative medicine team involvement should be started because this process is only likely to get worse as there is no treatment and I do not anticipate him getting substantially better over time. A neuromuscular neurology consultation locally should be obtained with [facility] neurology-Dr. Allena Katz or Dr. Loleta Chance as soon as able to be discharged to have the most up-to-date expert opinion.   Inpatient neurology will be available as needed.  Please call with questions.  Plan was discussed with Dr. Renaye Rakers.  Plan was also relayed to Dr. Wynona Neat from PCCM.   -- Milon Dikes, MD Neurologist [facility] Pager: [phone]

## 2022-10-25 NOTE — Progress Notes (Addendum)
Patient was reassessed by myself after he was seen by family medicine service, concerned with his tachycardia and his weakness  Will admit him to the ICU for close monitoring  I did discuss with him about his diagnosis of sleep apnea in the past -Never really started CPAP therapy  Heart rate about 120s, saturating 100% on room air Does sound congested  Dr. Bess Harvest notes reviewed regarding neurological workup -Labeling for his myopathy is unclear -Muscle biopsy January 2022-mild fibroblast/dystrophic process based on report  Will admit to ICU

## 2022-10-25 NOTE — Progress Notes (Addendum)
Progress Note Service Pager: [phone]  Patient name: Ronnie [name] Medical record number: [id number] Date of Birth: 1975-10-09 Age: 47 y.o. Gender: male  Primary Care Provider: Claiborne Rigg, NP Consultants: CCM, neurology  Code Status: Patient requests NO chest compressions, no shock therapy, no medications to re-start heart. He DOES want intubation if indicated.  Preferred Emergency Contact:  Contact Information     Name Relation Home Work Mobile   Dunlap,Deborah Significant other [phone]  [phone]      Assessment and Plan:   Dyspnea  Cough  Noted on CT scan, large consolidation with ground glass opacities, mucoid impaction. Has h/o intubation in the past with PNA. Baseline with normal MIP, severely reduced MEP per PFT from CareEverywhere. NIF performed -30,0-25,-25 per RRT. Patient appears to primarily have PNA as the cause of his dyspnea, possibly with a contribution of systolic heart failure and deconditioning. Concern for PE given tachycardia and tachypnea? After discussion with CCM, felt he met criteria to admit to ICU for closer monitoring. Patient has a higher likelihood to require intubation but was maintaining oxygen saturation in room.  -Appreciate assistance of CCM for admitted patient, will accept care of patient once moved to step down unit  -Needs UTD echo  -abx for PNA -MRSA swab -NPO -bcx -sputum cx -pulm toilet  -legionella and strep pna swabs  -consider CTA -NIF q12  Advanced Muscular dystrophy  Neurology consulted for assistance. Has been tried on IVIG and steroids with minimal benefit in the past. Avoiding steroids for now. Per neurology, neuromuscular neurology consultation locally should be obtained with [facility] neurology-Dr. Allena Katz or Dr. Loleta Chance as soon as able to be discharged. Neurology not formally following, will re-consult if indicated.   HTN Restart home Losartan, Amlodipine, and Coreg as indicated   Tachycardia  Most likely  multifactorial in the presence of acute illness. Sinus tachy on monitor. UTD EKG ordered, LR running. Consider CTA. Coreg restarted.   OSA Needs Bipap at night, meets criteria per last PFT  Systolic heart failure  NICM UTD echo indicated, last from 2022 LVEF 50%, GLS -18.4%. No LVH, PFO. NYHA class II. Previously had EF 15% but has recovered nicely.  -UTD echo -May benefit from cardiology input here  Normocytic Anemia  ACD? Unknown cause, no active bleeding. Unknown if UTD on colonoscopy. Check ferritin at some point for further differentiation.   Goals of Care Patient exceedingly anxious about progression of muscular dystrophy and needs further goals of care conversations, ideally with wife. Will need TOC consult when out of ICU   Subjective:  Ronnie [name] is a 47 y.o. male presenting with dyspnea, cough, and mucous production onset over the last couple of days but worsening today. Has been travelling from [place] [facility], plans to relocate here in [place]. Staying with wife's brother. No known sick contacts, no fevers but with significant chills. Reports that he has needed an intubation in the past.   Patient reports that he has a neurologist in [place], not here. Has had an EF of 15% in the past, recovered EF to 50% per echo 2022 in CareEverywhere.   Objective: BP (!) 137/108   Pulse (!) 124   Temp 98.5 F (36.9 C) (Oral)   Resp (!) 36   Ht 5\' 11"  (1.803 m)   Wt 70.3 kg   SpO2 100%   BMI 21.62 kg/m  Exam: General: Non toxic, cachetic, appropriately answers questioning  Eyes: EOMI ENTM: No abnormalities, multiple dental caries  Neck: moderate JVD  Cardiovascular: RRR, no m/g/r Respiratory: Diffusely coarse, crackles more prevalent on right, difficult due to upper airway sounds  Gastrointestinal: NTTP, frail  MSK: Able to move extremities  Derm: No rash Neuro: A&Ox3, baseline neurologic exam Psych: Anxious mood   Labs:  CBC BMET  Recent Labs  Lab 10/25/22 2004  10/25/22 2011 10/25/22 2012  WBC 12.1*  --   --   HGB 12.5*   < > 13.6  HCT 39.5   < > 40.0  PLT 292  --   --    < > = values in this interval not displayed.   Recent Labs  Lab 10/25/22 2004 10/25/22 2011 10/25/22 2012  NA 141   < > 143  K 3.9   < > 3.9  CL 105  --  104  CO2 25  --   --   BUN 5*  --  5*  CREATININE 0.56*  --  0.50*  GLUCOSE 99  --  94  CALCIUM 9.5  --   --    < > = values in this interval not displayed.      EKG: Non specific T wave abnormalities in the lateral leads  Present on EKG in care everywhere in 2022   Imaging Studies Performed:  CT Chest Wo Contrast  Result Date: 10/25/2022 IMPRESSION: 1. Large area of consolidation within the dependent right lower lobe with adjacent ill-defined and ground-glass opacities. Additional ground-glass and patchy opacity within the central right middle lobe. Findings are suspicious for pneumonia. Given the debris layering in the trachea and right-sided bronchi, aspiration is considered. Recommend radiographic follow-up with PA and lateral views after course of treatment to ensure resolution. 2. Debris layering in the trachea tracking into the right mainstem bronchus, bronchus intermedius, and right lower lobe with occasional areas of mucoid impaction. Additional areas of mucoid impaction in the right upper lobe. As above, consider aspiration. 3. Shotty mediastinal nodes are likely reactive. Aortic Atherosclerosis (ICD10-I70.0) and Emphysema (ICD10-J43.9).   DG Chest Portable 1 View Result Date: 10/25/2022 IMPRESSION: No active disease.  Personally reviewed by me, agree with impression    Alfredo Martinez, MD 10/25/2022, 11:05 PM PGY-2, [facility] Family Medicine  FPTS Intern pager: [phone], text pages welcome Secure chat group [facility] Teaching Service

## 2022-10-25 NOTE — ED Provider Notes (Signed)
[facility]    CSN: [id number] Arrival date & time: 10/25/22  1751      History   Chief Complaint Chief Complaint  Patient presents with   Chest Congestion    HPI Ronnie [name] is a 47 y.o. male.   Patient presents today accompanied by 2 family members.  He reports that for the past 2 days he has had URI symptoms with wheezing, chills, fever, palpitations, body aches.  He has tried over-the-counter Robitussin without improvement of symptoms.  Denies any known sick contacts but did recently travel to [place] and so is exposed to many people.  This was a total of a 7 hours in the airplane.  He has had COVID-19 vaccines but has not COVID.  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  He denies any recent antibiotics.  He does report some leg swelling as well as widespread body aches.  Denies any associated chest pain.  He denies history of VTE event and is not chronically anticoagulated.    Past Medical History:  Diagnosis Date   Hypertension    Muscular dystrophy (HCC)    Substance abuse [facility])     Patient Active Problem List   Diagnosis Date Noted   LVH (left ventricular hypertrophy) 05/26/2019   Routine adult health maintenance 12/26/2016   Sleep apnea 12/26/2016   NICM (nonischemic cardiomyopathy) (HCC) 10/03/2016   Essential hypertension 10/03/2016   Encounter for orogastric (OG) tube placement    Altered mental state 09/11/2016   Acute kidney injury (HCC) 09/11/2016   Acute respiratory failure (HCC) 09/11/2016   Lactic acidosis 09/11/2016   Smoker 09/11/2016   ETOH abuse 09/11/2016   Cocaine abuse (HCC) 09/11/2016    Past Surgical History:  Procedure Laterality Date   NO PAST SURGERIES     OTHER SURGICAL HISTORY     No surgical history        Home Medications    Prior to Admission medications    Medication Sig Start Date End Date Taking? Authorizing Provider  carvedilol (COREG) 12.5 MG tablet TAKE 1 TABLET (12.5 MG TOTAL) BY MOUTH 2 (TWO) TIMES DAILY. 04/24/20 10/25/22 Yes Claiborne Rigg, NP  oxyCODONE (ROXICODONE) 15 MG immediate release tablet Take 15 mg by mouth every 4 (four) hours as needed for pain.   Yes [provider]  pregabalin (LYRICA) 150 MG capsule Take 150 mg by mouth 2 (two) times daily.   Yes [provider]  amLODipine (NORVASC) 5 MG tablet TAKE 1 TABLET (5 MG TOTAL) BY MOUTH DAILY. 04/24/20 04/24/21  Claiborne Rigg, NP    Family History Family History  Problem Relation Age of Onset   Heart disease Mother    Heart disease Maternal Grandmother     Social History Social History   Tobacco Use   Smoking status: Every Day    Packs/day: 0.50    Years: 25.00    Additional pack years: 0.00    Total pack years: 12.50    Types: Cigarettes   Smokeless tobacco: Never  Vaping Use   Vaping Use: Never used  Substance Use Topics   Alcohol use: Yes    Alcohol/week: 6.0 standard drinks of alcohol    Types: 6 Cans of beer per week    Comment: occ   Drug use: Not Currently    Types: Cocaine  Allergies   Lisinopril   Review of Systems Review of Systems  Constitutional:  Positive for activity change and fever. Negative for appetite change and fatigue.  HENT:  Positive for congestion. Negative for sinus pressure, sneezing and sore throat.   Respiratory:  Positive for cough and shortness of breath.   Cardiovascular:  Positive for palpitations and leg swelling. Negative for chest pain.  Gastrointestinal:  Negative for abdominal pain, diarrhea, nausea and vomiting.  Musculoskeletal:  Positive for arthralgias and myalgias.     Physical Exam Triage Vital Signs ED Triage Vitals  Enc Vitals Group     BP 10/25/22 1813 (!) 128/95     Pulse Rate 10/25/22 1813 (!) 120     Resp 10/25/22 1813 16     Temp 10/25/22 1813 98.7 F (37.1 C)      Temp Source 10/25/22 1813 Oral     SpO2 10/25/22 1813 93 %     Weight 10/25/22 1813 155 lb (70.3 kg)     Height 10/25/22 1813 5\' 11"  (1.803 m)     Head Circumference --      Peak Flow --      Pain Score 10/25/22 1812 0     Pain Loc --      Pain Edu? --      Excl. in GC? --    No data found.  Updated Vital Signs BP (!) 128/95 (BP Location: Left Arm)   Pulse (!) 120   Temp 98.7 F (37.1 C) (Oral)   Resp 16   Ht 5\' 11"  (1.803 m)   Wt 155 lb (70.3 kg)   SpO2 93%   BMI 21.62 kg/m   Visual Acuity Right Eye Distance:   Left Eye Distance:   Bilateral Distance:    Right Eye Near:   Left Eye Near:    Bilateral Near:     Physical Exam Vitals reviewed.  Constitutional:      General: He is awake.     Appearance: Normal appearance. He is well-developed. He is ill-appearing.     Comments: Very pleasant male appears older than stated age sitting in wheelchair in no acute distress chronically ill-appearing  HENT:     Head: Normocephalic and atraumatic.     Right Ear: External ear normal.     Left Ear: External ear normal.     Nose: Nose normal.     Mouth/Throat:     Pharynx: Uvula midline. No oropharyngeal exudate or posterior oropharyngeal erythema.  Cardiovascular:     Rate and Rhythm: Regular rhythm. Tachycardia present.     Heart sounds: Normal heart sounds, S1 normal and S2 normal. No murmur heard. Pulmonary:     Effort: Pulmonary effort is normal. No accessory muscle usage or respiratory distress.     Breath sounds: No stridor. Wheezing and rhonchi present. No rales.     Comments: Widespread wheezing and rhonchi throughout lung fields. Neurological:     Mental Status: He is alert.  Psychiatric:        Behavior: Behavior is cooperative.      UC Treatments / Results  Labs (all labs ordered are listed, but only abnormal results are displayed) Labs Reviewed - No data to display  EKG   Radiology No results found.  Procedures Procedures (including critical  care time)  Medications Ordered in UC Medications - No data to display  Initial Impression / Assessment and Plan / UC Course  I have reviewed the triage vital signs and the nursing notes.  Pertinent labs & imaging results that were available during my care of the patient were reviewed by me and considered in my medical decision making (see chart for details).     Patient is tachycardic and initially had oxygen saturation of 93% which is decreased from his baseline of 98 to 100%.  Given his recent travel we discussed that he has risk factors for PE and based on PERC score of 2 I recommended he be evaluated in the emergency room since we do not have access to stat labs or ultrasound tonight.  We also discussed that his symptoms could be related to pneumonia and he could be developing SIRS related to infection particularly given his inability to productively cough.  Discussed that I am not comfortable just giving him an antibiotic to think he needs a more thorough workup given his medical history we do not have access to this in urgent care.  Patient and family were agreeable to go to the emergency room.  They did not feel that they could take him themselves due to his medical condition and so [facility] was called for transport.  He was stable at time of discharge.  Final Clinical Impressions(s) / UC Diagnoses   Final diagnoses:  Tachycardia  Shortness of breath  Acute cough   Discharge Instructions   None    ED Prescriptions   None    PDMP not reviewed this encounter.   Jeani Hawking, PA-C 10/25/22 1900

## 2022-10-25 NOTE — Progress Notes (Signed)
RN called RT to assess for NT suctioning needs. Pt was very congested and having trouble getting the secretions up. I NTS patient with 1 pass and got minimal amount of thick clear secretions but did stimulate a cough and patient was then able to move some secretions up to the back of his throat and use the yanker to suction out. Pt is feeling better now and breathing easier. Sat 100% on RA throughtout this time.    NIF completed with good patient effort and proper technique.   -30,0-25,-25  Pt expressed that he has just moved her from [facility] and he does not have a neurologist here. He feels like he is declining and states "I know I am dying, I will be 47 in November, if I make it that long."  Nelda Marseille

## 2022-10-25 NOTE — Consult Note (Signed)
   NAME:  Ronnie [name], MRN:  [id number], DOB:  08/21/1975, LOS: 0 ADMISSION DATE:  10/25/2022, CONSULTATION DATE:  10/25/2022 REFERRING MD:  Dr Renaye Rakers, CHIEF COMPLAINT: Cough, congestion  History of Present Illness:  Patient came into the hospital today with shortness of breath, cough, congestion, wheezing  This started following a recent trip from [place]  Has had pneumonias in the past but the last one was about 2 years ago History of muscular dystrophy with progressive limitations Needs assistance with all activities Able to cough but not able to clear secretions effectively Has not been around any sick contacts recently No fevers, no chills Active smoker  Has not been on any antibiotics recently  He is on pain medications He reported that he was tried on steroids previously that did not help his muscular dystrophy   Pertinent  Medical History   Past Medical History:  Diagnosis Date   Hypertension    Muscular dystrophy (HCC)    Substance abuse (HCC)      Significant Hospital Events: Including procedures, antibiotic start and stop dates in addition to other pertinent events   10/25/2022-CT scan of the chest was reviewed showing consolidation at the right base and mucus in the airway on the right side mostly  Interim History / Subjective:  Middle-age, does not appear to be in distress Saturating at 100% on room air Some congestion  Objective   Blood pressure (!) 138/103, pulse (!) 116, temperature 98.5 F (36.9 C), temperature source Oral, resp. rate (!) 35, height 5\' 11"  (1.803 m), weight 70.3 kg, SpO2 100 %.       No intake or output data in the 24 hours ending 10/25/22 2200 Filed Weights   10/25/22 1931  Weight: 70.3 kg    Examination: General: Middle-age, does appear comfortable HENT: Moist oral mucosa Lungs: Bilateral rhonchi, rales at the bases Cardiovascular: S1-S2 appreciated Abdomen: Soft, bowel sounds appreciated Extremities: No clubbing, no  edema Neuro: Significant weakness, decreased muscle strength all over GU:   White count is 12.1, hematocrit of 40, Platelet of 292 BUN of 5, creatinine 0.5, BNP of 269  Last echocardiogram 06/22/2019 showing an ejection fraction of 35-40%, mildly dilated left ventricular internal cavity, normal function on the right  Resolved Hospital Problem list     Assessment & Plan:  Right lower lobe pneumonia  Muscular dystrophy  History of hypertension Nonischemic cardiomyopathy History of sleep apnea  Active smoker  Past history of substance abuse  Severe muscle weakness from his muscular dystrophy affecting his ability to clear secretions  Can be admitted to hospitalist service, progressive floor may be appropriate  Pulmonary toileting including use of incentive spirometer, flutter device, chest PT Chest PT with left side being dependent may help in mobilizing secretions In the short-term we will add Mucomyst to facilitate mobilization of secretions Nebulization treatment with DuoNeb as needed Treat as community-acquired pneumonia with Rocephin and azithromycin MRSA PCR Follow-up on cultures  Saturating well on room air at present  Will not recommend bronchoscopy at present unless significant atelectasis occurs  Spoke with Dr. Wilford Corner, neurology at bedside (He will need outpatient follow-up with either Nita Sickle or Jacquelyne Balint at [facility] neurology-both of them are neuromuscular neurologist)  Virl Diamond, MD [facility] Pager: See Loretha Stapler

## 2022-10-25 NOTE — ED Notes (Signed)
Patient requested I call his wife to update her, contact successful.

## 2022-10-25 NOTE — H&P (Signed)
Consult note documented 10/25/2022 at 10 PM will serve as history and physical note for patient

## 2022-10-25 NOTE — ED Triage Notes (Signed)
Patient here today with c/o chest congestion, SOB, wheeze, runny nose, fever, chills, sweats, and body aches. He has muscular dystrophy and unable to cough anything up. He has taken Robitussin which helped a little last night. No sick contacts. He is visiting here from [place] and flew down here on Wednesday. He states that the flight took a lot out of him.

## 2022-10-25 NOTE — ED Triage Notes (Addendum)
Pt arrives via [facility] from [facility] Care where he presented with SOB, congestion, tachypnea, and tachycardia. Cold S/S started Wednesday after returning from [place]. No known sick exposure. Hx muscular dystrophy, has had difficulty clearing secretions. Endorses using fentanyl after running out of his prescribed percocet's and is a everyday smoker.

## 2022-10-25 NOTE — ED Notes (Signed)
Patient placed into hospital bed for comfort, as he has planned admission.

## 2022-10-25 NOTE — ED Provider Notes (Signed)
[place] EMERGENCY DEPARTMENT AT [facility] Provider Note   CSN: [id number] Arrival date & time: 10/25/22  1927     History  Chief Complaint  Patient presents with   Shortness of Breath    Ronnie [name] is a 47 y.o. male with reported history of muscular dystrophy, presented to ED with generalized weakness and shortness of breath.  The patient presents as a referral from urgent care where he was brought in for 2 days of wheezing, cough, chills and bodyaches.  The patient uses BiPAP intermittently at home.  Medical records reviewed per external records from [place], [place] [facility], at the beginning of May, patient was seen in the neuromuscular clinic for muscular dystrophy, with PFT's with FVR at 43% recommending bipap at home.    From 09/26/22 Neuro office note by Dr Mel Almond  Disease history: Briefly, he reports onset of severe muscle aching and cramping since September 2021. Soon after, he began to have proximal leg weakness, and had proximal arm involvement beginning around October 2021. This has been progressive and he had also started to have dysphagia for which he was admitted in January 2022. 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 MRI of neuraxis and extensive lab testing including LP has been unrevealing. After muscle biopsy was completed, he was tried on the following therapies: corticosteroids (IV solumedrol x3 days then high dose prednisone 60mg  which was tapered down over several months to 10mg  daily), IVIG (07/2020). He saw PT initially but stopped 07/2020 as he could not tolerate muscle pain during sessions. 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 He reported that this had improved, as his ECHO in 06/22/2019 showed EF 35-40%, but a TTE 10/25/2020 showed EF 51%. He has no known FH of muscle or neuromuscular disease, but he has very strong family history of cardiac disease. He does have personal history of polysubstance abuse including ETOH.   HPI     Home Medications Prior to Admission medications   Medication Sig Start Date End Date Taking? Authorizing Provider  amLODipine (NORVASC) 5 MG tablet TAKE 1 TABLET (5 MG TOTAL) BY MOUTH DAILY. 04/24/20 04/24/21  Claiborne Rigg, NP  carvedilol (COREG) 12.5 MG tablet TAKE 1 TABLET (12.5 MG TOTAL) BY MOUTH 2 (TWO) TIMES DAILY. 04/24/20 10/25/22  Claiborne Rigg, NP  oxyCODONE (ROXICODONE) 15 MG immediate release tablet Take 15 mg by mouth every 4 (four) hours as needed for pain.    [provider]  pregabalin (LYRICA) 150 MG capsule Take 150 mg by mouth 2 (two) times daily.    [provider]      Allergies    Lisinopril    Review of Systems   Review of Systems  Physical Exam Updated Vital Signs BP (!) 137/108   Pulse (!) 124   Temp 98.5 F (36.9 C) (Oral)   Resp (!) 36   Ht 5\' 11"  (1.803 m)   Wt 70.3 kg   SpO2 100%   BMI 21.62 kg/m  Physical Exam Constitutional:      General: He is not in acute distress.    Comments: Thin, frail, cachectic appearing  HENT:  Head: Normocephalic and atraumatic.  Eyes:     Conjunctiva/sclera: Conjunctivae normal.     Pupils: Pupils are equal, round, and reactive to light.  Cardiovascular:     Rate and Rhythm: Regular rhythm. Tachycardia present.  Pulmonary:     Effort: Pulmonary effort is normal. No respiratory distress.     Comments: Rhonchi bilaterally Abdominal:     General: There is no distension.     Tenderness: There is no abdominal tenderness.  Skin:    General: Skin is warm and dry.  Neurological:     Mental  Status: He is alert. Mental status is at baseline.     Comments: Generalized weakness of the muscles on exam  Psychiatric:        Mood and Affect: Mood normal.        Behavior: Behavior normal.     ED Results / Procedures / Treatments   Labs (all labs ordered are listed, but only abnormal results are displayed) Labs Reviewed  COMPREHENSIVE METABOLIC PANEL - Abnormal; Notable for the following components:      Result Value   BUN 5 (*)    Creatinine, Ser 0.56 (*)    Albumin 3.4 (*)    AST 12 (*)    All other components within normal limits  CBC WITH DIFFERENTIAL/PLATELET - Abnormal; Notable for the following components:   WBC 12.1 (*)    RBC 4.20 (*)    Hemoglobin 12.5 (*)    All other components within normal limits  BRAIN NATRIURETIC PEPTIDE - Abnormal; Notable for the following components:   B Natriuretic Peptide 269.0 (*)    All other components within normal limits  I-STAT VENOUS BLOOD GAS, ED - Abnormal; Notable for the following components:   pO2, Ven 25 (*)    All other components within normal limits  I-STAT CHEM 8, ED - Abnormal; Notable for the following components:   BUN 5 (*)    Creatinine, Ser 0.50 (*)    Calcium, Ion 1.14 (*)    All other components within normal limits  RESP PANEL BY RT-PCR (RSV, FLU A&B, COVID)  RVPGX2  MRSA NEXT GEN BY PCR, NASAL  CULTURE, BLOOD (ROUTINE X 2)  CULTURE, BLOOD (ROUTINE X 2)  LACTIC ACID, PLASMA  RAPID URINE DRUG SCREEN, HOSP PERFORMED  HIV ANTIBODY (ROUTINE TESTING W REFLEX)  CBC  CREATININE, SERUM  STREP PNEUMONIAE URINARY ANTIGEN  LEGIONELLA PNEUMOPHILA SEROGP 1 UR AG  CBC  BASIC METABOLIC PANEL    EKG EKG Interpretation  Date/Time:  Saturday October 25 2022 19:35:53 EDT Ventricular Rate:  120 PR Interval:  128 QRS Duration: 76 QT Interval:  333 QTC Calculation: 471 R Axis:   37 Text Interpretation: Sinus tachycardia Abnormal R-wave progression, early transition Probable LVH with secondary repol abnrm  Nonspecific T wave inversions lateral leads, new since Oct 17 2018 Confirmed by Alvester Chou [phone]) on 10/25/2022 8:28:55 PM  Radiology CT Chest Wo Contrast  Result Date: 10/25/2022 CLINICAL DATA:  Pneumonia, complication suspected, xray done EXAM: CT CHEST WITHOUT CONTRAST TECHNIQUE: Multidetector CT imaging of the chest was performed following the standard protocol without IV contrast. RADIATION DOSE REDUCTION: This exam was performed according to the departmental dose-optimization program which includes automated exposure control, adjustment of the mA and/or kV according to patient size and/or use of iterative reconstruction technique. COMPARISON:  Radiograph earlier today. Included portions from cardiac CT 07/28/2017 FINDINGS: Cardiovascular: Heart size upper normal. No pericardial effusion. Mild atherosclerosis and tortuosity of the thoracic aorta. Mediastinum/Nodes:  Shotty mediastinal nodes including a 10 mm lower anterior paratracheal node series 3, image 63. There are small upper mediastinal nodes not enlarged by size criteria. Limited assessment for hilar adenopathy in the absence of IV contrast. Lungs/Pleura: Large area of consolidation within the dependent right lower lobe. Surrounding ill-defined and ground-glass opacities. Additional ground-glass and patchy opacity within the central right middle lobe. There is debris layering in the trachea tracking into the right mainstem bronchus, bronchus intermedius with occasional areas of mucoid impaction in the right lower lobe. Linear bandlike opacities throughout the left lower lobe favor atelectasis. Additional areas of mucoid impaction involving the right upper lobe. Mild apical predominant emphysema. Minimal right pleural effusion. Upper Abdomen: No acute upper abdominal findings. Musculoskeletal: There are no acute or suspicious osseous abnormalities. IMPRESSION: 1. Large area of consolidation within the dependent right lower lobe with adjacent  ill-defined and ground-glass opacities. Additional ground-glass and patchy opacity within the central right middle lobe. Findings are suspicious for pneumonia. Given the debris layering in the trachea and right-sided bronchi, aspiration is considered. Recommend radiographic follow-up with PA and lateral views after course of treatment to ensure resolution. 2. Debris layering in the trachea tracking into the right mainstem bronchus, bronchus intermedius, and right lower lobe with occasional areas of mucoid impaction. Additional areas of mucoid impaction in the right upper lobe. As above, consider aspiration. 3. Shotty mediastinal nodes are likely reactive. Aortic Atherosclerosis (ICD10-I70.0) and Emphysema (ICD10-J43.9). Electronically Signed   By: Narda Rutherford M.D.   On: 10/25/2022 20:58   DG Chest Portable 1 View  Result Date: 10/25/2022 CLINICAL DATA:  Shortness of breath.  Muscular dystrophy. EXAM: PORTABLE CHEST 1 VIEW COMPARISON:  None Available. FINDINGS: The heart size and mediastinal contours are within normal limits. Both lungs are clear. The visualized skeletal structures are unremarkable. IMPRESSION: No active disease. Electronically Signed   By: Gerome Sam III M.D.   On: 10/25/2022 19:46    Procedures .Critical Care  Performed by: Terald Sleeper, MD Authorized by: Terald Sleeper, MD   Critical care provider statement:    Critical care time (minutes):  45   Critical care time was exclusive of:  Separately billable procedures and treating other patients   Critical care was necessary to treat or prevent imminent or life-threatening deterioration of the following conditions:  Sepsis   Critical care was time spent personally by me on the following activities:  Ordering and performing treatments and interventions, ordering and review of laboratory studies, ordering and review of radiographic studies, pulse oximetry, review of old charts, examination of patient and evaluation of  patient's response to treatment   Care discussed with: admitting provider       Medications Ordered in ED Medications  azithromycin (ZITHROMAX) 500 mg in sodium chloride 0.9 % 250 mL IVPB (500 mg Intravenous New Bag/Given 10/25/22 2240)  ipratropium-albuterol (DUONEB) 0.5-2.5 (3) MG/3ML nebulizer solution 3 mL (has no administration in time range)  acetylcysteine (MUCOMYST) 20 % nebulizer / oral solution 2 mL (has no administration in time range)  cefTRIAXone (ROCEPHIN) 2 g in sodium chloride 0.9 % 100 mL IVPB (has no administration in time range)  docusate sodium (COLACE) capsule 100 mg (has no administration in time range)  polyethylene glycol (MIRALAX / GLYCOLAX) packet 17 g (has no administration in time range)  enoxaparin (LOVENOX) injection 40 mg (has no administration in time range)  lactated ringers infusion (has no administration in time range)  carvedilol (COREG) tablet 12.5 mg (has no administration  in time range)  pregabalin (LYRICA) capsule 150 mg (has no administration in time range)  oxyCODONE (Oxy IR/ROXICODONE) immediate release tablet 10 mg (has no administration in time range)  ipratropium-albuterol (DUONEB) 0.5-2.5 (3) MG/3ML nebulizer solution 3 mL (3 mLs Nebulization Given 10/25/22 2046)  HYDROmorphone (DILAUDID) injection 1 mg (1 mg Intravenous Given 10/25/22 2125)  sodium chloride 0.9 % bolus 1,000 mL (0 mLs Intravenous Stopped 10/25/22 2233)    ED Course/ Medical Decision Making/ A&P Clinical Course as of 10/25/22 2314  Sat Oct 25, 2022  2028 RT to perform NIF [MT]  2114 Patient was reassessed, remains tachycardic and somewhat hypertensive, but breathing more comfortably, after being suctioned by respiratory therapist.  Will plan for medical admission for pneumonia in the setting of muscular dystrophy.  We discussed with the neurologist and the pulmonologist regarding a treatment plan and level of care for admission.  At this point he is maintaining his airway and there is  no indication for intubation.  The patient typically sleeps with CPAP at night. [MT]  2139 Neuro and Pulm consulted - pulmonologist recommending broadening antibiotics given the patient's recurring hospitalizations and high risk of HCAP and MRSA, with vancomycin and cefepime, which have been ordered.  Neurologist will evaluate the patient but no immediate treatment recommendations.  Patient is stable for admission at this time. [MT]    Clinical Course User Index [MT] Jasmyn Picha, Kermit Balo, MD                             Medical Decision Making Amount and/or Complexity of Data Reviewed Labs: ordered. Radiology: ordered.  Risk Prescription drug management. Decision regarding hospitalization.   This patient presents to the ED with concern for fevers, shortness of breath, respiratory difficulty. This involves an extensive number of treatment options, and is a complaint that carries with it a high risk of complications and morbidity.  The differential diagnosis includes pneumonia versus viral URI versus anemia versus other  Co-morbidities that complicate the patient evaluation: Advanced muscular dystrophy at high risk of respiratory failure  Additional history obtained from EMS  External records from outside source obtained and reviewed including records from [place], [place], neuromuscular clinic as noted above  I ordered and personally interpreted labs.  The pertinent results include: Lactate within normal limits.  Minor leukocytosis white blood cell count 12.1.  CMP largely unremarkable.  Venous gas with no acidosis.  COVID and flu test are negative.  I ordered imaging studies including x-ray of the chest, CT of the chest I independently visualized and interpreted imaging which showed right lower lobe infiltrate; mucous plugging noted, I agree with the radiologist interpretation  The patient was maintained on a cardiac monitor.  I personally viewed and interpreted the cardiac monitored  which showed an underlying rhythm of: Sinus tachycardia  Per my interpretation the patient's ECG shows sinus tachycardia  I ordered medication including broad-spectrum antibiotics for suspected hospital-acquired pneumonia.  Patient initially being given Unasyn for suspected aspiration pneumonia, but upon my discussion with the pulmonary specialist, we have broaden him to vancomycin and cefepime.  He also received a bolus of fluids as well as 1 breathing treatment.  I have reviewed the patients home medicines and have made adjustments as needed  Test Considered: I do have a lower suspicion for acute pulmonary embolism at this time, given the likely source of his respiratory symptoms and tachycardia are this pulmonary infiltrate.  I do not see an  indication for an angiogram imaging.  I requested consultation with the neurology, pulmonology,  and discussed lab and imaging findings as well as pertinent plan - they recommend: See ED course  After the interventions noted above, I reevaluated the patient and found that they have: improved  Respiratory status appears improved and stabilized here in the ED.  He is not requiring BiPAP or intubation.   Dispostion:  After consideration of the diagnostic results and the patients response to treatment, I feel that the patent would benefit from medical admission.         Final Clinical Impression(s) / ED Diagnoses Final diagnoses:  Pneumonia of right lower lobe due to infectious organism  Muscular dystrophy [facility])    Rx / DC Orders ED Discharge Orders     None         Rana Adorno, Kermit Balo, MD 10/25/22 2315

## 2022-10-26 ENCOUNTER — Inpatient Hospital Stay (HOSPITAL_COMMUNITY): Payer: Medicare HMO

## 2022-10-26 DIAGNOSIS — I428 Other cardiomyopathies: Secondary | ICD-10-CM

## 2022-10-26 DIAGNOSIS — J181 Lobar pneumonia, unspecified organism: Secondary | ICD-10-CM

## 2022-10-26 LAB — ECHOCARDIOGRAM COMPLETE
Calc EF: 45.2 %
Height: 71 in
S' Lateral: 4.6 cm
Single Plane A2C EF: 45.8 %
Single Plane A4C EF: 41.1 %
Weight: 2239.87 oz

## 2022-10-26 LAB — CBC
HCT: 33.5 % — ABNORMAL LOW (ref 39.0–52.0)
Hemoglobin: 10.5 g/dL — ABNORMAL LOW (ref 13.0–17.0)
MCH: 29 pg (ref 26.0–34.0)
MCHC: 31.3 g/dL (ref 30.0–36.0)
MCV: 92.5 fL (ref 80.0–100.0)
Platelets: 263 10*3/uL (ref 150–400)
RBC: 3.62 MIL/uL — ABNORMAL LOW (ref 4.22–5.81)
RDW: 14.9 % (ref 11.5–15.5)
WBC: 13.3 10*3/uL — ABNORMAL HIGH (ref 4.0–10.5)
nRBC: 0 % (ref 0.0–0.2)

## 2022-10-26 LAB — BASIC METABOLIC PANEL
Anion gap: 10 (ref 5–15)
BUN: 5 mg/dL — ABNORMAL LOW (ref 6–20)
CO2: 22 mmol/L (ref 22–32)
Calcium: 8.4 mg/dL — ABNORMAL LOW (ref 8.9–10.3)
Chloride: 107 mmol/L (ref 98–111)
Creatinine, Ser: 0.53 mg/dL — ABNORMAL LOW (ref 0.61–1.24)
GFR, Estimated: >60 mL/min (ref >60)
Glucose, Bld: 93 mg/dL (ref 70–99)
Potassium: 3.7 mmol/L (ref 3.5–5.1)
Sodium: 139 mmol/L (ref 135–145)

## 2022-10-26 LAB — GLUCOSE, CAPILLARY: Glucose-Capillary: 79 mg/dL (ref 70–99)

## 2022-10-26 LAB — CULTURE, BLOOD (ROUTINE X 2)

## 2022-10-26 LAB — HIV ANTIBODY (ROUTINE TESTING W REFLEX): HIV Screen 4th Generation wRfx: NONREACTIVE

## 2022-10-26 MED ORDER — KETOROLAC TROMETHAMINE 15 MG/ML IJ SOLN
7.5000 mg | Freq: Three times a day (TID) | INTRAMUSCULAR | Status: AC
  Administered 2022-10-26 – 2022-10-31 (×15): 7.5 mg via INTRAVENOUS
  Filled 2022-10-26 (×15): qty 1

## 2022-10-26 MED ORDER — HYDROMORPHONE HCL 1 MG/ML IJ SOLN
1.0000 mg | Freq: Once | INTRAMUSCULAR | Status: AC
  Administered 2022-10-26: 1 mg via INTRAVENOUS
  Filled 2022-10-26: qty 1

## 2022-10-26 MED ORDER — HYDROMORPHONE HCL 1 MG/ML IJ SOLN: 1.0000 mg | Freq: Once | INTRAMUSCULAR | Status: DC

## 2022-10-26 MED ORDER — KETOROLAC TROMETHAMINE 15 MG/ML IJ SOLN
15.0000 mg | Freq: Four times a day (QID) | INTRAMUSCULAR | Status: AC | PRN
  Administered 2022-10-26 – 2022-10-27 (×3): 15 mg via INTRAVENOUS
  Filled 2022-10-26 (×3): qty 1

## 2022-10-26 MED ORDER — MORPHINE SULFATE (PF) 2 MG/ML IV SOLN
1.0000 mg | INTRAVENOUS | Status: DC | PRN
  Administered 2022-10-26 (×2): 1 mg via INTRAVENOUS
  Filled 2022-10-26 (×3): qty 1

## 2022-10-26 MED ORDER — LABETALOL HCL 5 MG/ML IV SOLN
5.0000 mg | INTRAVENOUS | Status: DC
  Administered 2022-10-26 – 2022-10-28 (×13): 5 mg via INTRAVENOUS
  Filled 2022-10-26 (×13): qty 4

## 2022-10-26 MED ORDER — FENTANYL CITRATE PF 50 MCG/ML IJ SOSY
25.0000 ug | PREFILLED_SYRINGE | INTRAMUSCULAR | Status: DC | PRN
  Filled 2022-10-26: qty 1

## 2022-10-26 MED ORDER — LIDOCAINE 5 % EX PTCH
1.0000 | MEDICATED_PATCH | CUTANEOUS | Status: DC
  Administered 2022-10-27 – 2022-10-30 (×4): 1 via TRANSDERMAL
  Filled 2022-10-26 (×4): qty 1

## 2022-10-26 MED ORDER — CHLORHEXIDINE GLUCONATE CLOTH 2 % EX PADS
6.0000 | MEDICATED_PAD | Freq: Every day | CUTANEOUS | Status: DC
  Administered 2022-10-26 – 2022-10-31 (×4): 6 via TOPICAL

## 2022-10-26 MED ORDER — FENTANYL CITRATE PF 50 MCG/ML IJ SOSY
50.0000 ug | PREFILLED_SYRINGE | INTRAMUSCULAR | Status: DC | PRN
  Administered 2022-10-26 – 2022-10-27 (×5): 50 ug via INTRAVENOUS
  Filled 2022-10-26 (×4): qty 1

## 2022-10-26 NOTE — Progress Notes (Signed)
CPT held at this time. Patient sleeping well in no distress.

## 2022-10-26 NOTE — Procedures (Addendum)
Modified Barium Swallow Study  Patient Details  Name: Ronnie [name] MRN: [id number] Date of Birth: [birth year]  Today's Date: 10/26/2022  Modified Barium Swallow completed.  Full report located under Chart Review in the Imaging Section.  History of Present Illness Ronnie [name] is a 47 y.o. male presenting with dyspnea, cough, and mucous production onset over the last couple of days but worsening today.  Found to have pneumonia. Chest CT 6/1: "Large area of consolidation within the dependent right lower lobe with adjacent ill-defined and ground-glass opacities. Additional ground-glass and patchy opacity within the central right middle lobe. Findings are suspicious for pneumonia. 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 Pt has been seen by ST in [place] for dysphagia and dysarthria with documentation in care everywhere.   Clinical Impression Pt presents with a moderate-severe pharyngeal dysphagia c/b delayed swallow initiation, decreased tongue base retraction, reduced hyolaryngeal elevation and excusion, incomplete laryngeal closure, reduced pharyngeal stripping wave, incomplete UES opening, and diminished sensation.  These deficits resulted in frank, silent aspiration of the entirety of initial teaspoon bolus of thin liquid.  There was no penetration or aspiration of puree or honey thick liquids.  There moderate-severe residue which was reduced with spontaneous subsequent swallows.  With nectar thick liquid there was trace, transient penetration above the level of the vocal folds.  With an additional trial of thin liquid there was silent static penetration reaching the level of the vocal folds. There appeared to be trace shallow penetration of residuals mixed with saliva  intermittently which cleared at least partially with subsequent swallows. Pt declined trials of solids. Pill simulation was not attempted. Pt is protecting airway with modified consistencies, but is noted to take very small bites/sips and PO intake is seemingly quite effortful with significantly reduced bolus passage through the UES with more viscous consistencies (i.e. puree and honey thick liquid). Pt may not be able to meet nutritional needs orally. Additionally pt had difficulty keeping head in neutral position and SLP had to assist pt with lifting head for PO trials. Given progressive nature of pt's condition and lack of progress with HEP when working with ST at [facility], swallowing therapy is not indicated at this time.  SLP will follow for diet tolerance and possible advancement.    Recommend puree diet with nectar thick liquids by cup.  Factors that may increase risk of adverse event in presence of aspiration [name] 2021): Weak cough;Frequent aspiration of large volumes;Frail or deconditioned  Swallow Evaluation Recommendations Recommendations: PO diet PO Diet Recommendation: Dysphagia 1 (Pureed);Mildly thick liquids (Level 2, nectar thick) Liquid Administration via: Cup Medication Administration: Crushed with puree Supervision: Full assist for feeding Swallowing strategies  : Slow rate;Small bites/sips;Multiple dry swallows after each bite/sip Postural changes: Position pt fully upright for meals;Stay upright 30-60 min after meals Oral care recommendations: Oral care BID (2x/day) Recommended consults: Consider dietitian consultation      Kerrie Pleasure, MA, CCC-SLP Acute Rehabilitation Services Office: [phone] 10/26/2022,3:18 PM

## 2022-10-26 NOTE — Evaluation (Signed)
Clinical/Bedside Swallow Evaluation Patient Details  Name: Ronnie [name] MRN: [id number] Date of Birth: [birth year]  Today's Date: 10/26/2022 Time: SLP Start Time (ACUTE ONLY): 0950 SLP Stop Time (ACUTE ONLY): 0957 SLP Time Calculation (min) (ACUTE ONLY): 7 min  Past Medical History:  Past Medical History:  Diagnosis Date   Hypertension    Muscular dystrophy (HCC)    Substance abuse (HCC)    Past Surgical History:  Past Surgical History:  Procedure Laterality Date   NO PAST SURGERIES     OTHER SURGICAL HISTORY     No surgical history    HPI:  Ronnie [name] is a 47 y.o. male presenting with dyspnea, cough, and mucous production onset over the last couple of days but worsening today.  Found to have pneumonia. Chest CT 6/1: "Large area of consolidation within the dependent right lower lobe with adjacent ill-defined and ground-glass opacities. Additional ground-glass and patchy opacity within the central right middle lobe. Findings are suspicious for pneumonia. 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 Pt has been seen by ST in [place] for dysphagia and dysarthria with documentation in care everywhere.    Assessment / Plan / Recommendation  Clinical Impression  Pt presents with clinical indicators of pharyngeal dysphagia.  Chest imaging is suspicious for aspiration.  Today pt required 5 swallows with small amounts of thin liquid by spoon.  There was mild throat clear.  Pt with very poor cough strength both voluntarily and reflexively.  Pt reports greater difficulty with liquids than solids, and especially with mixed consistencies which he avoids.  He also reports feeling of stasis. He endorses feeling of things going down the wrong way.  Pt has worked with ST in the past  at [facility] and documentation in care everywhere is very helpful.  He does not believe exercises provided in the past (effortful swallow, Masako, Mendelssohn, lingual press, and EMST) were particularly beneficial.  There is no instrumental swallow study documentation available and pt confirms he has only had clinical assesment/management.   He has also worked with speech therapy for dysarthria with education regarding AAC options if needed at a later date. Pt will need an instrumental swallowing evaluation prior to initiation of PO diet if pt is safe for PO intake. Pt is agreeable to either FEES or MBSS.  SLP will plan for further assessment as radiology and SLP schedule permit.   SLP Visit Diagnosis: Dysphagia, unspecified (R13.10)    Aspiration Risk  Severe aspiration risk    Diet Recommendation NPO        Other  Recommendations      Recommendations for follow up therapy are one component of a multi-disciplinary discharge planning process, led by the attending physician.  Recommendations may be updated based on patient status, additional functional criteria and insurance authorization.  Follow up Recommendations  (TBD)      Assistance Recommended at Discharge    Functional Status Assessment  (TBD)  Frequency and Duration  (TBD)          Prognosis Prognosis for improved oropharyngeal function:  (TBD)      Swallow Study   General Date of Onset: 10/25/22 HPI: Ronnie [name] is a 47 y.o. male presenting with dyspnea, cough, and mucous production onset over the last couple of days but worsening today.  Found to have pneumonia. Chest  CT 6/1: "Large area of consolidation within the dependent right lower lobe with adjacent ill-defined and ground-glass opacities. Additional ground-glass and patchy opacity within the central right middle lobe. Findings are suspicious for pneumonia. 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 Pt has been seen by ST in [place] for dysphagia and dysarthria with documentation in care everywhere. Type of Study: Bedside Swallow Evaluation Previous Swallow Assessment: in [place], clincal assessment Diet Prior to this Study: NPO Temperature Spikes Noted: No Respiratory Status: Nasal cannula History of Recent Intubation: No Behavior/Cognition: Alert;Cooperative;Pleasant mood Oral Cavity Assessment: Within Functional Limits Oral Care Completed by SLP: No Oral Cavity - Dentition: Adequate natural dentition Patient Positioning: Upright in bed Baseline Vocal Quality: Hoarse;Breathy Volitional Cough: Weak Volitional Swallow: Able to elicit    Oral/Motor/Sensory Function Overall Oral Motor/Sensory Function: Within functional limits Facial ROM: Within Functional Limits Facial Symmetry: Within Functional Limits Lingual ROM: Within Functional Limits (reduced protrusion) Lingual Symmetry: Within Functional Limits Lingual Strength: Reduced Velum:  (reduced elevation) Mandible: Within Functional Limits   Ice Chips Ice chips: Not tested   Thin Liquid Thin Liquid: Impaired Presentation: Spoon Pharyngeal  Phase Impairments: Wet Vocal Quality;Throat Clearing - Delayed;Multiple swallows    Nectar Thick Nectar Thick Liquid: Not tested   Honey Thick Honey Thick Liquid: Not tested   Puree Puree: Not tested   Solid     Solid: Not tested      Kerrie Pleasure, MA, CCC-SLP Acute Rehabilitation Services Office: [phone] 10/26/2022,10:50 AM

## 2022-10-26 NOTE — Progress Notes (Signed)
Echocardiogram 2D Echocardiogram has been performed.  Warren Lacy Monta Police RDCS 10/26/2022, 10:39 AM

## 2022-10-26 NOTE — IPAL (Signed)
  Interdisciplinary Goals of Care Family Meeting   Date carried out: 10/26/2022  Location of the meeting: Bedside  Member's involved: Physician and Bedside Registered Nurse  Durable Power of Attorney or acting medical decision maker: Patient     Discussion: We discussed goals of care for Ronnie [name] .  We discussed his current medical comorbidities the fact that he has had progressive weakness related to muscular dystrophy now has pneumonia.  We talked about his overall desires.  He does not want to be placed on mechanical life support after discussion of potential need for tracheostomy tube placement.  He does not want that and would not want to be on mechanical support for a long period of time.  He is also does not want chest compressions or shocks if he was to pass away or his heart was to stop.  We had a long discussion today at bedside and he is agreeable to this plan.  He would like continue antibiotics to see if he can improve at his current state.  Code status:   Code Status: DNR   Disposition: Continue current acute care  Time spent for the meeting: 16 mins    Josephine Igo, DO  10/26/2022, 9:34 AM

## 2022-10-26 NOTE — Progress Notes (Signed)
eLink Physician-Brief Progress Note Patient Name: Ronnie [name] DOB: 02-Nov-1975 MRN: [id number]   Date of Service  10/26/2022  HPI/Events of Note  47 year old male with a history of muscular dystrophy and previous substance use disorder who presented with Streptococcus pneumonia with significant pleuritic chest pain  He has been treated with ketorolac, intermittent morphine, boluses of Dilaudid with minimal benefit.  Fentanyl seem to have helped at some point.  Reviewed goals of care discussions from earlier today, maintain DNR.  eICU Interventions  Switch morphine as needed to sliding scale fentanyl.  Add on lidocaine patch.     Intervention Category Intermediate Interventions: Pain - evaluation and management  Emilyrose Darrah 10/26/2022, 10:42 PM

## 2022-10-26 NOTE — Progress Notes (Signed)
eLink Physician-Brief Progress Note Patient Name: Ronnie [name] DOB: 1975-09-20 MRN: [id number]   Date of Service  10/26/2022  HPI/Events of Note  47 year old male with a history of muscular dystrophy and substance use disorder initially presented with shortness of breath, cough, and wheezing in the setting of new pneumonia.  Initially tachypneic, tachycardic, and normotensive.  Placed on 4 L oxygen to maintain saturation 100%.  No significant pain over the right chest extending to his back.  Being treated with ceftriaxone and azithromycin and continuous IV fluids.  Strep pneumo antigen is positive.  Initial labs with leukocytosis.  Renal function within normal limits.  eICU Interventions  Appropriately treating presumptive Streptococcus pneumonia  Titrate Toradol in the setting of pleuritic chest pain   Lovenox for DVT prophylaxis.  GI prophylaxis not indicated.     Intervention Category Evaluation Type: New Patient Evaluation  Oaklynn Stierwalt 10/26/2022, 12:15 AM

## 2022-10-26 NOTE — Hospital Course (Addendum)
Ronnie [name] is a 47 y.o. male who was admitted to the [facility] Medicine Teaching Service at [facility] for PNA. PMH includes muscular dystrophy, HTN, and substance use. Hospital course is outlined below by problem.   PNA Presented to the ED with SOB, cough, chills, and body aches on 6/1.  Was tachycardic and hypertensive.  Placed on 4 L nasal cannula.  Uses BiPAP QHS at home in setting of muscular dystrophy/myofibrillar myopathy.  Pulmonology/CCM consulted who admitted patient and placed on azithromycin and cefepime for CAP, seen on CT Chest. He was ultimately weaned down to 2 L nasal cannula and transferred to FMTS on 6/3.  On transfer CCM expressed that there were not further options to optimize respiratory care, and patient's increased work of breathing and oxygen requirement are symptoms of his muscular dystrophy. Mucomyst and Duonebs continued through admission. On the floor, patient had steady oxygen requirement that was unable to be weaned and increased work of breathing. Due to muscular dystrophy and progressive decline, palliative care consulted as below. Patient comfort and air hunger improved with fentanyl gtt. Oxygen was continued for comfort at 2L [place] and patient was discharged to hospice.  Myofibrillar myopathy Neurologist consulted who recommended follow-up with neuromuscular specialist outpatient. Given declining respiratory status and progressing condition; palliative consulted and discussed goals of care. After several discussions regarding progression of MD and declining status clinically, ultimately family and patient decided to opt for comfort care measures, with some limited interventions at the family's request. Discharged to hospice.  CHF  NICM  Cardiomyopathy Echo updated this admission given some concern for contribution to dyspnea. EF 35-40%, LV global hypokinesis, mild MVR. EF previously 15% and then 50% in 2022. Significant tachycardia throughout admission attributed to pain and  muscular dystrophy disease progression of the heart. EKG demonstrated sinus tachycardia. Mild improvement with comfort care measures. Patient's home Losartan/Coreg initially restarted then was held as part of comfort care conversations.   Other conditions that were chronic and stable: HTN (home losartan, amlodipine, and coreg held), OSA  Issues for follow up: Pain control and comfort at hospice.

## 2022-10-26 NOTE — Progress Notes (Signed)
   NAME:  Ronnie [name], MRN:  [id number], DOB:  1975-08-25, LOS: 1 ADMISSION DATE:  10/25/2022, CONSULTATION DATE:  10/25/2022 REFERRING MD:  Dr Renaye Rakers, CHIEF COMPLAINT: Cough, congestion  History of Present Illness:  Patient came into the hospital today with shortness of breath, cough, congestion, wheezing  This started following a recent trip from [place] state  Has had pneumonias in the past but the last one was about 2 years ago History of muscular dystrophy with progressive limitations Needs assistance with all activities Able to cough but not able to clear secretions effectively Has not been around any sick contacts recently No fevers, no chills Active smoker  Has not been on any antibiotics recently  He is on pain medications He reported that he was tried on steroids previously that did not help his muscular dystrophy   Pertinent  Medical History   Past Medical History:  Diagnosis Date   Hypertension    Muscular dystrophy (HCC)    Substance abuse (HCC)      Significant Hospital Events: Including procedures, antibiotic start and stop dates in addition to other pertinent events   10/25/2022-CT scan of the chest was reviewed showing consolidation at the right base and mucus in the airway on the right side mostly  Interim History / Subjective:   Feels somewhat better.  Anxious about situation.  Objective   Blood pressure (!) 128/93, pulse (!) 117, temperature 99.6 F (37.6 C), temperature source Oral, resp. rate 15, height 5\' 11"  (1.803 m), weight 63.5 kg, SpO2 98 %.        Intake/Output Summary (Last 24 hours) at 10/26/2022 0754 Last data filed at 10/26/2022 0600 Gross per 24 hour  Intake 1827.22 ml  Output --  Net 1827.22 ml   Filed Weights   10/25/22 1931 10/26/22 0000 10/26/22 0500  Weight: 70.3 kg 63.5 kg 63.5 kg    Examination: General: Middle-aged male, resting comfortably in bed HENT: Mucous membranes moist Lungs: Bilateral rhonchi Cardiovascular:  Regular rate rhythm, S1-S2 Abdomen: Soft, nontender nondistended Extremities: No significant edema, thin extremities Neuro: Cannot weakness bilaterally  Last echocardiogram 06/22/2019 showing an ejection fraction of 35-40%, mildly dilated left ventricular internal cavity, normal function on the right  Resolved Hospital Problem list     Assessment & Plan:   Right lower lobe pneumonia Muscular dystrophy, chronic neuromuscular respiratory failure History of hypertension Nonischemic cardiomyopathy History of sleep apnea Active smoker Past history of substance abuse Severe muscle weakness from his muscular dystrophy affecting his ability to clear secretions  Plan: No indication for bronchoscopy Continue pulmonary toileting with I-S, flutter and chest PT. Continue scheduled nebs Continue Rocephin and azithromycin for treatment of pneumonia MRSA PCR pending Palliative care consultation placed.  I would not recommend mechanical support for this patient due to his underlying neuromuscular disease.  He would end up likely tracheostomy tube dependent and ventilator dependent.  I had a long discussion with the patient regarding this today.  He is in agreement would not want to proceed with that if possible. He would like to be a DNR and continue current level of care.  If he continues to decline he is open to the consideration for transition to comfort care because he "I know that I am dying"  Patient stable for pickup by family practice teaching service tomorrow. I communicated with Dr. Purvis Kilts, DO [facility] 10/26/2022 9:42 AM

## 2022-10-27 DIAGNOSIS — Z515 Encounter for palliative care: Secondary | ICD-10-CM

## 2022-10-27 DIAGNOSIS — G7100 Muscular dystrophy, unspecified: Secondary | ICD-10-CM | POA: Insufficient documentation

## 2022-10-27 DIAGNOSIS — J181 Lobar pneumonia, unspecified organism: Secondary | ICD-10-CM | POA: Diagnosis not present

## 2022-10-27 DIAGNOSIS — I5022 Chronic systolic (congestive) heart failure: Secondary | ICD-10-CM | POA: Insufficient documentation

## 2022-10-27 DIAGNOSIS — Z7189 Other specified counseling: Secondary | ICD-10-CM

## 2022-10-27 DIAGNOSIS — R1310 Dysphagia, unspecified: Secondary | ICD-10-CM

## 2022-10-27 LAB — BASIC METABOLIC PANEL
Anion gap: 14 (ref 5–15)
BUN: 7 mg/dL (ref 6–20)
CO2: 20 mmol/L — ABNORMAL LOW (ref 22–32)
Calcium: 8.8 mg/dL — ABNORMAL LOW (ref 8.9–10.3)
Chloride: 104 mmol/L (ref 98–111)
Creatinine, Ser: 0.69 mg/dL (ref 0.61–1.24)
GFR, Estimated: >60 mL/min (ref >60)
Glucose, Bld: 93 mg/dL (ref 70–99)
Potassium: 3.7 mmol/L (ref 3.5–5.1)
Sodium: 138 mmol/L (ref 135–145)

## 2022-10-27 LAB — CBC
HCT: 36.6 % — ABNORMAL LOW (ref 39.0–52.0)
Hemoglobin: 11.6 g/dL — ABNORMAL LOW (ref 13.0–17.0)
MCH: 30 pg (ref 26.0–34.0)
MCHC: 31.7 g/dL (ref 30.0–36.0)
MCV: 94.6 fL (ref 80.0–100.0)
Platelets: 275 10*3/uL (ref 150–400)
RBC: 3.87 MIL/uL — ABNORMAL LOW (ref 4.22–5.81)
RDW: 14.4 % (ref 11.5–15.5)
WBC: 13 10*3/uL — ABNORMAL HIGH (ref 4.0–10.5)
nRBC: 0 % (ref 0.0–0.2)

## 2022-10-27 LAB — MAGNESIUM: Magnesium: 1.7 mg/dL (ref 1.7–2.4)

## 2022-10-27 LAB — GLUCOSE, CAPILLARY: Glucose-Capillary: 99 mg/dL (ref 70–99)

## 2022-10-27 LAB — CULTURE, BLOOD (ROUTINE X 2): Culture: NO GROWTH

## 2022-10-27 MED ORDER — LORAZEPAM 2 MG/ML IJ SOLN
0.5000 mg | INTRAMUSCULAR | Status: DC | PRN
  Administered 2022-10-29: 0.5 mg via INTRAVENOUS
  Filled 2022-10-27: qty 1

## 2022-10-27 MED ORDER — GLYCOPYRROLATE 0.2 MG/ML IJ SOLN
0.4000 mg | Freq: Three times a day (TID) | INTRAMUSCULAR | Status: DC
  Administered 2022-10-27 – 2022-10-30 (×10): 0.4 mg via INTRAVENOUS
  Filled 2022-10-27 (×10): qty 2

## 2022-10-27 MED ORDER — FENTANYL CITRATE PF 50 MCG/ML IJ SOSY: 25.0000 ug | PREFILLED_SYRINGE | INTRAMUSCULAR | Status: DC | PRN

## 2022-10-27 MED ORDER — MORPHINE SULFATE (CONCENTRATE) 10 MG/0.5ML PO SOLN: 5.0000 mg | ORAL | Status: DC | PRN

## 2022-10-27 MED ORDER — LORAZEPAM 0.5 MG PO TABS: 0.5000 mg | ORAL_TABLET | ORAL | Status: DC | PRN

## 2022-10-27 MED ORDER — MORPHINE SULFATE (CONCENTRATE) 10 MG/0.5ML PO SOLN
5.0000 mg | ORAL | Status: DC
  Filled 2022-10-27: qty 0.5

## 2022-10-27 MED ORDER — MORPHINE SULFATE (PF) 2 MG/ML IV SOLN
2.0000 mg | INTRAVENOUS | Status: DC | PRN
  Administered 2022-10-27 – 2022-10-30 (×4): 2 mg via INTRAVENOUS
  Filled 2022-10-27 (×4): qty 1

## 2022-10-27 MED ORDER — FENTANYL BOLUS VIA INFUSION: 25.0000 ug | INTRAVENOUS | Status: DC | PRN

## 2022-10-27 MED ORDER — FENTANYL BOLUS VIA INFUSION
50.0000 ug | INTRAVENOUS | Status: DC | PRN
  Administered 2022-10-27 – 2022-10-29 (×8): 50 ug via INTRAVENOUS

## 2022-10-27 MED ORDER — MORPHINE SULFATE (PF) 2 MG/ML IV SOLN: 2.0000 mg | INTRAVENOUS | Status: DC | PRN

## 2022-10-27 MED ORDER — FENTANYL 2500MCG IN NS 250ML (10MCG/ML) PREMIX INFUSION
50.0000 ug/h | INTRAVENOUS | Status: DC
  Administered 2022-10-27 – 2022-10-29 (×3): 100 ug/h via INTRAVENOUS
  Filled 2022-10-27 (×3): qty 250

## 2022-10-27 MED ORDER — SODIUM CHLORIDE 0.9 % IV SOLN
3.0000 g | Freq: Once | INTRAVENOUS | Status: AC
  Administered 2022-10-27: 3 g via INTRAVENOUS
  Filled 2022-10-27: qty 8

## 2022-10-27 MED ORDER — FENTANYL CITRATE PF 50 MCG/ML IJ SOSY
75.0000 ug | PREFILLED_SYRINGE | INTRAMUSCULAR | Status: DC | PRN
  Administered 2022-10-27: 75 ug via INTRAVENOUS
  Filled 2022-10-27 (×2): qty 2

## 2022-10-27 MED ORDER — GLYCOPYRROLATE 0.2 MG/ML IJ SOLN
0.4000 mg | INTRAMUSCULAR | Status: DC
  Administered 2022-10-27: 0.4 mg via INTRAVENOUS
  Filled 2022-10-27: qty 2

## 2022-10-27 MED ORDER — SODIUM CHLORIDE 0.9 % IV SOLN
500.0000 mg | INTRAVENOUS | Status: DC
  Administered 2022-10-27 – 2022-10-28 (×2): 500 mg via INTRAVENOUS
  Filled 2022-10-27 (×2): qty 5

## 2022-10-27 MED ORDER — ACETYLCYSTEINE 20 % IN SOLN
2.0000 mL | Freq: Three times a day (TID) | RESPIRATORY_TRACT | Status: AC
  Administered 2022-10-27 – 2022-10-29 (×5): 2 mL via RESPIRATORY_TRACT
  Filled 2022-10-27 (×6): qty 4

## 2022-10-27 MED ORDER — ENOXAPARIN SODIUM 40 MG/0.4ML IJ SOSY: 40.0000 mg | PREFILLED_SYRINGE | INTRAMUSCULAR | Status: DC

## 2022-10-27 NOTE — Progress Notes (Signed)
Pt with significant work of breathing. HR ST 110s, respirations 40s-50s and patient diaphoretic. Oxygen saturation dropped from 90s to low 80s. Pt placed on NRB. Diminished breath sounds on the right, coarse ronchi on the left. Pt removed NRB and oxygen saturations dropped to low 70s and HR dropped to 50s. This RN encouraged patient to wear the oxygen and explained that he would die without it. Pt oriented and continued to refuse oxygen. This RN called significant other [name] and notified her of the patients current status. This RN asked if [name] could come in because patient was actively dying. She asked "why do you say that?" I explained that the patient cannot breath and is refusing to wear oxygen and that his vital signs were suggesting that his body would not tolerate that for an extended period of time. This RN explained that the respiratory distress the patient is in will cause his heart to stop and the patient told they physician he did not want mechanical ventilation or CPR done. [name] stated "I will try to get there". This RN held the conversation with significant other in the patients room so he could be aware of communication. After hanging up RN further explained to patient his condition and prognosis.RN asked pt if he understood he was dying. The patient stated that he did understand.  After lengthy discussion patient agreed to place NRB back on and stated "I want to see my wife". After NRB placed back on, HR improved to ST and oxygen sats improved to low 90s. Pt work of breathing still increased and patient remains diaphoretic. Pt expresses understanding of importance of keeping oxygen on.

## 2022-10-27 NOTE — Assessment & Plan Note (Addendum)
Comfort care orders per palliative care. -Fentanyl gtt -Robinul -Ativan prn 

## 2022-10-27 NOTE — Assessment & Plan Note (Addendum)
ECHO EF 35-40%. Previously 15%. -Stop Carvedilol, comfort care

## 2022-10-27 NOTE — Progress Notes (Signed)
   10/27/22 1209  Spiritual Encounters  Type of Visit Initial  Care provided to: Pt and family  Conversation partners present during encounter Nurse  Referral source IDT Rounds  Reason for visit Urgent spiritual support  OnCall Visit No   Chaplain was on IDT rounds with staff when PT was referred.  Chaplain entered the room and PT's fiance was on the Phone, face timing with PT's sister in [place]. I was asked to pray while she was still on the phone, so I proved prayer with the family.  Fiance was on and off the phone and in and out of the room speaking with nurses and others.  Chaplain provided compassionate presence to PT and also spoke with PT's "step[-daughter" trying encourage her in her anticipatory grief.  RN mentioned that PT was looking to have a HCPOA prepared and I went to get the paperwork, by the time I returned a NP was already there (possibly from palliative care) helpng them with that.  Chaplain will continue to follow this PT and family.

## 2022-10-27 NOTE — Assessment & Plan Note (Addendum)
-  Diet as preferred, comfort measures

## 2022-10-27 NOTE — Progress Notes (Signed)
Daily Progress Note Intern Pager: [phone]  Patient name: Ronnie [name] Medical record number: [id number] Date of birth: 07-14-1975 Age: 47 y.o. Gender: male  Primary Care Provider: Claiborne Rigg, NP Consultants: CCM Code Status: DNR  Pt Overview and Major Events to Date:  -Admitted 06/01 -Palliative Consult, Transfer to floor 06/03  Assessment and Plan:  Ronnie [name] is a 47 y.o. admitted for community acquire pneumonia. Pertinent PMH/PSH includes muscular dystrophy, HTN.   Please refer to palliative note documenting comfort care plan with limited interventions.  Active Hospital Problems   *Lobar pneumonia, unspecified organism [facility])           Per family discussion with palliative care patient           is now comfort care with continued limited           interventions below.           -Azithromycin, Unasyn (06/01- )           -Duonebs           -Mucomyst           -HFNC               Muscular dystrophy (HCC)           Comfort care orders per palliative care.           -Fentanyl gtt           -Robinul           -Ativan prn    Chronic systolic heart failure (HCC)           ECHO EF 35-40%. Previously 15%.           -Stop Carvedilol, comfort care    Dysphagia           -Diet as preferred, comfort measures   Resolved Hospital Problems No resolved problems to display.     FEN/GI: NPO PPx: Lovenox Dispo:Pending clinical improvement  Subjective:  Patient took off oxygen this am and desaturated to 70s with corresponding decreased HR. Improved with putting oxygen back on.   Objective: Temp:  [97.7 F (36.5 C)-99.3 F (37.4 C)] 97.7 F (36.5 C) (06/03 0800) Pulse Rate:  [69-134] 131 (06/03 1200) Resp:  [17-47] 29 (06/03 1205) BP: (108-161)/(66-125) 123/108 (06/03 1205) SpO2:  [76 %-100 %] 99 % (06/03 1200) Weight:  [65.8 kg] 65.8 kg (06/03 0500) Physical Exam: General: toxic, ill appearing Cardiovascular: RRR, no murmurs, no peripheral  edema Respiratory: tachypneic, diffuse rhonchi, retractions present, severely increase work of breathing on 4L HFNC Abdomen: soft, NTTP, no rebound or guarding Extremities: Moving all 4 extremities equally   Laboratory: Most recent CBC Lab Results  Component Value Date   WBC 13.0 (H) 10/27/2022   HGB 11.6 (L) 10/27/2022   HCT 36.6 (L) 10/27/2022   MCV 94.6 10/27/2022   PLT 275 10/27/2022   Most recent BMP    Latest Ref Rng & Units 10/27/2022    4:22 AM  BMP  Glucose 70 - 99 mg/dL 93   BUN 6 - 20 mg/dL 7   Creatinine 1.47 - 8.29 mg/dL 5.62   Sodium 130 - 865 mmol/L 138   Potassium 3.5 - 5.1 mmol/L 3.7   Chloride 98 - 111 mmol/L 104   CO2 22 - 32 mmol/L 20   Calcium 8.9 - 10.3 mg/dL 8.8     Mag 1.7  Imaging/Diagnostic Tests:   ECHO 10/27/22 1. Left ventricular ejection  fraction, by estimation, is 35 to 40%. The  left ventricle has moderately decreased function. The left ventricle  demonstrates global hypokinesis. Left ventricular diastolic parameters are  indeterminate.   2. Right ventricular systolic function is normal. The right ventricular  size is normal. Tricuspid regurgitation signal is inadequate for assessing  PA pressure.   3. The mitral valve is normal in structure. Mild mitral valve  regurgitation. No evidence of mitral stenosis.   4. The aortic valve is grossly normal. Aortic valve regurgitation is not  visualized. No aortic stenosis is present.   5. The inferior vena cava is normal in size with greater than 50%  respiratory variability, suggesting right atrial pressure of 3 mmHg.   Celine Mans, MD 10/27/2022, 12:17 PM  PGY-1, [facility] Medicine FPTS Intern pager: [phone], text pages welcome Secure chat group [facility] Teaching Service

## 2022-10-27 NOTE — Progress Notes (Signed)
Pt alert and oriented and confirmed that his significant other, Marval Regal is his point of contact and his decision maker should he become incapacitated.

## 2022-10-27 NOTE — Progress Notes (Signed)
FMTS Interim Progress Note  S: Patient endorses significant discomfort while trying to breathe.  He notes that his breathing is worse than it was yesterday. During this encounter, I spoke with RN who notes he appears further disoriented from previous.  She is attempting to contact palliative to expedite goals of care discussion. Of note, patient was unable to perform NIF but did tolerate breathing treatment and chest PT.  O: BP (!) 161/125   Pulse (!) 101   Temp 97.7 F (36.5 C)   Resp (!) 39   Ht 5\' 11"  (1.803 m)   Wt 65.8 kg   SpO2 (!) 82%   BMI 20.23 kg/m   General: Acutely distressed, uncomfortable appearing, diaphoretic CV: Unable to auscultate over pulmonary findings Pulm: Coarse breath sounds, significant crackles, increased WOB  A/P: Right lower lobe pneumonia, chronic neuromuscular respiratory failure His clinical picture is very concerning and I do not believe that he is appropriate for progressive floor.  After speaking with Dr. Tonia Brooms, there is not much that can be offered in terms of assisting respiratory support.  There has been lengthy conversation between patient and Dr. Tonia Brooms (see previous notation) patient is not interested in mechanical life support or tracheostomy tube placement and further does not want chest compressions or shocks, patient is currently DNR. -Continue current management (scheduled nebs, chest PT, antibiotics) -I do expect that comfort care is the next step  Shelby Mattocks, DO 10/27/2022, 9:36 AM PGY-2, [facility] Family Medicine Service pager [phone]

## 2022-10-27 NOTE — Assessment & Plan Note (Addendum)
Per family discussion with palliative care patient is now comfort care with continued limited interventions below. -Azithromycin, Unasyn (06/01- ) -Duonebs -Mucomyst -HFNC

## 2022-10-27 NOTE — Progress Notes (Signed)
Pt unable to perform NIF at this time due to increased WOB. Pt tolerated breathing treatment and Chest PT well.

## 2022-10-27 NOTE — Consult Note (Signed)
Consultation Note Date: 10/27/2022   Patient Name: Ronnie [name]  DOB: 12/15/1975  MRN: [id number]  Age / Sex: 47 y.o., male  PCP: Claiborne Rigg, NP Referring Physician: Latrelle Dodrill, MD  Reason for Consultation: Establishing goals of care and Non pain symptom management  HPI/Patient Profile: 47 y.o. male  with past medical history of muscular dystrophy, hypertension, substance use admitted on 10/25/2022 with shortness of breath, cough, congestion, wheezing related to RLL pneumonia in the setting of progressing muscular dystrophy.   Clinical Assessment and Goals of Care: Consult received and chart review completed. Noted visits in Care Everywhere with progressing muscular dystrophy with plans for power wheelchair, BiPAP, and need for increased caregiver support. I met at bedside with Ronnie [name] joined by his significant other [name]. [name] shares that they came from [place] to [facility] Wednesday with plans to move to [place] as she has family support in the area that could assist in caring for Ronnie [name] and give them a better support system. They have been together 14 years. She was en-route to try and obtain marriage license this morning when she was called to come to the hospital.   I discussed further with Ronnie [name] and [name] goals of care. We discussed his pneumonia in the setting of his muscular dystrophy. We discussed that he is worsening despite maximal treatment with antibiotics and interventions recommended by the medical and pulmonary/critical care team. I expressed my concern that he is nearing end of life. At this time Ronnie [name] is requesting medication to provide him with relief and comfort. [name] agrees telling me that she does not want to suffer.   Ronnie [name] went into acute distress and is diaphoretic and IV lost. Spent time working with nursing and pharmacy to manage medications to get him comfortable. I called [name] and  updated as she had just left the bedside. I discussed with them beginning opioid infusion to achieve comfort and symptom relief. Ronnie [name] is refusing oxygen at this time and understands that his prognosis is poor.   I met again with [name] at the bedside. She has his sister, mother, and family on FaceTime with Ronnie [name]. Derren's symptoms are much improved on Fentanyl infusion and with Ronnie [name]. Family had many questions and concerns about care options and plan. I explained that [name] was in acute distress despite treatment for his pneumonia and explained progressing muscular dystrophy and poor prognosis. Family are pleading with [name] "to fight." I explained that [name] is doing all that his body will allow him to do. We agreed to continue conservative treatment with antibiotics, oxygen, nebs, labs at this time. We will continue fentanyl infusion and medications for comfort as needed as this has helped him more than anything. NO plans for escalation of care if he further declines but for comfort measures. I verified with Ronnie [name] and [name] plans for comfort focus with symptom relief and no escalation.   I also assisted with HCPOA education.   All questions/concerns addressed to the best of my ability. Emotional support provided.   Primary Decision  Maker PATIENT (wife [name] is HCPOA)    SUMMARY OF RECOMMENDATIONS   - DNR confirmed - Comfort and symptom relief is PRIORITY - Continue conservative measures (antibiotics, nebs, oxygen) - No escalation of care with further health decline  Code Status/Advance Care Planning: DNR   Symptom Management:  Fentanyl infusion for shortness of breath. Titrate and bolus as needed to achieve symptom relief.  Ronnie [name] scheduled for secretion management.   Psycho-social/Spiritual:  Desire for further Chaplaincy support:yes Additional Recommendations: Grief/Bereavement Support  Prognosis:  Prognosis poor with severe pneumonia, concern for ongoing aspiration, in  the setting of progressing muscular dystrophy.   Discharge Planning: To Be Determined      Primary Diagnoses: Present on Admission:  Lobar pneumonia, unspecified organism (HCC)   I have reviewed the medical record, interviewed the patient and family, and examined the patient. The following aspects are pertinent.  Past Medical History:  Diagnosis Date   Hypertension    Muscular dystrophy (HCC)    Substance abuse (HCC)    Social History   Socioeconomic History   Marital status: Significant Other    Spouse name: Not on file   Number of children: 1   Years of education: 20   Highest education level: Not on file  Occupational History   Occupation: Skilled Laborer  Tobacco Use   Smoking status: Every Day    Packs/day: 0.50    Years: 25.00    Additional pack years: 0.00    Total pack years: 12.50    Types: Cigarettes   Smokeless tobacco: Never  Vaping Use   Vaping Use: Never used  Substance and Sexual Activity   Alcohol use: Yes    Alcohol/week: 6.0 standard drinks of alcohol    Types: 6 Cans of beer per week    Comment: occ   Drug use: Not Currently    Types: Cocaine   Sexual activity: Yes    Partners: Female    Birth control/protection: None  Other Topics Concern   Not on file  Social History Narrative   Fun/Hobby: Fishing    Patient lives with girlfriend. Alcohol drinker and cocaine user.    Social Determinants of Health   Financial Resource Strain: Not on file  Food Insecurity: Not on file  Transportation Needs: Not on file  Physical Activity: Not on file  Stress: Not on file  Social Connections: Not on file   Family History  Problem Relation Age of Onset   Heart disease Mother    Heart disease Maternal Grandmother    Scheduled Meds:  acetylcysteine  2 mL Nebulization Q8H   carvedilol  12.5 mg Oral BID WC   Chlorhexidine Gluconate Cloth  6 each Topical Daily   enoxaparin (LOVENOX) injection  40 mg Subcutaneous Q24H   ketorolac  7.5 mg Intravenous  Q8H   labetalol  5 mg Intravenous Q4H   lidocaine  1 patch Transdermal Q24H   pregabalin  150 mg Oral BID   Continuous Infusions:  azithromycin Stopped (10/26/22 2237)   cefTRIAXone (ROCEPHIN)  IV 2 g (10/27/22 0526)   lactated ringers 75 mL/hr at 10/27/22 0800   PRN Meds:.docusate sodium, fentaNYL (SUBLIMAZE) injection **OR** fentaNYL (SUBLIMAZE) injection, ipratropium-albuterol, ketorolac, oxyCODONE, polyethylene glycol Allergies  Allergen Reactions   Lisinopril Cough   Review of Systems  Constitutional:  Positive for activity change, appetite change, diaphoresis and fatigue.  Respiratory:  Positive for shortness of breath.   Neurological:  Positive for weakness.    Physical Exam Vitals and nursing note reviewed.  Constitutional:      General: He is in acute distress.     Appearance: He is ill-appearing.  Cardiovascular:     Comments: HR mostly tachy but occasionally brady Abdominal:     General: Abdomen is flat.  Neurological:     Mental Status: He is alert and oriented to person, place, and time.     Vital Signs: BP (!) 142/104 (BP Location: Left Arm)   Pulse 99   Temp 97.7 F (36.5 C)   Resp (!) 43 Comment: MD aware  Ht 5\' 11"  (1.803 m)   Wt 65.8 kg   SpO2 92%   BMI 20.23 kg/m  Pain Scale: 0-10   Pain Score: 10-Worst pain ever   SpO2: SpO2: 92 % O2 Device:SpO2: 92 % O2 Flow Rate: .O2 Flow Rate (L/min): 4 L/min  IO: Intake/output summary:  Intake/Output Summary (Last 24 hours) at 10/27/2022 0915 Last data filed at 10/27/2022 0800 Gross per 24 hour  Intake 1740.03 ml  Output 700 ml  Net 1040.03 ml    LBM: Last BM Date :  (patient unsure) Baseline Weight: Weight: 70.3 kg Most recent weight: Weight: 65.8 kg     Palliative Assessment/Data:     Time In: 0910  Time Total: 150 min  Greater than 50%  of this time was spent counseling and coordinating care related to the above assessment and plan.  Signed by: Yong Channel, NP Palliative Medicine  Team Pager # [phone] (M-F 8a-5p) Team Phone # [phone] (Nights/Weekends)

## 2022-10-27 NOTE — Progress Notes (Signed)
   10/27/22 1436  Spiritual Encounters  Type of Visit Follow up  Care provided to: Pt and family  Conversation partners present during encounter Nurse  Referral source Patient request  Reason for visit Advance directives  OnCall Visit No   Chaplain returned to the room at the request of the PT. PT was finished filling out HCPOA and was ready to have it notarized.  Chaplain facilitated the signing of the document and filed the appropriate papers in the necessary places. Chaplain was also asked about obtaining a notary for 2 papers that would allow the PT to apply for a marriage license without having to be physically present at the court house.  Chaplain obtained a notary and facilitated the papers be signed. Chaplain is being asked to perform a marriage ceremony when [employer] returns with license.

## 2022-10-28 DIAGNOSIS — G7100 Muscular dystrophy, unspecified: Secondary | ICD-10-CM | POA: Diagnosis not present

## 2022-10-28 DIAGNOSIS — Z7189 Other specified counseling: Secondary | ICD-10-CM | POA: Diagnosis not present

## 2022-10-28 DIAGNOSIS — Z515 Encounter for palliative care: Secondary | ICD-10-CM | POA: Diagnosis not present

## 2022-10-28 DIAGNOSIS — J181 Lobar pneumonia, unspecified organism: Secondary | ICD-10-CM | POA: Diagnosis not present

## 2022-10-28 LAB — LEGIONELLA PNEUMOPHILA SEROGP 1 UR AG: L. pneumophila Serogp 1 Ur Ag: NEGATIVE

## 2022-10-28 LAB — CULTURE, BLOOD (ROUTINE X 2)

## 2022-10-28 LAB — GLUCOSE, CAPILLARY: Glucose-Capillary: 89 mg/dL (ref 70–99)

## 2022-10-28 NOTE — Progress Notes (Signed)
   10/28/22 1420  Spiritual Encounters  Type of Visit Follow up   Chaplain stopped in to check on PT after the big wedding day yesterday.  PT was in and out of sleep and I did not want to disturb him much.  I simply told him I was checking in on him and would return another time to see how he is.

## 2022-10-28 NOTE — Assessment & Plan Note (Signed)
Comfort care orders per palliative care. -Fentanyl gtt -Robinul -Ativan prn

## 2022-10-28 NOTE — Assessment & Plan Note (Signed)
ECHO EF 35-40%. Previously 15%. -Comfort care

## 2022-10-28 NOTE — Progress Notes (Signed)
NIF -24 Good effort

## 2022-10-28 NOTE — Progress Notes (Signed)
Daily Progress Note Intern Pager: [phone]  Patient name: Ronnie [name] Medical record number: [id number] Date of birth: 11-17-1975 Age: 47 y.o. Gender: male  Primary Care Provider: Claiborne Rigg, NP Consultants: Palliative Care, CCM Code Status: DNR  Pt Overview and Major Events to Date:  -Admitted 06/01 -Palliative Consult, Transfer to floor 06/03  Assessment and Plan:  Ronnie [name] is a 47 y.o. admitted for community acquire pneumonia in setting of advanced muscular dystrophy. Pertinent PMH/PSH includes muscular dystrophy, HTN.    Active Hospital Problems   *Lobar pneumonia, unspecified organism [facility])           Per family discussion with palliative care patient           is now comfort care with continued limited           interventions below.           -Azithromycin           -Duonebs           -Mucomyst           -Respiratory support, but no escalation past           Bipap.    Muscular dystrophy (HCC)           Comfort care orders per palliative care.           -Fentanyl gtt           -Robinul           -Ativan prn    Chronic systolic heart failure (HCC)           ECHO EF 35-40%. Previously 15%.           -Comfort care    Dysphagia           Diet as preferred.   Resolved Hospital Problems No resolved problems to display.  HTN - Stop Labetalol.   FEN/GI: Comfort PPx: None Dispo: Pending clinical improvement  Subjective:  NAEO. Married yesterday! Breathing is improved after palliative interventions. Not in pain.  Objective: Temp:  [98.2 F (36.8 C)-99.5 F (37.5 C)] 98.7 F (37.1 C) (06/04 1551) Pulse Rate:  [89-123] 89 (06/04 1551) Resp:  [20-35] 21 (06/04 1551) BP: (112-145)/(77-118) 112/77 (06/04 1200) SpO2:  [100 %] 100 % (06/04 1551) Weight:  [65.1 kg] 65.1 kg (06/04 0500) Physical Exam: General: NAD, chronically ill appearing, resting comfortably in hospital bed Cardiovascular: RRR, no murmurs, no peripheral edema, radial pulse  right 2+ Respiratory: normal WOB on 2L LFNC, intermittent crackles Extremities: Moving all 4 extremities equally   Laboratory: Most recent CBC Lab Results  Component Value Date   WBC 13.0 (H) 10/27/2022   HGB 11.6 (L) 10/27/2022   HCT 36.6 (L) 10/27/2022   MCV 94.6 10/27/2022   PLT 275 10/27/2022   Most recent BMP    Latest Ref Rng & Units 10/27/2022    4:22 AM  BMP  Glucose 70 - 99 mg/dL 93   BUN 6 - 20 mg/dL 7   Creatinine 1.47 - 8.29 mg/dL 5.62   Sodium 130 - 865 mmol/L 138   Potassium 3.5 - 5.1 mmol/L 3.7   Chloride 98 - 111 mmol/L 104   CO2 22 - 32 mmol/L 20   Calcium 8.9 - 10.3 mg/dL 8.8     None  Imaging/Diagnostic Tests: None  Celine Mans, MD 10/28/2022, 3:53 PM  PGY-1, [facility] FPTS Intern pager: [phone], text pages welcome Secure chat group  [facility] Teaching Service

## 2022-10-28 NOTE — Progress Notes (Signed)
NIF -40 Good pt effort

## 2022-10-28 NOTE — Progress Notes (Signed)
NIF -22 with best effort.

## 2022-10-28 NOTE — Assessment & Plan Note (Signed)
Diet as preferred.

## 2022-10-28 NOTE — Progress Notes (Addendum)
Palliative:  HPI: 47 y.o. male  with past medical history of muscular dystrophy, hypertension, substance use admitted on 10/25/2022 with shortness of breath, cough, congestion, wheezing related to RLL pneumonia in the setting of progressing muscular dystrophy.   I met today with Ronnie [name]. No visitors at bedside. I checked in earlier but did not awaken him. He awakens to voice easily today. He has no distress or complaints. He tells me that he feels much better today. I spent some time discussing with him that he feels better because of the fentanyl which is providing him relief from his symptoms so he is not working hard to breathe. I did explain that I am concerned that the we may still not be able to treat the problem - the pneumonia due to his weakness from his muscular dystrophy. He is clear in his desire to continue antibiotics and conservative treatment. He does struggle to understand that just because he is feeling better does not necessarily mean that he is improving and getting better.   I called and spoke with wife, Ronnie [name]. I explained the above with her as well. I explained that Ronnie [name] has had a restful and relaxing day. He has not had any distress or symptoms as he had yesterday. I explained to Ronnie [name] how the medication is helping and she understands. She continues to be hopeful for improvement but is happy that he is feeling better. We agree to continue current plan and time for outcomes. They continue to pray and hope for healing. Priority is that he does not suffer.   All questions/concerns addressed. Emotional support provided. Updated and discussed with Dr. Velna Ochs.   Exam: Sleeping peacefully. Awakens easily. Oriented. No distress. Breathing regular, shallow on 10L nasal cannula. Abd flat. Thin, frail, weakness.   Plan: - DNR - Continue conservative treatment/interventions - Continue fentanyl infusion for comfort - Comfort is decline further  50 min  Yong Channel, NP Palliative  Medicine Team Pager [phone] (Please see [email or web address] for schedule) Team Phone [phone]    Greater than 50%  of this time was spent counseling and coordinating care related to the above assessment and plan

## 2022-10-28 NOTE — Assessment & Plan Note (Signed)
Per family discussion with palliative care patient is now comfort care with continued limited interventions below. -Azithromycin -Duonebs -Mucomyst -Respiratory support, but no escalation past Bipap.

## 2022-10-29 ENCOUNTER — Inpatient Hospital Stay (HOSPITAL_COMMUNITY): Payer: Medicare HMO

## 2022-10-29 DIAGNOSIS — Z7189 Other specified counseling: Secondary | ICD-10-CM | POA: Diagnosis not present

## 2022-10-29 DIAGNOSIS — G7100 Muscular dystrophy, unspecified: Secondary | ICD-10-CM | POA: Diagnosis not present

## 2022-10-29 DIAGNOSIS — Z515 Encounter for palliative care: Secondary | ICD-10-CM | POA: Diagnosis not present

## 2022-10-29 DIAGNOSIS — J181 Lobar pneumonia, unspecified organism: Secondary | ICD-10-CM | POA: Diagnosis not present

## 2022-10-29 LAB — CULTURE, BLOOD (ROUTINE X 2)

## 2022-10-29 MED ORDER — FENTANYL BOLUS VIA INFUSION: 50.0000 ug | INTRAVENOUS | Status: DC | PRN

## 2022-10-29 MED ORDER — OXYCODONE HCL 20 MG/ML PO CONC
15.0000 mg | ORAL | Status: DC | PRN
  Administered 2022-10-29 – 2022-10-30 (×2): 15 mg via SUBLINGUAL
  Filled 2022-10-29 (×2): qty 1

## 2022-10-29 MED ORDER — ENOXAPARIN SODIUM 40 MG/0.4ML IJ SOSY
40.0000 mg | PREFILLED_SYRINGE | INTRAMUSCULAR | Status: DC
  Administered 2022-10-29 – 2022-10-31 (×3): 40 mg via SUBCUTANEOUS
  Filled 2022-10-29 (×3): qty 0.4

## 2022-10-29 NOTE — Progress Notes (Signed)
Paged by RN because family wanted to discuss potential for feeding tube.  It sounds like he has not attempted to eat much in the last 5 days although he has tried Ensure drinks and some fluids.  I have advised them that feeding tube may cause further difficulties and his prognosis such as further aspiration risk during placement and if he is already aspirating, then placement of a feeding tube would not prolong his life.  They further question the potential of G-tube, to which I explained that as pulmonology stated, he would not do well with intubation and surgery would likely not be a possible solution.  We came to the agreement that patient will try to eat with his current dysphagia diet.  We will readdress the matter in the morning and if we need to consult speech therapy for swallow eval, we can do that and discuss further.  I have asked RN to assist with ordering food for the night.  They were appreciative of conversation.  They also asked about chest x-ray.  However, the official read from radiology is not yet back.

## 2022-10-29 NOTE — Assessment & Plan Note (Signed)
Diet as preferred. Dysphagia diet.

## 2022-10-29 NOTE — Progress Notes (Addendum)
Wasted 215cc of 250cc of fentanyl wasted in the stericycle with Ruben Gottron, RN as witness.    Christain Sacramento, RN

## 2022-10-29 NOTE — Progress Notes (Signed)
     Daily Progress Note Intern Pager: [phone]  Patient name: Ronnie [name] Medical record number: [id number] Date of birth: Oct 23, 1975 Age: 47 y.o. Gender: male  Primary Care Provider: Claiborne Rigg, NP Consultants: Palliative Care, CCM  Code Status: DNR  Pt Overview and Major Events to Date:  -Admitted 06/01 -Palliative Consult, Transfer to floor 06/03  Assessment and Plan: Ronnie [name] is a 47 y.o. admitted for community acquire pneumonia in setting of advanced muscular dystrophy. Pertinent PMH/PSH includes muscular dystrophy, HTN.   Active Hospital Problems   *Lobar pneumonia, unspecified organism [facility])           Per family discussion with palliative care patient           is now comfort care with continued limited           interventions below.           -Azithromycin           -Duonebs           -Mucomyst           -Respiratory support, but no escalation past Bipap    Muscular dystrophy (HCC)           Comfort care orders per palliative care.           -Fentanyl gtt           -Robinul           -Ativan pr    Chronic systolic heart failure (HCC)           ECHO EF 35-40%. Previously 15%.           -Comfort care           -Hold Losartan    Dysphagia           Diet as preferred.Dysphagia diet.   Resolved Hospital Problems No resolved problems to display.    FEN/GI: Comfort PPx: Lovenox Dispo: Pending palliative discussion  Subjective:  NAEO. Feels breathing is doing same as yesterday. Comfortable not in pain.  Objective: Temp:  [98.7 F (37.1 C)-99.4 F (37.4 C)] 98.7 F (37.1 C) (06/04 1900) Pulse Rate:  [78-115] 87 (06/05 0800) Resp:  [8-47] 14 (06/05 0800) BP: (103-131)/(73-97) 118/86 (06/05 0800) SpO2:  [76 %-100 %] 99 % (06/05 0800) Physical Exam: General: NAD, ill appearing Cardiovascular: RRR, no murmurs, no peripheral edema Respiratory: normal WOB on HFNC, poor air movement intermittent rales Abdomen: soft, NTTP, no rebound or  guarding Extremities: Moving all 4 extremities equally   Laboratory: Most recent CBC Lab Results  Component Value Date   WBC 13.0 (H) 10/27/2022   HGB 11.6 (L) 10/27/2022   HCT 36.6 (L) 10/27/2022   MCV 94.6 10/27/2022   PLT 275 10/27/2022   Most recent BMP    Latest Ref Rng & Units 10/27/2022    4:22 AM  BMP  Glucose 70 - 99 mg/dL 93   BUN 6 - 20 mg/dL 7   Creatinine 1.30 - 8.65 mg/dL 7.84   Sodium 696 - 295 mmol/L 138   Potassium 3.5 - 5.1 mmol/L 3.7   Chloride 98 - 111 mmol/L 104   CO2 22 - 32 mmol/L 20   Calcium 8.9 - 10.3 mg/dL 8.8      Imaging/Diagnostic Tests: No new imaging.  Celine Mans, MD 10/29/2022, 8:17 AM  PGY-1, [facility] FPTS Intern pager: [phone], text pages welcome Secure chat group [facility] Teaching Service

## 2022-10-29 NOTE — Assessment & Plan Note (Addendum)
Per family discussion with palliative care patient is now comfort care with continued limited interventions below. Lung exam improved. Azithromycin stop, has had full treatment for CAP. (Likely strep pneumo given positive urinary antigen). -Azithromycin discontinued -Duonebs -Mucomyst -Respiratory support, but no escalation past Bipap

## 2022-10-29 NOTE — Progress Notes (Signed)
NIF -20 at best effort.

## 2022-10-29 NOTE — Assessment & Plan Note (Addendum)
Respiratory status stable; however, continues to require fentanyl gtt and high flow. Ongoing goals of care discussion with family and palliative. -Fentanyl gtt -Robinul -Ativan prn

## 2022-10-29 NOTE — Assessment & Plan Note (Signed)
ECHO EF 35-40%. Previously 15%. -Comfort care -Hold Losartan

## 2022-10-29 NOTE — Progress Notes (Signed)
Palliative:  HPI: 47 y.o. male  with past medical history of muscular dystrophy, hypertension, substance use admitted on 10/25/2022 with shortness of breath, cough, congestion, wheezing related to RLL pneumonia in the setting of progressing muscular dystrophy.   I visited today with Kino. I spoke with him. Then I spoke extensively with his wife, [name], and sister, Raynelle Fanning. We had a long discussion about current status and the risks vs benefits of his treatments. Family expresses concerns that he is considered comfort care and they want him to get treatment. We had a long discussion about muscular dystrophy, weak lungs, aspiration risk and dysphagia, and poor ability for him to be strong enough to improve from pneumonia. We reviewed the course of events that lead [place] to the position with acute distress and he could not breathe and was requesting relief and comfort. I explained that if we had not reacted to provide him relief his body would have given out and he would not be here today. I explained that if their goal is for him to have treatment and try and get better the fentanyl infusion is not appropriate to continue. However, I worry that if we stop the fentanyl infusion that he may have recurrent distress and shortness of breath. Family are insistent that they want treatment with hopes of improvement. They want to exhaust all options to provide him with improvement. We did discuss that if he has recurrent distress we will not have any other options but to offer comfort care. We discussed that comfort would be indicated on his wishes and family wishes that he not suffer. They do agree if he declines as before then we should provide comfort care. Family was appreciative of the time and information. I reassured them that we will take good care of Lemmie and I hear their desire to exhaust treatment options as they are hopeful for improvement. They understand that the muscular dystrophy is severe and working against  him.   I returned and reviewed with Ercel. Ahijah agrees with plan as discussed with [name] and [facility]. I discussed with RN Marisue Ivan. I called and discussed in detail with Dr. Velna Ochs. Situation complicated but goals are clear at this time.   IF he is able to successfully wean off fentanyl infusion should be okay for transfer out of ICU. Not appropriate for floor status on fentanyl infusion and no longer comfort care. Discussed with 2H nursing staff.   Exam: Sleeping but awakens with ease. VSS. No distress. Breathing regular but shallow. [place] 6L. Abd soft. Thin, frail.   Plan: - DNR  - NOT comfort care. RN to wean fentanyl infusion. Ordered home dose oxycodone 15 mg q4 prn (may have liquid/SL form with known dysphagia). - Patient and family want treatment - relayed to attending team their wishes.  - Comfort care IF acute distress/decline. He has been comfortable on fentanyl 100 mcg/hr if he declines and needs comfort care transition.   90 min  Yong Channel, NP Palliative Medicine Team Pager [phone] (Please see [email or web address] for schedule) Team Phone [phone]    Greater than 50%  of this time was spent counseling and coordinating care related to the above assessment and plan

## 2022-10-29 NOTE — TOC CM/SW Note (Signed)
Transition of Care [facility]) - Inpatient Brief Assessment   Patient Details  Name: Ronnie [name] MRN: [id number] Date of Birth: Jun 14, 1975  Transition of Care [facility]) CM/SW Contact:    Gala Lewandowsky, RN Phone Number: 10/29/2022, 1:26 PM   Clinical Narrative: Patient presented for cough and chest congestion-lobar pneumonia. Palliative care is following the patient and he is a DNR with conservative treatments for comfort care. Case Manager will continue to follow for additional transition of care needs.    Transition of Care Asessment: Insurance and Status: Insurance coverage has been reviewed Patient has primary care physician: Yes     Prior/Current Home Services: No current home services Social Determinants of Health Reivew: SDOH reviewed no interventions necessary Readmission risk has been reviewed: Yes Transition of care needs: transition of care needs identified, TOC will continue to follow

## 2022-10-30 DIAGNOSIS — G7100 Muscular dystrophy, unspecified: Secondary | ICD-10-CM | POA: Diagnosis not present

## 2022-10-30 DIAGNOSIS — Z711 Person with feared health complaint in whom no diagnosis is made: Secondary | ICD-10-CM

## 2022-10-30 DIAGNOSIS — Z7189 Other specified counseling: Secondary | ICD-10-CM | POA: Diagnosis not present

## 2022-10-30 DIAGNOSIS — Z515 Encounter for palliative care: Secondary | ICD-10-CM | POA: Diagnosis not present

## 2022-10-30 DIAGNOSIS — J181 Lobar pneumonia, unspecified organism: Secondary | ICD-10-CM | POA: Diagnosis not present

## 2022-10-30 LAB — CULTURE, BLOOD (ROUTINE X 2): Culture: NO GROWTH

## 2022-10-30 MED ORDER — GLYCOPYRROLATE 0.2 MG/ML IJ SOLN
0.4000 mg | Freq: Once | INTRAMUSCULAR | Status: AC
  Administered 2022-10-30: 0.4 mg via INTRAVENOUS
  Filled 2022-10-30: qty 2

## 2022-10-30 MED ORDER — FENTANYL BOLUS VIA INFUSION
50.0000 ug | INTRAVENOUS | Status: DC | PRN
  Administered 2022-10-31: 100 ug via INTRAVENOUS
  Administered 2022-10-31 (×2): 50 ug via INTRAVENOUS
  Administered 2022-10-31: 100 ug via INTRAVENOUS

## 2022-10-30 MED ORDER — FENTANYL 2500MCG IN NS 250ML (10MCG/ML) PREMIX INFUSION
100.0000 ug/h | INTRAVENOUS | Status: DC
  Administered 2022-10-30: 100 ug/h via INTRAVENOUS
  Administered 2022-10-31: 150 ug/h via INTRAVENOUS
  Filled 2022-10-30 (×2): qty 250

## 2022-10-30 MED ORDER — MORPHINE SULFATE (PF) 2 MG/ML IV SOLN: 2.0000 mg | INTRAVENOUS | Status: DC | PRN

## 2022-10-30 MED ORDER — GLYCOPYRROLATE 0.2 MG/ML IJ SOLN
0.4000 mg | Freq: Four times a day (QID) | INTRAMUSCULAR | Status: DC
  Administered 2022-10-30 – 2022-10-31 (×5): 0.4 mg via INTRAVENOUS
  Filled 2022-10-30 (×6): qty 2

## 2022-10-30 MED ORDER — FENTANYL CITRATE PF 50 MCG/ML IJ SOSY
100.0000 ug | PREFILLED_SYRINGE | INTRAMUSCULAR | Status: AC
  Administered 2022-10-30: 100 ug via INTRAVENOUS
  Filled 2022-10-30: qty 2

## 2022-10-30 MED ORDER — HYDROMORPHONE HCL 1 MG/ML IJ SOLN
1.0000 mg | INTRAMUSCULAR | Status: DC | PRN
  Administered 2022-10-30 (×2): 1 mg via INTRAVENOUS
  Filled 2022-10-30 (×2): qty 1

## 2022-10-30 NOTE — Progress Notes (Signed)
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  Met with patient and family in room.  Present was patient's wife, patient's son, wife's mother and patient's brother via phone. As I introduced myself and told family I was from hospice, wife stated "he is not doing hospice."  Explained that this visit was an information visit requested to give family and patient options on what care might look like after hospitalization.  Emphasized to patient and family that they were in driver's seat and could choose to [facility] hospice services or seek more aggressive care options if they wish.  Explained hospice philosophy, services, and goals of care. Sister and wife verbalize understanding but have requested additional time to consider they're options.  Wife was given [facility] liaison contact information for additional questions or if they would like to discuss further.  Request made by family to discuss options with attending.  Bedside RN was made aware of request and will follow up with MD.  Liaison will follow up with family tomorrow to see if they have additional questions regarding services. Yong Channel with PMT aware of outcome of discussion.  Thank you for the opportunity to participate in this patient's care.  Doreatha Martin, RN [facility] Liaison [phone]  Doreatha Martin

## 2022-10-30 NOTE — Progress Notes (Signed)
FMTS Interim Progress Note  S:Went to assess patient with Dr. Laroy Apple after RN messaged about increased secretions. Patient is in pain. Fentanyl drip helped previously. He would like an extra dose of robinul too to help with secretions.  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 Previously on fentanyl. Cannot obtain fentanyl drip unless in ICU or on floor unless comfort care. Will start dilaudid IV prn for better pain control given no longer comfort care. Continue morphine, oxycodone prn. Will also add extra dose of robinul 0.4 mg for increased secretions. Continue regular TID robinul dosing.  Janeal Holmes, MD 10/30/2022, 5:32 AM PGY-1, [facility] Family Medicine Service pager [phone]

## 2022-10-30 NOTE — Assessment & Plan Note (Addendum)
Ongoing discussions regarding comfort care. Follow-up palliative care. -Duonebs -Mucomyst -Respiratory support, but no escalation past Bipap

## 2022-10-30 NOTE — Progress Notes (Signed)
Unable to do NIF at this time.

## 2022-10-30 NOTE — Care Management Important Message (Signed)
Important Message  Patient Details  Name: Ronnie [name] MRN: [id number] Date of Birth: Apr 10, 1976   Medicare Important Message Given:  Yes     Renie Ora 10/30/2022, 9:28 AM

## 2022-10-30 NOTE — Progress Notes (Signed)
Approximately 1230pm, myself and attending physician Dr. Jennette Kettle were asked to discuss overall status of health and prognosis with patient and family members. Upon entering room, patient's wife, son and 5-6 further family members were present. Main discussion was led by patient's close sister over the phone as she requested information directly from Dr. Jennette Kettle as attending physician, while respectfully appreciating care provided by residents thus far.   Dr. Jennette Kettle informatively explained that while pneumonia was still present, the overall prognosis and current state of health is being driven by patient's muscular dystrophy. There is no benefit from feeding tube nor further antibiotic measures. Additionally, feeding tube would be inappropriate as patient may not even survive that procedure. Patient is responding to fentanyl gtt more than prior morphine gtt for his air hunger. We cannot offer further resources in the effort to "fight this" as patient's sister stated that was still the hopeful goal. Unfortunately, prognosis is consequence of his muscular dystrophy deteriorating his respiratory ability. Upon hearing this information, patient's wife began to cry and sister stated while the news was saddening, she appreciates the direct answers that were provided. Myself and Dr. Jennette Kettle offered our support and offered further support by Chaplain. Family was appreciative of this, held hands briefly with myself and Dr. Jennette Kettle. Chaplain was consulted.   No changes from current comfort care measures were made.

## 2022-10-30 NOTE — Progress Notes (Signed)
   10/30/22 0443  Assess: MEWS Score  Pulse Rate (!) 113  Assess: MEWS Score  MEWS Temp 0  MEWS Systolic 0  MEWS Pulse 2  MEWS RR 2  MEWS LOC 0  MEWS Score 4  MEWS Score Color Red  Assess: if the MEWS score is Yellow or Red  Were vital signs taken at a resting state? Yes  Focused Assessment No change from prior assessment  Does the patient meet 2 or more of the SIRS criteria? No  MEWS guidelines implemented  No, previously red, continue vital signs every 4 hours  Assess: SIRS CRITERIA  SIRS Temperature  0  SIRS Pulse 1  SIRS Respirations  1  SIRS WBC 0  SIRS Score Sum  2

## 2022-10-30 NOTE — Progress Notes (Signed)
   10/30/22 1502  Spiritual Encounters  Type of Visit Follow up  Care provided to: Pt and family  Referral source Nurse (RN/NT/LPN)  Reason for visit Routine spiritual support  OnCall Visit No   Chaplain visited to follow-up with PT and family. The are currently wrestling about whether PT should go home on hospice or go to a hospice facility.  They are weighing the pro's and con's of each and still unsure.  PT reports that he is not in any pain, which is good, but breathing is still labored.  PT has great family support system and the help of his greater community as well, which is encouraging.  Chaplain will continue to follow up with this family as long as PT remains here.

## 2022-10-30 NOTE — Progress Notes (Signed)
     Daily Progress Note Intern Pager: [phone]  Patient name: Lemar Lyga Medical record number: [id number] Date of birth: February 08, 1976 Age: 47 y.o. Gender: male  Primary Care Provider: Claiborne Rigg, NP Consultants: Palliative Care Code Status: DNR  Pt Overview and Major Events to Date:  -Admitted 06/01 -Palliative Consult, Transfer to floor 06/03  Assessment and Plan: Carols Ragazzo is a 47 y.o. admitted for community acquire pneumonia in setting of advanced muscular dystrophy. CAP adequately, patient reaching end of life due to respiratory impairment due muscular dystrophy, and continued aspiration risk. Pertinent PMH/PSH includes muscular dystrophy, HTN.   Active Hospital Problems   *Lobar pneumonia, unspecified organism [facility])           Ongoing discussions regarding comfort care.           Follow-up palliative care.           -Duonebs           -Mucomyst           -Respiratory support, but no escalation past Bipap    Muscular dystrophy (HCC)           Follow-up palliative note.           -Restart fentanyl gtt           -Robinul           -Ativan prn    Chronic systolic heart failure [facility])    Dysphagia   Resolved Hospital Problems No resolved problems to display.     FEN/GI: Dysphagia 1 PPx: Lovenox Dispo:Pending palliative discussions  Subjective:  Got extra dose of Robinol last night. Having increased secretions. Further discussion to with palliative care and family. Per discussion will resume comfort care measures. Patient frustrated and upset.  Objective: Temp:  [98.1 F (36.7 C)-99.1 F (37.3 C)] 98.1 F (36.7 C) (06/06 1144) Pulse Rate:  [89-122] 122 (06/06 1144) Resp:  [15-29] 20 (06/06 1144) BP: (99-148)/(76-109) 110/76 (06/06 1144) SpO2:  [95 %-100 %] 96 % (06/06 1144) Weight:  [63.5 kg] 63.5 kg (06/06 0434) Physical Exam: General: NAD, ill appearing Cardiovascular: RRR, no murmurs, no peripheral edema Respiratory: increased WOB on 6L  (retractions), CTAB, no wheezes, ronchi or rales Abdomen: soft, NTTP, no rebound or guarding Extremities: Moving all 4 extremities equally   Laboratory: Most recent CBC Lab Results  Component Value Date   WBC 13.0 (H) 10/27/2022   HGB 11.6 (L) 10/27/2022   HCT 36.6 (L) 10/27/2022   MCV 94.6 10/27/2022   PLT 275 10/27/2022   Most recent BMP    Latest Ref Rng & Units 10/27/2022    4:22 AM  BMP  Glucose 70 - 99 mg/dL 93   BUN 6 - 20 mg/dL 7   Creatinine 1.47 - 8.29 mg/dL 5.62   Sodium 130 - 865 mmol/L 138   Potassium 3.5 - 5.1 mmol/L 3.7   Chloride 98 - 111 mmol/L 104   CO2 22 - 32 mmol/L 20   Calcium 8.9 - 10.3 mg/dL 8.8      Imaging/Diagnostic Tests:  CXR - Right middle lobe pneumonia.  Celine Mans, MD 10/30/2022, 1:38 PM  PGY-1, [facility] Family Medicine FPTS Intern pager: [phone], text pages welcome Secure chat group [facility] Teaching Service

## 2022-10-30 NOTE — Progress Notes (Signed)
   10/30/22 0117  Assess: MEWS Score  BP (!) 147/103  Pulse Rate (!) 112  Assess: MEWS Score  MEWS Temp 0  MEWS Systolic 0  MEWS Pulse 2  MEWS RR 1  MEWS LOC 0  MEWS Score 3  MEWS Score Color Yellow  Assess: if the MEWS score is Yellow or Red  Were vital signs taken at a resting state? Yes  Focused Assessment No change from prior assessment  Does the patient meet 2 or more of the SIRS criteria? No  MEWS guidelines implemented  No, previously yellow, continue vital signs every 4 hours  Assess: SIRS CRITERIA  SIRS Temperature  0  SIRS Pulse 1  SIRS Respirations  1  SIRS WBC 0  SIRS Score Sum  2

## 2022-10-30 NOTE — Progress Notes (Signed)
   10/30/22 0434  Assess: MEWS Score  Temp 99.1 F (37.3 C)  BP (!) 142/97  MAP (mmHg) 110  Pulse Rate (!) 120  ECG Heart Rate (!) 121  Resp (!) 29  SpO2 95 %  O2 Device Nasal Cannula  O2 Flow Rate (L/min) 2 L/min  Assess: MEWS Score  MEWS Temp 0  MEWS Systolic 0  MEWS Pulse 2  MEWS RR 2  MEWS LOC 0  MEWS Score 4  MEWS Score Color Red  Assess: if the MEWS score is Yellow or Red  Were vital signs taken at a resting state? Yes  Focused Assessment Change from prior assessment (see assessment flowsheet)  Does the patient meet 2 or more of the SIRS criteria? No  MEWS guidelines implemented  No, other (Comment) (pt was in pain, gave pain meds)  Assess: SIRS CRITERIA  SIRS Temperature  0  SIRS Pulse 1  SIRS Respirations  1  SIRS WBC 0  SIRS Score Sum  2

## 2022-10-30 NOTE — Progress Notes (Signed)
   10/30/22 0115  Assess: MEWS Score  BP (!) 148/106  Pulse Rate (!) 117  Resp (!) 22  SpO2 96 %  O2 Device Nasal Cannula  O2 Flow Rate (L/min) 2 L/min  Assess: MEWS Score  MEWS Temp 0  MEWS Systolic 0  MEWS Pulse 2  MEWS RR 1  MEWS LOC 0  MEWS Score 3  MEWS Score Color Yellow  Assess: if the MEWS score is Yellow or Red  Were vital signs taken at a resting state? Yes  Focused Assessment No change from prior assessment  Does the patient meet 2 or more of the SIRS criteria? No  MEWS guidelines implemented  No, previously yellow, continue vital signs every 4 hours  Assess: SIRS CRITERIA  SIRS Temperature  0  SIRS Pulse 1  SIRS Respirations  1  SIRS WBC 0  SIRS Score Sum  2

## 2022-10-30 NOTE — Progress Notes (Addendum)
Palliative:  HPI: 47 y.o. male with past medical history of muscular dystrophy, hypertension, substance use admitted on 10/25/2022 with shortness of breath, cough, congestion, wheezing related to RLL pneumonia in the setting of progressing muscular dystrophy.   I was called to bedside by attending team when Ronnie [name] was having acute distress. When I arrived he expressed that it was very difficult for him to breathe and his heart rate was 140s. He appeared tired and fatigued - very poor reserve. Ronnie [name] tells me that he wants to go home. I reiterated that in order for him to go home and get the medicine needed to provide him relief from his pain and shortness of breath he would need help from hospice. Ronnie [name] agrees. I backed up the conversation to discuss how we can immediately care for him and we can discuss getting home once we get him some improved relief. I was able to get Ronnie [name], Ronnie [name], on phone and we reviewed changes and Ronnie [name]'s wishes. Both agree to restart fentanyl infusion to provide comfort care. She is en-route to hospital.   I met with Ronnie [name] and Ronnie [name] as well as Ronnie [name] at bedside. We again reviewed changes and Ronnie [name]'s wishes. We reviewed his muscular dystrophy, dysphagia and aspiration, and weakened state from pneumonia which is likely a complication from his muscular dystrophy due to weakened swallow muscles and aspiration leading to pneumonia. They know there is no treatment for his muscular dystrophy. They ask about feeding tube and I reviewed that a feeding tube would not be beneficial as this will give him nutrition but it will not do anything to help his lungs/breathing and in fact can increase secretions and make aspiration worse. We also discussed that he would not tolerate feeding tube placement and the risks outweigh the benefits. A feeding tube unfortunately would not prolong his life at this stage. They both agree with comfort care at this time after discussion and questions answered. I  offered to speak with his sister or any other family member if needed but Ronnie [name] reports that she will update everyone. Ronnie [name] continues to express desire to leave the hospital. We discussed hospice at home vs hospice facility. I did express my concern that his symptoms may be more difficult to manage at home and this may be difficult for family to provide his care. Ronnie [name] and Ronnie [name] express interest in Ronnie [name] as this is near home. Ronnie [name] agrees [facility] [facility] is a good option. I explained that I will involve hospice to speak with them based on their expressed interest and they agree.   I updated chaplain Ronnie [name] that assisted in marriage ceremony a couple days ago. I requested that he follow up for additional support given changes this morning and their ongoing relationship and rapport.   Update: I was updated by hospice liaison that upon her visit they expressed that they did not want hospice and that they had questions/concerns and wished to speak to the attending team. I spoke with Dr. Barbaraann Faster and Dr. Jennette Kettle who report they will meet with Ronnie [name] and family to further discuss. Hopefully they can provide the information to help Ronnie [name] and his family during this difficult illness and decline.   Update: Informed by Dr. Velna Ochs that goal is still comfort care. I discussed with hospice liaison who will hold on further conversation today but will follow up for any hospice interest tomorrow. Discussed with my colleague Ronnie Dark, NP who will follow up tomorrow.   All questions/concerns addressed to best of my ability.  Emotional support provided. Discussed with Family Medicine Team, RN, pharmacy, chaplain, and hospice liaison.   Exam: Alert, oriented. Breathing shallow but labored, warm, diaphoretic. Increased secretions. HR 140s. Fatigue. Poor reserve. Abd flat.   Plan: - Comfort care - Fentanyl infusion: bolus and titrate as needed to achieve comfort/relief.  - Increased frequency of scheduled Robinul  for secretion management.   100 min  Ronnie Channel, NP Palliative Medicine Team Pager [phone] (Please see [email or web address] for schedule) Team Phone [phone]    Greater than 50%  of this time was spent counseling and coordinating care related to the above assessment and plan

## 2022-10-30 NOTE — Progress Notes (Signed)
FMTS Interim Progress Note  S:Received secure chat from nurse that HR in 140's, went to see patient. In room with Dr. Velna Ochs and Larena Sox. Patient laying in bed, tachypneaic, and tachycardic, denies pain, conversant and stating he wants to go home/ would be willing to go home with hospice.  O: BP (!) 142/97 (BP Location: Left Arm)   Pulse (!) 113   Temp 99.1 F (37.3 C) (Oral)   Resp (!) 29   Ht 5\' 11"  (1.803 m)   Wt 63.5 kg   SpO2 95%   BMI 19.52 kg/m   General: Chronically ill, cachetic, weak Pulm: Tachypneic, poor air movement/respiratory effort, course end expiratory breath sounds Cardiac: Tachycardic, S1/S2, no RGM    A/P: Tachycardia Patient w/ tachycardia to 140's, sitting in bed tachypneic but not in acute distress. Patient is wanting to go home and says he'd like to go home with hospice. HR likely elevated in setting of poor respiratory function/air hunger?, will obtain EKG and consider medicine to slow him down. Will also give dose of pain medicine to see if this improves tachypnea.  -Give his PRN Diluadid 1 mg -EKG stat -Consider medication to slow HR  -Palliative care consulted and will see patient soon  Bess Kinds, MD 10/30/2022, 9:34 AM PGY-2, [facility] Family Medicine Service pager [phone]

## 2022-10-30 NOTE — Assessment & Plan Note (Addendum)
Follow-up palliative note. -Restart fentanyl gtt -Robinul -Ativan prn

## 2022-10-30 NOTE — Progress Notes (Signed)
   10/29/22 2311  Assess: MEWS Score  Temp 98.3 F (36.8 C)  BP (!) 148/109  Pulse Rate (!) 113  Resp 18  SpO2 97 %  O2 Device Nasal Cannula  O2 Flow Rate (L/min) 2 L/min  Assess: MEWS Score  MEWS Temp 0  MEWS Systolic 0  MEWS Pulse 2  MEWS RR 0  MEWS LOC 0  MEWS Score 2  MEWS Score Color Yellow  Assess: if the MEWS score is Yellow or Red  Were vital signs taken at a resting state? Yes  Focused Assessment Change from prior assessment (see assessment flowsheet)  Does the patient meet 2 or more of the SIRS criteria? No  MEWS guidelines implemented  No, other (Comment) (pt was in pain, pain meds given)  Assess: SIRS CRITERIA  SIRS Temperature  0  SIRS Pulse 1  SIRS Respirations  0  SIRS WBC 0  SIRS Score Sum  1

## 2022-10-31 DIAGNOSIS — Z515 Encounter for palliative care: Secondary | ICD-10-CM | POA: Diagnosis not present

## 2022-10-31 DIAGNOSIS — J181 Lobar pneumonia, unspecified organism: Secondary | ICD-10-CM | POA: Diagnosis not present

## 2022-10-31 DIAGNOSIS — Z7189 Other specified counseling: Secondary | ICD-10-CM | POA: Diagnosis not present

## 2022-10-31 DIAGNOSIS — G7100 Muscular dystrophy, unspecified: Secondary | ICD-10-CM | POA: Diagnosis not present

## 2022-10-31 LAB — MAGNESIUM: Magnesium: 1.8 mg/dL (ref 1.7–2.4)

## 2022-10-31 MED ORDER — LIDOCAINE 5 % EX PTCH: 1.0000 | MEDICATED_PATCH | CUTANEOUS | 0 refills | Status: DC

## 2022-10-31 MED ORDER — LORAZEPAM 0.5 MG PO TABS: 0.5000 mg | ORAL_TABLET | ORAL | 0 refills | Status: DC | PRN

## 2022-10-31 MED ORDER — LORAZEPAM 2 MG/ML IJ SOLN: 0.5000 mg | INTRAMUSCULAR | 0 refills | Status: DC | PRN

## 2022-10-31 MED ORDER — GLYCOPYRROLATE 0.2 MG/ML IJ SOLN: 0.4000 mg | Freq: Four times a day (QID) | INTRAMUSCULAR | Status: DC

## 2022-10-31 MED ORDER — METOPROLOL TARTRATE 5 MG/5ML IV SOLN
2.5000 mg | Freq: Two times a day (BID) | INTRAVENOUS | Status: DC
  Administered 2022-10-31 (×2): 2.5 mg via INTRAVENOUS
  Filled 2022-10-31 (×2): qty 5

## 2022-10-31 MED ORDER — NICOTINE 14 MG/24HR TD PT24: 14.0000 mg | MEDICATED_PATCH | Freq: Once | TRANSDERMAL | Status: DC

## 2022-10-31 MED ORDER — FENTANYL BOLUS VIA INFUSION: 50.0000 ug | INTRAVENOUS | 0 refills | Status: DC | PRN

## 2022-10-31 MED ORDER — METOPROLOL TARTRATE 5 MG/5ML IV SOLN: 2.5000 mg | Freq: Two times a day (BID) | INTRAVENOUS | Status: DC

## 2022-10-31 MED ORDER — FENTANYL 2500MCG IN NS 250ML (10MCG/ML) PREMIX INFUSION: 100.0000 ug/h | INTRAVENOUS | 0 refills | Status: DC

## 2022-10-31 MED ORDER — POLYETHYLENE GLYCOL 3350 17 G PO PACK: 17.0000 g | PACK | Freq: Every day | ORAL | 0 refills | Status: DC | PRN

## 2022-10-31 MED ORDER — IPRATROPIUM-ALBUTEROL 0.5-2.5 (3) MG/3ML IN SOLN: 3.0000 mL | Freq: Three times a day (TID) | RESPIRATORY_TRACT | Status: DC | PRN

## 2022-10-31 NOTE — Discharge Summary (Signed)
Family Medicine Teaching [facility] Discharge Summary  Patient name: Ronnie [name] Medical record number: [id number] Date of birth: 12-17-1975 Age: 47 y.o. Gender: male Date of Admission: 10/25/2022  Date of Discharge: 10/31/2022 Admitting Physician: Tomma Lightning, MD  Primary Care Provider: Claiborne Rigg, NP Consultants: Palliative Care  Indication for Hospitalization: CAP  Discharge Diagnoses/Problem List:  Principal Problem for Admission: End stage muscular dystrophy Other Problems addressed during stay:  Principal Problem:   Lobar pneumonia, unspecified organism [facility]) Active Problems:   Muscular dystrophy (HCC)   Chronic systolic heart failure (HCC)   Dysphagia   Concern about end of life   Palliative care status    Brief Hospital Course:  Ronnie [name] is a 47 y.o. male who was admitted to the [facility] Medicine Teaching Service at [facility] for PNA. PMH includes muscular dystrophy, HTN, and substance use. Hospital course is outlined below by problem.   PNA Presented to the ED with SOB, cough, chills, and body aches on 6/1.  Was tachycardic and hypertensive.  Placed on 4 L nasal cannula.  Uses BiPAP QHS at home in setting of muscular dystrophy/myofibrillar myopathy.  Pulmonology/CCM consulted who admitted patient and placed on azithromycin and cefepime for CAP, seen on CT Chest. He was ultimately weaned down to 2 L nasal cannula and transferred to FMTS on 6/3.  On transfer CCM expressed that there were not further options to optimize respiratory care, and patient's increased work of breathing and oxygen requirement are symptoms of his muscular dystrophy. Mucomyst and Duonebs continued through admission. On the floor, patient had steady oxygen requirement that was unable to be weaned and increased work of breathing. Due to muscular dystrophy and progressive decline, palliative care consulted as below. Patient comfort and air hunger improved with fentanyl gtt. Oxygen was  continued for comfort at 2L [place] and patient was discharged to hospice.  Myofibrillar myopathy Neurologist consulted who recommended follow-up with neuromuscular specialist outpatient. Given declining respiratory status and progressing condition; palliative consulted and discussed goals of care. After several discussions regarding progression of MD and declining status clinically, ultimately family and patient decided to opt for comfort care measures, with some limited interventions at the family's request. Discharged to hospice.  CHF  NICM  Cardiomyopathy Echo updated this admission given some concern for contribution to dyspnea. EF 35-40%, LV global hypokinesis, mild MVR. EF previously 15% and then 50% in 2022. Significant tachycardia throughout admission attributed to pain and muscular dystrophy disease progression of the heart. EKG demonstrated sinus tachycardia. Mild improvement with comfort care measures. Patient's home Losartan/Coreg initially restarted then was held as part of comfort care conversations.   Other conditions that were chronic and stable: HTN (home losartan, amlodipine, and coreg held), OSA  Issues for follow up: Pain control and comfort at hospice.   Disposition: hospice home  Discharge Condition: declining   Discharge Exam:  Vitals:   10/31/22 0800 10/31/22 1042  BP:    Pulse:    Resp:    Temp:    SpO2: (!) 85% 91%   General: ill-appearing, mildly diaphoretic, cachetic Cardiovascular: tachycardic, no murmurs, no peripheral edema Respiratory: increased work of breathing on 2L [place] Abdomen: soft, NTTP, no rebound or guarding Extremities: Moving all 4 extremities equally  Significant Procedures: none  Significant Labs and Imaging:  No results for input(s): "WBC", "HGB", "HCT", "PLT" in the last 48 hours. Recent Labs  Lab 10/31/22 1517  MG 1.8    ECHO 10/26/2022  1. Left ventricular ejection fraction, by  estimation, is 35 to 40%. The  left ventricle has  moderately decreased function. The left ventricle  demonstrates global hypokinesis. Left ventricular diastolic parameters are  indeterminate.   2. Right ventricular systolic function is normal. The right ventricular  size is normal. Tricuspid regurgitation signal is inadequate for assessing  PA pressure.   3. The mitral valve is normal in structure. Mild mitral valve  regurgitation. No evidence of mitral stenosis.   4. The aortic valve is grossly normal. Aortic valve regurgitation is not  visualized. No aortic stenosis is present.   5. The inferior vena cava is normal in size with greater than 50%  respiratory variability, suggesting right atrial pressure of 3 mmHg.   CT Chest 10/25/22 1. Large area of consolidation within the dependent right lower lobe with adjacent ill-defined and ground-glass opacities. Additional ground-glass and patchy opacity within the central right middle lobe. Findings are suspicious for pneumonia. Given the debris layering in the trachea and right-sided bronchi, aspiration is considered. Recommend radiographic follow-up with PA and lateral views after course of treatment to ensure resolution. 2. Debris layering in the trachea tracking into the right mainstem bronchus, bronchus intermedius, and right lower lobe with occasional areas of mucoid impaction. Additional areas of mucoid impaction in the right upper lobe. As above, consider aspiration. 3. Shotty mediastinal nodes are likely reactive.   Results/Tests Pending at Time of Discharge: none  Discharge Medications:  Allergies as of 10/31/2022       Reactions   Lisinopril Cough        Medication List     STOP taking these medications    amLODipine 5 MG tablet Commonly known as: NORVASC   carvedilol 6.25 MG tablet Commonly known as: COREG   guaiFENesin 100 MG/5ML liquid Commonly known as: ROBITUSSIN   oxyCODONE 15 MG immediate release tablet Commonly known as: ROXICODONE   pregabalin 150  MG capsule Commonly known as: LYRICA       TAKE these medications    fentaNYL 10 mcg/ml Soln infusion Inject 100-200 mcg/hr into the vein continuous.   fentaNYL Soln Commonly known as: SUBLIMAZE Inject 50-100 mcg into the vein every 30 (thirty) minutes as needed (sob).   glycopyrrolate 0.2 MG/ML injection Commonly known as: ROBINUL Inject 2 mLs (0.4 mg total) into the vein 4 (four) times daily.   ipratropium-albuterol 0.5-2.5 (3) MG/3ML Soln Commonly known as: DUONEB Take 3 mLs by nebulization every 8 (eight) hours as needed.   lidocaine 5 % Commonly known as: LIDODERM Place 1 patch onto the skin daily. Remove & Discard patch within 12 hours or as directed by MD   LORazepam 0.5 MG tablet Commonly known as: ATIVAN Take 1 tablet (0.5 mg total) by mouth every 4 (four) hours as needed for anxiety.   LORazepam 2 MG/ML injection Commonly known as: ATIVAN Inject 0.25 mLs (0.5 mg total) into the vein every 4 (four) hours as needed for anxiety.   metoprolol tartrate 5 MG/5ML Soln injection Commonly known as: LOPRESSOR Inject 2.5 mLs (2.5 mg total) into the vein every 12 (twelve) hours.   polyethylene glycol 17 g packet Commonly known as: MIRALAX / GLYCOLAX Take 17 g by mouth daily as needed for moderate constipation.        Discharge Instructions: Please refer to Patient Instructions section of EMR for full details.  Patient was counseled important signs and symptoms that should prompt return to medical care, changes in medications, dietary instructions, activity restrictions, and follow up appointments.   Follow-Up Appointments:  Celine Mans, MD 10/31/2022, 4:15 PM PGY-1, [facility] Health Family Medicine

## 2022-10-31 NOTE — Progress Notes (Signed)
Daily Progress Note   Patient Name: Ronnie [name]       Date: 11/01/2022 DOB: April 17, 1976  Age: 47 y.o. MRN#: [id number] Attending Physician: No att. providers found Primary Care Physician: Ronnie Rigg, NP Admit Date: 10/25/2022  Reason for Consultation/Follow-up: Establishing goals of care  Subjective: Patient reports discomfort this AM but feels better since fentanyl drip was increased, asking to go home  Length of Stay: 6  Current Medications: Scheduled Meds:    Continuous Infusions:   PRN Meds:   Physical Exam Constitutional:      General: He is not in acute distress.    Appearance: He is ill-appearing.  Pulmonary:     Effort: Pulmonary effort is normal.  Skin:    General: Skin is warm and dry.  Neurological:     Mental Status: He is alert and oriented to person, place, and time.             Vital Signs: BP (!) 131/91 (BP Location: Right Arm)   Pulse (!) 127   Temp 99.3 F (37.4 C) (Oral)   Resp 20   Ht 5\' 11"  (1.803 m)   Wt 63.5 kg   SpO2 96%   BMI 19.52 kg/m  SpO2: SpO2: 96 % O2 Device: O2 Device: Nasal Cannula O2 Flow Rate: O2 Flow Rate (L/min): 2 L/min  Intake/output summary:  Intake/Output Summary (Last 24 hours) at 11/01/2022 0736 Last data filed at 10/31/2022 2255 Gross per 24 hour  Intake 345.95 ml  Output --  Net 345.95 ml   LBM: Last BM Date : 10/27/22 Baseline Weight: Weight: 70.3 kg Most recent weight: Weight: 63.5 kg       Palliative Assessment/Data: PPS 20%      Patient Active Problem List   Diagnosis Date Noted   Palliative care status 10/31/2022   Concern about end of life 10/30/2022   Muscular dystrophy (HCC) 10/27/2022   Chronic systolic heart failure (HCC) 10/27/2022   Dysphagia 10/27/2022   Lobar pneumonia, unspecified organism  (HCC) 10/25/2022   LVH (left ventricular hypertrophy) 05/26/2019   Routine adult health maintenance 12/26/2016   Sleep apnea 12/26/2016   NICM (nonischemic cardiomyopathy) (HCC) 10/03/2016   Essential hypertension 10/03/2016   Encounter for orogastric (OG) tube placement    Altered mental state 09/11/2016   Acute kidney injury (HCC) 09/11/2016   Acute respiratory failure (HCC) 09/11/2016   Lactic acidosis 09/11/2016   Smoker 09/11/2016   ETOH abuse 09/11/2016   Cocaine abuse (HCC) 09/11/2016    Palliative Care Assessment & Plan   HPI: 47 y.o. male with past medical history of muscular dystrophy, hypertension, substance use admitted on 10/25/2022 with shortness of breath, cough, congestion, wheezing related to RLL pneumonia in the setting of progressing muscular dystrophy.   Assessment: Patient reports symptoms are well controlled. He is having trouble coughing up secretions. We discussed medications we are utilizing for secretions. Discussed option for nasotracheal suction. He understands we can do this if we need to but wants to hold off for now.  He tells me that he just wants to go home. Family at bedside asking if he can transfer to [facility]. Patient reiterates he does not want to transfer  to another hospital - he wants to go home and see his grandchildren. We discuss that to consider leaving the hospital he will need the support of hospice given current medication needs. He expresses understanding and would like to speak to hospice liaison. Assisted him with drinking some thickened liquid.  Outside of the room spoke with multiple family members regarding disease process and prognosis. Multiple questions answered. Attempted to help family focus on patient's desires and quality of life. Wife does not want to speak to hospice but we reviewed Yasseen's desires and she is agreeable to me asking hospice to come speak to him.  Recommendations/Plan: Will ask hospice liaison to meet  with family to discuss discharge options with hospice support Continue fentanyl infusion for comfort Consider NTS  Goals of Care and Additional Recommendations: Limitations on Scope of Treatment: Full Comfort Care  Code Status: DNR  Care plan was discussed with patient, family members to include wife, RN, family medicine team, TOC, hospice liaison  Thank you for allowing the Palliative Medicine Team to assist in the care of this patient.   *Please note that this is a verbal dictation therefore any spelling or grammatical errors are due to the "Dragon Medical One" system interpretation.  Ronnie Ren, DNP, [facility] Palliative Medicine Team Team Phone # [phone]  Pager [phone]

## 2022-10-31 NOTE — Progress Notes (Signed)
   10/31/22 1057  Spiritual Encounters  Type of Visit Follow up  Care provided to: Significant other   PT's wife found me in the hallway and she was weeping.  Chaplain provided a quiet place for her as she and I went into the chapel to talk.  Chaplain practiced reflective listening to continue to support wife.  Chaplain also assisted wife in identifying the strengths and the relationships that are present in her life that are going to help her in the future as she deals with grief. Wife discussed a few of the supportive as well as a few of the complicated relationship dynamics present in their journey and Chaplain helped her process her concerns. Chaplain is informed that there will be a family meeting with Palliative Care today at 1300 and I plan to be there

## 2022-10-31 NOTE — Plan of Care (Signed)
°  Problem: Education: °Goal: Knowledge of General Education information will improve °Description: Including pain rating scale, medication(s)/side effects and non-pharmacologic comfort measures °Outcome: Adequate for Discharge °  °Problem: Health Behavior/Discharge Planning: °Goal: Ability to manage health-related needs will improve °Outcome: Adequate for Discharge °  °Problem: Clinical Measurements: °Goal: Ability to maintain clinical measurements within normal limits will improve °Outcome: Adequate for Discharge °Goal: Will remain free from infection °Outcome: Adequate for Discharge °Goal: Diagnostic test results will improve °Outcome: Adequate for Discharge °Goal: Respiratory complications will improve °Outcome: Adequate for Discharge °Goal: Cardiovascular complication will be avoided °Outcome: Adequate for Discharge °  °Problem: Safety: °Goal: Ability to remain free from injury will improve °Outcome: Adequate for Discharge °  °Problem: Skin Integrity: °Goal: Risk for impaired skin integrity will decrease °Outcome: Adequate for Discharge °  °

## 2022-10-31 NOTE — Progress Notes (Signed)
PT called RN reporting 10/10 pain. Fentanyl bolus x2, and .Ordered to increase gtt by 25% if two boluses are given within an hour. Rate now at 178mcg/hr. Pt restless, but stated he is starting to feel some relief. 11.16ml of fentanyl wasted with Vanna Scotland, RN

## 2022-10-31 NOTE — Assessment & Plan Note (Signed)
Ongoing discussions regarding comfort care. Follow-up palliative care. -Duonebs -Mucomyst -Respiratory support, but no escalation past Bipap 

## 2022-10-31 NOTE — Progress Notes (Signed)
Patient was picked up by [facility] for transport to [facility] at 22;49. RN at [facility] has confirmed that fentanyl Iv bag and pump is ready at the facility., Pt left in the company of family.

## 2022-10-31 NOTE — Progress Notes (Signed)
Daily Progress Note Intern Pager: [phone]  Patient name: Ronnie [name] Medical record number: [id number] Date of birth: 1975-07-31 Age: 47 y.o. Gender: male  Primary Care Provider: Claiborne Rigg, NP Consultants: Palliative Care Code Status: DNR  Pt Overview and Major Events to Date:  -Admitted 06/01 -Palliative Consult, Transfer to floor 06/03  Assessment and Plan: Ronnie [name] is a 47 y.o. admitted for community acquire pneumonia in setting of advanced muscular dystrophy. CAP adequately treated, patient reaching end of life due to respiratory impairment due muscular dystrophy, and continued aspiration risk. Pertinent PMH/PSH includes muscular dystrophy, HTN.   Per several discussions with Palliative Care and Primary Team. Patient is comfort care, with limited interventions below.  Active Hospital Problems   *Lobar pneumonia, unspecified organism [facility])           Ongoing discussions regarding comfort care.           Follow-up palliative care.           -Duonebs           -Mucomyst           -Respiratory support, but no escalation past Bipap    Concern about end of life    Muscular dystrophy [facility])           Patient considering hospice. Tachycardia likely           affecting comfort.           -Restart fentanyl gtt           -Add IV Metoprolol           -Robinul           -Ativan prn    Chronic systolic heart failure (HCC)           ECHO EF 35-40%. Previously 15%.           -Comfort care           -Hold Losartan    Dysphagia           Diet as preferred.   Resolved Hospital Problems No resolved problems to display.    FEN/GI: Dysphagia 1 PPx: Lovenox Dispo: Pending palliative discussions.  Subjective:  NAEO. Patient's family states he has been in more pain. Patient denies.   Objective: Temp:  [98.1 F (36.7 C)-98.8 F (37.1 C)] 98.7 F (37.1 C) (06/07 0714) Pulse Rate:  [117-122] 121 (06/07 0714) Resp:  [18-21] 21 (06/07 0714) BP:  (117-142)/(82-96) 142/96 (06/07 0714) SpO2:  [85 %-98 %] 91 % (06/07 1042) Physical Exam: General: ill-appearing, mildly diaphoretic, cachetic Cardiovascular: tachycardic, no murmurs, no peripheral edema Respiratory: increased work of breathing on 2L [place] Abdomen: soft, NTTP, no rebound or guarding Extremities: Moving all 4 extremities equally   Laboratory: Most recent CBC Lab Results  Component Value Date   WBC 13.0 (H) 10/27/2022   HGB 11.6 (L) 10/27/2022   HCT 36.6 (L) 10/27/2022   MCV 94.6 10/27/2022   PLT 275 10/27/2022   Most recent BMP    Latest Ref Rng & Units 10/27/2022    4:22 AM  BMP  Glucose 70 - 99 mg/dL 93   BUN 6 - 20 mg/dL 7   Creatinine 9.67 - 8.93 mg/dL 8.10   Sodium 175 - 102 mmol/L 138   Potassium 3.5 - 5.1 mmol/L 3.7   Chloride 98 - 111 mmol/L 104   CO2 22 - 32 mmol/L 20   Calcium 8.9 - 10.3 mg/dL 8.8  None.  Imaging/Diagnostic Tests: NO new imaging.  Celine Mans, MD 10/31/2022, 12:39 PM  PGY-1, [facility] Health Family Medicine FPTS Intern pager: [phone], text pages welcome Secure chat group [facility] Teaching Service

## 2022-10-31 NOTE — TOC Transition Note (Signed)
Transition of Care [facility]) - CM/SW Discharge Note   Patient Details  Name: Ronnie [name] MRN: [id number] Date of Birth: April 01, 1976  Transition of Care [facility]) CM/SW Contact:  Leander Rams, LCSW Phone Number: 10/31/2022, 4:11 PM   Clinical Narrative:    Patient will DC to: [facility]  Anticipated DC date: 10/31/2022 Family notified: [name]  Transport by: Sharin Mons   Per MD patient ready for DC to [facility]. RN, patient, patient's family, and facility notified of DC. Discharge Summary and FL2 sent to facility. RN to call report prior to discharge [phone]. DC packet on chart. Ambulance transport requested for patient.   CSW will sign off for now as social work intervention is no longer needed. Please consult [place] again if new needs arise.    Final next level of care: Hospice Medical Facility Barriers to Discharge: No Barriers Identified   Patient Goals and CMS Choice      Discharge Placement                Patient chooses bed at:  [facility]) Patient to be transferred to facility by: [facility] Name of family member notified: [name] (spouse) Patient and family notified of of transfer: 10/31/22  Discharge Plan and Services Additional resources added to the After Visit Summary for                                       Social Determinants of Health (SDOH) Interventions SDOH Screenings   Depression (PHQ2-9): Low Risk  (04/24/2020)  Tobacco Use: High Risk (10/25/2022)     Readmission Risk Interventions     No data to display           Oletta Lamas, MSW, LCSWA, LCASA Transitions of Care  Clinical Social Worker I

## 2022-10-31 NOTE — Assessment & Plan Note (Signed)
ECHO EF 35-40%. Previously 15%. -Comfort care -Hold Losartan 

## 2022-10-31 NOTE — Progress Notes (Signed)
[employer] [facility]) Hospital Liaison Note  Met with patient and family at the bedside. Original plan was for patient to return home with hospice. However, after an extensive conversation with the patient, patient's mom, patient's sister, and patient's son they have decided to go to [facility] for symptom management with hopes of returning home once they are managed.   Bed offered and accepted for transfer today. Unit RN please call report to [phone] prior to patient leaving the unit. Please send signed DNR and paperwork with patient.   Please leave all IV access in place.   Please call with any questions or concerns. Thank you.  Lynder Parents [facility] Liaison  [phone]

## 2022-10-31 NOTE — Assessment & Plan Note (Signed)
Diet as preferred. 

## 2022-10-31 NOTE — Assessment & Plan Note (Addendum)
Patient considering hospice. Tachycardia likely affecting comfort. -Restart fentanyl gtt -Add IV Metoprolol -Robinul -Ativan prn

## 2022-10-31 NOTE — Unmapped External Note (Signed)
Transition of Care [facility]) - CM/SW Discharge Note      Patient Details   Name: Paul Lloyd  MRN: [id number]  Date of Birth: 11-Sep-1975    Transition of Care [facility]) CM/SW Contact:   Leander Rams, LCSW  Phone Number:  10/31/2022, 4:11 PM      Clinical Narrative:     Patient will DC to: [facility]   Anticipated DC date: 10/31/2022  Family notified: [name]   Transport by: Sharin Mons      Per MD patient ready for DC to [facility]. RN, patient, patient's family, and facility notified of DC. Discharge Summary and FL2 sent to facility. RN to call report prior to discharge [phone]. DC packet on chart. Ambulance transport requested for patient.     CSW will sign off for now as social work intervention is no longer needed. Please consult [place] again if new needs arise.        Final next level of care: Hospice Medical Facility  Barriers to Discharge: No Barriers Identified      Patient Goals and CMS Choice          Discharge Placement                    Patient chooses bed at:  [facility])  Patient to be transferred to facility by: [facility]  Name of family member notified: [name] (spouse)  Patient and family notified of of transfer: 10/31/22    Discharge Plan and Services  Additional resources added to the After Visit Summary for                                                      Social Determinants of Health (SDOH) Interventions  SDOH Screenings     Depression (PHQ2-9): Low Risk  (04/24/2020)   Tobacco Use: High Risk (10/25/2022)         Readmission Risk Interventions      * No data to display                  Oletta Lamas, MSW, LCSWA, LCASA  Transitions of Care  Clinical Social Worker I      *Some images could not be shown.

## 2022-11-02 ENCOUNTER — Encounter (HOSPITAL_BASED_OUTPATIENT_CLINIC_OR_DEPARTMENT_OTHER): Payer: Self-pay | Admitting: Unknown Physician Specialty

## 2022-11-03 ENCOUNTER — Telehealth (HOSPITAL_BASED_OUTPATIENT_CLINIC_OR_DEPARTMENT_OTHER): Payer: Self-pay | Admitting: Neurology

## 2022-11-03 ENCOUNTER — Encounter (HOSPITAL_BASED_OUTPATIENT_CLINIC_OR_DEPARTMENT_OTHER): Payer: Self-pay | Admitting: Unknown Physician Specialty

## 2022-11-03 NOTE — Telephone Encounter (Signed)
NA

## 2022-11-03 NOTE — Telephone Encounter (Addendum)
Call from patient. Patient also sent an e-care message. He states he is in a hospital in [place] due to aspiration pneumonia. He is on vacation. He needs to talk to Dr. Janann August about a procedure (feeding tube) to prevent aspiration. Patient is upset that "nothing was discussed during his visits about this" and begins to become upset and difficult to understand. Asked to speak to family member. Gave permission  to speak to spouse Paul Lloyd and provided phone number. Call placed. No answer. [facility]. E-care to patient.       9:10 Spouse returned call. Clarified after much discussion. Patient is not in the hospital. "He is in Hospice but shouldn't be there. Everybody says so". They want a feeding tub placed but not at the hospital he was at due to their poor reputation and "lawsuits". He wants a referral to [facility] or [facility] as they are moving there this summer. Dr. Janann August as a colleague in [place] per patient's spouse. Explained to spouse that having a referral does not mean immediate access. If Paul Lloyd is having issues he could arrange to go to the ED in [place] or [facility] or ask the local hospital to transfer him. Spouse states he is refusing to go back to the hospital.   Please advise.

## 2022-11-03 NOTE — Telephone Encounter (Signed)
TC from [place] Nations, ARNP with [facility] in [place], [place]. She indicates pt is currently in their facility, but has changed his mind about his code status and is now full code. ARNP indicates pt wants more aggressive interventions, specifically he wants a feeding tube to be placed. ARNP reports providers pt has seen in [place] indicate pt is not strong enough to undergo FT placement. ARNP indicates family has shared with her Dr. Janann August was going to refer pt to a neuromuscular provider at [facility]. Let ARNP know that Dr. Janann August is currently out of the country, so cannot discuss referral with him about this time. We also discussed that a neurologist would not be able to provide a timely solution to pt's request for a FT. ARNP indicate pt has pneumonia, is febrile, and is unable to cough.    Discussed with ARNP that Dr. Janann August and Dr. Garnette Czech recently saw pt and a number of referrals/orders were placed at that time: PWC, hospital bed, BiPAP, referral to SLP + VFSS. We were not aware pt was travelling to the [facility] and were under the impression these orders were in process. We discussed that a cough assist had been ordered for pt back in December. ARNP indicates to her knowledge pt does not have the device with him.     ARNP indicates pt is considering leaving their hospice facility and making the trip to [facility] to receive care. She has concerns about this as pt is currently on a fentanyl pump and previous attempt to wean him from the pump was unsuccessful (pt became tachy and SOB). ARNP indicates [facility] is approx 60 minutes from their facility.     Provided ARNP with direct contact information for RN line here in clinic. She will be in further communication with pt and family and will reach back out to clinic if necessary.

## 2022-11-04 NOTE — Telephone Encounter (Signed)
Addressed in MyChart message today (6/11).

## 2022-11-04 NOTE — Telephone Encounter (Signed)
TC to pt's SO, [name], to provide her with RN line here in Neurology Clinic. Let her know if the MD team overseeing pt's care want to speak with one of our neurologists they can provide them with this number and we can reach out to provider. SO appreciative. She indicates at this time pt is still in the hospice facility. They have arranged transport to [facility] for tomorrow morning.     SO indicates pt has BiPAP with him for the trip, but it is back at the family home and not with him in the facility. He is currently on O2, but [name] indicates his O2 sats are in the high 90s without oxygen.     They will keep the clinic posted.

## 2022-11-06 DIAGNOSIS — J9612 Chronic respiratory failure with hypercapnia: Secondary | ICD-10-CM | POA: Diagnosis present

## 2022-11-18 ENCOUNTER — Other Ambulatory Visit (HOSPITAL_BASED_OUTPATIENT_CLINIC_OR_DEPARTMENT_OTHER): Payer: Self-pay | Admitting: Neurology

## 2022-11-18 DIAGNOSIS — G7100 Muscular dystrophy, unspecified: Secondary | ICD-10-CM

## 2022-11-18 DIAGNOSIS — M7918 Myalgia, other site: Secondary | ICD-10-CM

## 2022-11-21 ENCOUNTER — Encounter (HOSPITAL_COMMUNITY): Payer: Self-pay

## 2022-11-21 ENCOUNTER — Emergency Department (HOSPITAL_COMMUNITY): Payer: Medicare Other

## 2022-11-21 ENCOUNTER — Inpatient Hospital Stay (HOSPITAL_COMMUNITY)
Admission: EM | Admit: 2022-11-21 | Discharge: 2022-12-25 | DRG: 177 | Disposition: E | Payer: Medicare Other | Attending: Family Medicine | Admitting: Family Medicine

## 2022-11-21 ENCOUNTER — Other Ambulatory Visit: Payer: Self-pay

## 2022-11-21 DIAGNOSIS — Z79899 Other long term (current) drug therapy: Secondary | ICD-10-CM | POA: Diagnosis not present

## 2022-11-21 DIAGNOSIS — F17210 Nicotine dependence, cigarettes, uncomplicated: Secondary | ICD-10-CM | POA: Diagnosis present

## 2022-11-21 DIAGNOSIS — R4589 Other symptoms and signs involving emotional state: Secondary | ICD-10-CM

## 2022-11-21 DIAGNOSIS — J9612 Chronic respiratory failure with hypercapnia: Secondary | ICD-10-CM | POA: Diagnosis present

## 2022-11-21 DIAGNOSIS — I428 Other cardiomyopathies: Secondary | ICD-10-CM | POA: Diagnosis present

## 2022-11-21 DIAGNOSIS — J9602 Acute respiratory failure with hypercapnia: Secondary | ICD-10-CM | POA: Diagnosis not present

## 2022-11-21 DIAGNOSIS — R9431 Abnormal electrocardiogram [ECG] [EKG]: Secondary | ICD-10-CM | POA: Diagnosis present

## 2022-11-21 DIAGNOSIS — E8729 Other acidosis: Secondary | ICD-10-CM | POA: Diagnosis present

## 2022-11-21 DIAGNOSIS — J189 Pneumonia, unspecified organism: Secondary | ICD-10-CM

## 2022-11-21 DIAGNOSIS — Z931 Gastrostomy status: Secondary | ICD-10-CM | POA: Diagnosis not present

## 2022-11-21 DIAGNOSIS — E869 Volume depletion, unspecified: Secondary | ICD-10-CM | POA: Diagnosis present

## 2022-11-21 DIAGNOSIS — J9622 Acute and chronic respiratory failure with hypercapnia: Secondary | ICD-10-CM | POA: Diagnosis not present

## 2022-11-21 DIAGNOSIS — R64 Cachexia: Secondary | ICD-10-CM | POA: Diagnosis present

## 2022-11-21 DIAGNOSIS — I110 Hypertensive heart disease with heart failure: Secondary | ICD-10-CM | POA: Diagnosis present

## 2022-11-21 DIAGNOSIS — G709 Myoneural disorder, unspecified: Secondary | ICD-10-CM | POA: Diagnosis present

## 2022-11-21 DIAGNOSIS — J180 Bronchopneumonia, unspecified organism: Secondary | ICD-10-CM | POA: Diagnosis present

## 2022-11-21 DIAGNOSIS — R52 Pain, unspecified: Secondary | ICD-10-CM | POA: Diagnosis not present

## 2022-11-21 DIAGNOSIS — E43 Unspecified severe protein-calorie malnutrition: Secondary | ICD-10-CM | POA: Diagnosis present

## 2022-11-21 DIAGNOSIS — J439 Emphysema, unspecified: Secondary | ICD-10-CM | POA: Diagnosis present

## 2022-11-21 DIAGNOSIS — F1011 Alcohol abuse, in remission: Secondary | ICD-10-CM | POA: Diagnosis present

## 2022-11-21 DIAGNOSIS — G8929 Other chronic pain: Secondary | ICD-10-CM | POA: Diagnosis present

## 2022-11-21 DIAGNOSIS — Z66 Do not resuscitate: Secondary | ICD-10-CM | POA: Diagnosis present

## 2022-11-21 DIAGNOSIS — J9621 Acute and chronic respiratory failure with hypoxia: Secondary | ICD-10-CM | POA: Diagnosis not present

## 2022-11-21 DIAGNOSIS — D649 Anemia, unspecified: Secondary | ICD-10-CM | POA: Diagnosis present

## 2022-11-21 DIAGNOSIS — D638 Anemia in other chronic diseases classified elsewhere: Secondary | ICD-10-CM | POA: Diagnosis present

## 2022-11-21 DIAGNOSIS — G7100 Muscular dystrophy, unspecified: Secondary | ICD-10-CM | POA: Diagnosis present

## 2022-11-21 DIAGNOSIS — I10 Essential (primary) hypertension: Secondary | ICD-10-CM | POA: Diagnosis present

## 2022-11-21 DIAGNOSIS — Y95 Nosocomial condition: Secondary | ICD-10-CM | POA: Diagnosis present

## 2022-11-21 DIAGNOSIS — R4020 Unspecified coma: Secondary | ICD-10-CM | POA: Diagnosis not present

## 2022-11-21 DIAGNOSIS — T17800A Unspecified foreign body in other parts of respiratory tract causing asphyxiation, initial encounter: Secondary | ICD-10-CM | POA: Diagnosis present

## 2022-11-21 DIAGNOSIS — F1411 Cocaine abuse, in remission: Secondary | ICD-10-CM | POA: Diagnosis present

## 2022-11-21 DIAGNOSIS — J690 Pneumonitis due to inhalation of food and vomit: Principal | ICD-10-CM | POA: Diagnosis present

## 2022-11-21 DIAGNOSIS — G7109 Other specified muscular dystrophies: Secondary | ICD-10-CM | POA: Diagnosis present

## 2022-11-21 DIAGNOSIS — D7589 Other specified diseases of blood and blood-forming organs: Secondary | ICD-10-CM | POA: Diagnosis present

## 2022-11-21 DIAGNOSIS — Z681 Body mass index (BMI) 19 or less, adult: Secondary | ICD-10-CM

## 2022-11-21 DIAGNOSIS — J9819 Other pulmonary collapse: Secondary | ICD-10-CM | POA: Diagnosis present

## 2022-11-21 DIAGNOSIS — Z8249 Family history of ischemic heart disease and other diseases of the circulatory system: Secondary | ICD-10-CM

## 2022-11-21 DIAGNOSIS — R54 Age-related physical debility: Secondary | ICD-10-CM | POA: Diagnosis present

## 2022-11-21 DIAGNOSIS — R531 Weakness: Secondary | ICD-10-CM | POA: Diagnosis not present

## 2022-11-21 DIAGNOSIS — Z7189 Other specified counseling: Secondary | ICD-10-CM | POA: Diagnosis not present

## 2022-11-21 DIAGNOSIS — Z515 Encounter for palliative care: Secondary | ICD-10-CM

## 2022-11-21 DIAGNOSIS — E440 Moderate protein-calorie malnutrition: Secondary | ICD-10-CM | POA: Diagnosis present

## 2022-11-21 DIAGNOSIS — E782 Mixed hyperlipidemia: Secondary | ICD-10-CM | POA: Diagnosis present

## 2022-11-21 DIAGNOSIS — R000 Tachycardia, unspecified: Secondary | ICD-10-CM | POA: Diagnosis present

## 2022-11-21 DIAGNOSIS — I5022 Chronic systolic (congestive) heart failure: Secondary | ICD-10-CM | POA: Diagnosis present

## 2022-11-21 DIAGNOSIS — Z888 Allergy status to other drugs, medicaments and biological substances status: Secondary | ICD-10-CM

## 2022-11-21 DIAGNOSIS — J9601 Acute respiratory failure with hypoxia: Secondary | ICD-10-CM | POA: Diagnosis not present

## 2022-11-21 LAB — GLUCOSE, CAPILLARY
Glucose-Capillary: 132 mg/dL — ABNORMAL HIGH (ref 70–99)
Glucose-Capillary: 143 mg/dL — ABNORMAL HIGH (ref 70–99)
Glucose-Capillary: 99 mg/dL (ref 70–99)

## 2022-11-21 LAB — COMPREHENSIVE METABOLIC PANEL
ALT: 21 U/L (ref 0–44)
AST: 24 U/L (ref 15–41)
Albumin: 3.6 g/dL (ref 3.5–5.0)
Alkaline Phosphatase: 76 U/L (ref 38–126)
Anion gap: 10 (ref 5–15)
BUN: 16 mg/dL (ref 6–20)
CO2: 27 mmol/L (ref 22–32)
Calcium: 9.9 mg/dL (ref 8.9–10.3)
Chloride: 103 mmol/L (ref 98–111)
Creatinine, Ser: 0.68 mg/dL (ref 0.61–1.24)
GFR, Estimated: >60 mL/min (ref >60)
Glucose, Bld: 113 mg/dL — ABNORMAL HIGH (ref 70–99)
Potassium: 3.9 mmol/L (ref 3.5–5.1)
Sodium: 140 mmol/L (ref 135–145)
Total Bilirubin: 0.5 mg/dL (ref 0.3–1.2)
Total Protein: 9.4 g/dL — ABNORMAL HIGH (ref 6.5–8.1)

## 2022-11-21 LAB — CBC WITH DIFFERENTIAL/PLATELET
Abs Immature Granulocytes: 0.02 10*3/uL (ref 0.00–0.07)
Basophils Absolute: 0 10*3/uL (ref 0.0–0.1)
Basophils Relative: 0 %
Eosinophils Absolute: 0.3 10*3/uL (ref 0.0–0.5)
Eosinophils Relative: 3 %
HCT: 38.5 % — ABNORMAL LOW (ref 39.0–52.0)
Hemoglobin: 11.8 g/dL — ABNORMAL LOW (ref 13.0–17.0)
Immature Granulocytes: 0 %
Lymphocytes Relative: 29 %
Lymphs Abs: 3.3 10*3/uL (ref 0.7–4.0)
MCH: 28.9 pg (ref 26.0–34.0)
MCHC: 30.6 g/dL (ref 30.0–36.0)
MCV: 94.1 fL (ref 80.0–100.0)
Monocytes Absolute: 0.6 10*3/uL (ref 0.1–1.0)
Monocytes Relative: 5 %
Neutro Abs: 7.2 10*3/uL (ref 1.7–7.7)
Neutrophils Relative %: 63 %
Platelets: 237 10*3/uL (ref 150–400)
RBC: 4.09 MIL/uL — ABNORMAL LOW (ref 4.22–5.81)
RDW: 15.8 % — ABNORMAL HIGH (ref 11.5–15.5)
WBC: 11.5 10*3/uL — ABNORMAL HIGH (ref 4.0–10.5)
nRBC: 0 % (ref 0.0–0.2)

## 2022-11-21 LAB — LACTIC ACID, PLASMA
Lactic Acid, Venous: 1.1 mmol/L (ref 0.5–1.9)
Lactic Acid, Venous: 0.9 mmol/L (ref 0.5–1.9)

## 2022-11-21 LAB — TROPONIN I (HIGH SENSITIVITY)
Troponin I (High Sensitivity): 14 ng/L (ref <18)
Troponin I (High Sensitivity): 12 ng/L (ref <18)

## 2022-11-21 LAB — MAGNESIUM
Magnesium: 1.8 mg/dL (ref 1.7–2.4)
Magnesium: 2.2 mg/dL (ref 1.7–2.4)

## 2022-11-21 LAB — PHOSPHORUS
Phosphorus: 3.5 mg/dL (ref 2.5–4.6)
Phosphorus: 4.1 mg/dL (ref 2.5–4.6)

## 2022-11-21 LAB — BRAIN NATRIURETIC PEPTIDE: B Natriuretic Peptide: 127.3 pg/mL — ABNORMAL HIGH (ref 0.0–100.0)

## 2022-11-21 MED ORDER — SODIUM CHLORIDE 0.9 % IV BOLUS: 1000.0000 mL | Freq: Once | INTRAVENOUS | Status: DC

## 2022-11-21 MED ORDER — FREE WATER
100.0000 mL | Status: DC
  Administered 2022-11-21 – 2022-11-27 (×34): 100 mL

## 2022-11-21 MED ORDER — IPRATROPIUM-ALBUTEROL 0.5-2.5 (3) MG/3ML IN SOLN
3.0000 mL | Freq: Once | RESPIRATORY_TRACT | Status: AC
  Administered 2022-11-21: 3 mL via RESPIRATORY_TRACT
  Filled 2022-11-21: qty 3

## 2022-11-21 MED ORDER — ACETAMINOPHEN 650 MG RE SUPP: 650.0000 mg | Freq: Four times a day (QID) | RECTAL | Status: DC | PRN

## 2022-11-21 MED ORDER — SODIUM CHLORIDE 0.9 % IV SOLN
2.0000 g | Freq: Once | INTRAVENOUS | Status: AC
  Administered 2022-11-21: 2 g via INTRAVENOUS
  Filled 2022-11-21: qty 12.5

## 2022-11-21 MED ORDER — OXYCODONE HCL 5 MG PO TABS
5.0000 mg | ORAL_TABLET | Freq: Four times a day (QID) | ORAL | Status: DC | PRN
  Administered 2022-11-21 (×2): 5 mg via ORAL
  Filled 2022-11-21 (×2): qty 1

## 2022-11-21 MED ORDER — LACTATED RINGERS IV SOLN: INTRAVENOUS | Status: AC

## 2022-11-21 MED ORDER — PROCHLORPERAZINE EDISYLATE 10 MG/2ML IJ SOLN: 10.0000 mg | Freq: Four times a day (QID) | INTRAMUSCULAR | Status: DC | PRN

## 2022-11-21 MED ORDER — VANCOMYCIN HCL IN DEXTROSE 1-5 GM/200ML-% IV SOLN
1000.0000 mg | Freq: Two times a day (BID) | INTRAVENOUS | Status: DC
  Administered 2022-11-21 – 2022-11-24 (×6): 1000 mg via INTRAVENOUS
  Filled 2022-11-21 (×6): qty 200

## 2022-11-21 MED ORDER — MAGNESIUM SULFATE 2 GM/50ML IV SOLN
2.0000 g | Freq: Once | INTRAVENOUS | Status: AC
  Administered 2022-11-21: 2 g via INTRAVENOUS
  Filled 2022-11-21: qty 50

## 2022-11-21 MED ORDER — IOHEXOL 350 MG/ML SOLN
75.0000 mL | Freq: Once | INTRAVENOUS | Status: AC | PRN
  Administered 2022-11-21: 75 mL via INTRAVENOUS

## 2022-11-21 MED ORDER — SODIUM CHLORIDE 0.9 % IV SOLN
2.0000 g | Freq: Three times a day (TID) | INTRAVENOUS | Status: DC
  Administered 2022-11-21 – 2022-11-23 (×7): 2 g via INTRAVENOUS
  Filled 2022-11-21 (×7): qty 12.5

## 2022-11-21 MED ORDER — ACETAMINOPHEN 160 MG/5ML PO SOLN
1000.0000 mg | Freq: Once | ORAL | Status: AC
  Administered 2022-11-21: 1000 mg via ORAL
  Filled 2022-11-21: qty 40.6

## 2022-11-21 MED ORDER — METRONIDAZOLE 500 MG/100ML IV SOLN
500.0000 mg | Freq: Once | INTRAVENOUS | Status: AC
  Administered 2022-11-21: 500 mg via INTRAVENOUS
  Filled 2022-11-21: qty 100

## 2022-11-21 MED ORDER — HYDROMORPHONE HCL 1 MG/ML IJ SOLN
1.0000 mg | Freq: Once | INTRAMUSCULAR | Status: DC
  Filled 2022-11-21: qty 1

## 2022-11-21 MED ORDER — HYDROMORPHONE HCL 2 MG PO TABS
8.0000 mg | ORAL_TABLET | ORAL | Status: DC | PRN
  Administered 2022-11-22 (×2): 8 mg via ORAL
  Filled 2022-11-21 (×2): qty 4

## 2022-11-21 MED ORDER — FENTANYL CITRATE PF 50 MCG/ML IJ SOSY
50.0000 ug | PREFILLED_SYRINGE | Freq: Once | INTRAMUSCULAR | Status: AC
  Administered 2022-11-21: 50 ug via INTRAVENOUS
  Filled 2022-11-21: qty 1

## 2022-11-21 MED ORDER — VANCOMYCIN HCL IN DEXTROSE 1-5 GM/200ML-% IV SOLN
1000.0000 mg | Freq: Once | INTRAVENOUS | Status: AC
  Administered 2022-11-21: 1000 mg via INTRAVENOUS
  Filled 2022-11-21: qty 200

## 2022-11-21 MED ORDER — IPRATROPIUM-ALBUTEROL 0.5-2.5 (3) MG/3ML IN SOLN: 3.0000 mL | Freq: Four times a day (QID) | RESPIRATORY_TRACT | Status: DC | PRN

## 2022-11-21 MED ORDER — IPRATROPIUM-ALBUTEROL 0.5-2.5 (3) MG/3ML IN SOLN
3.0000 mL | Freq: Four times a day (QID) | RESPIRATORY_TRACT | Status: DC
  Administered 2022-11-21 – 2022-11-22 (×4): 3 mL via RESPIRATORY_TRACT
  Filled 2022-11-21 (×4): qty 3

## 2022-11-21 MED ORDER — ACETAMINOPHEN 325 MG PO TABS: 650.0000 mg | ORAL_TABLET | Freq: Four times a day (QID) | ORAL | Status: DC | PRN

## 2022-11-21 MED ORDER — LACTATED RINGERS IV BOLUS
500.0000 mL | Freq: Once | INTRAVENOUS | Status: AC
  Administered 2022-11-21: 500 mL via INTRAVENOUS

## 2022-11-21 MED ORDER — SODIUM CHLORIDE 0.9 % IV BOLUS
1000.0000 mL | Freq: Once | INTRAVENOUS | Status: AC
  Administered 2022-11-21: 1000 mL via INTRAVENOUS

## 2022-11-21 MED ORDER — FENTANYL CITRATE PF 50 MCG/ML IJ SOSY
12.5000 ug | PREFILLED_SYRINGE | INTRAMUSCULAR | Status: DC | PRN
  Administered 2022-11-21 – 2022-11-22 (×3): 12.5 ug via INTRAVENOUS
  Filled 2022-11-21 (×3): qty 1

## 2022-11-21 MED ORDER — FENTANYL CITRATE PF 50 MCG/ML IJ SOSY: 12.5000 ug | PREFILLED_SYRINGE | INTRAMUSCULAR | Status: DC | PRN

## 2022-11-21 MED ORDER — OSMOLITE 1.5 CAL PO LIQD
1000.0000 mL | ORAL | Status: DC
  Administered 2022-11-21 – 2022-11-26 (×6): 1000 mL
  Filled 2022-11-21 (×8): qty 1000

## 2022-11-21 MED ORDER — SODIUM CHLORIDE (PF) 0.9 % IJ SOLN
INTRAMUSCULAR | Status: AC
  Filled 2022-11-21: qty 50

## 2022-11-21 MED ORDER — METRONIDAZOLE 500 MG/100ML IV SOLN
500.0000 mg | Freq: Two times a day (BID) | INTRAVENOUS | Status: DC
  Administered 2022-11-21 – 2022-11-23 (×4): 500 mg via INTRAVENOUS
  Filled 2022-11-21 (×4): qty 100

## 2022-11-21 NOTE — H&P (Signed)
History and Physical    Patient: Ronnie [name] WRU:[id number] DOB: 1976/03/26 DOA: 10/29/2022 DOS: the patient was seen and examined on 11/11/2022 PCP: Claiborne Rigg, NP  Patient coming from: Home  Chief Complaint:  Chief Complaint  Patient presents with   Shortness of Breath   HPI: Ronnie [name] is a 47 y.o. male with medical history significant of hypertension, muscular dystrophy, chronic respiratory failure with hypercapnia, chronic systolic heart failure, hyperlipidemia, normocytic anemia, tobacco dependence in remission, cocaine abuse in remission, alcohol abuse in remission sleep apnea, history of AKI who was recently diagnosed, admitted on November 05, 2022 and discharged 2 days ago from [facility] due to pneumonia and underwent a PEG tube placed due to aspiration.  He has been n.p.o. since he was discharged.  He complained his breathing got a lot worse after he went to bed.  He has been expectorating mostly clear sputum and at times his cough is nonproductive.  Him and his wife would like to request a hospital bed. He denied fever, chills, rhinorrhea, sore throat, wheezing or hemoptysis.  No chest pain, palpitations, diaphoresis, PND, orthopnea or pitting edema of the lower extremities.  No abdominal pain, nausea, emesis, diarrhea, constipation, melena or hematochezia.  No flank pain, dysuria, frequency or hematuria.  No polyuria, polydipsia, polyphagia or blurred vision.   Lab work: Her CBC showed a white count 11.5, hemoglobin 11.8 g/dL platelets 914.  BNP 127.3 pg/mL.  Lactic acid x 2 and troponin x 2 normal.  CMP showed a glucose of 113 mg/dL and total protein 9.4 g/dL.  The rest of the CMP measurements were normal.  Imaging: Portable 1 view chest radiograph show increased confluence of the right lower lobe collapse or consolidation and small right pleural effusion since 10/29/2022.  CTA chest was negative for PE.  There is complete right lower lobe consolidation that has progressed since CTA  earlier this month.  There is a superimposed right middle lobe bronchopneumonia that is only mildly improved since 10/25/2022.  Minimal right pleural effusion.  Increased left lower lobe opacity but more resembles atelectasis than infection.  Emphysema.  ED course: Initial vital signs were temperature 98.2 F, pulse 125, respirations 25, BP 124/95 mmHg O2 sat 94% on room air.  The patient received 500 mL LR bolus x 2, a DuoNeb, cefepime, metronidazole and vancomycin.   Review of Systems: As mentioned in the history of present illness. All other systems reviewed and are negative. Past Medical History:  Diagnosis Date   Hypertension    Muscular dystrophy (HCC)    Substance abuse (HCC)    Past Surgical History:  Procedure Laterality Date   NO PAST SURGERIES     OTHER SURGICAL HISTORY     No surgical history    Social History:  reports that he has been smoking cigarettes. He has a 12.50 pack-year smoking history. He has never used smokeless tobacco. He reports current alcohol use of about 6.0 standard drinks of alcohol per week. He reports that he does not currently use drugs after having used the following drugs: Cocaine.  Allergies  Allergen Reactions   Lisinopril Cough    Family History  Problem Relation Age of Onset   Heart disease Mother    Heart disease Maternal Grandmother     Prior to Admission medications   Medication Sig Start Date End Date Taking? Authorizing Provider  fentaNYL (SUBLIMAZE) SOLN Inject 50-100 mcg into the vein every 30 (thirty) minutes as needed (sob). 10/31/22   Shelby Mattocks, DO  fentaNYL 10 mcg/ml SOLN infusion Inject 100-200 mcg/hr into the vein continuous. 10/31/22   Shelby Mattocks, DO  glycopyrrolate (ROBINUL) 0.2 MG/ML injection Inject 2 mLs (0.4 mg total) into the vein 4 (four) times daily. 10/31/22   Shelby Mattocks, DO  ipratropium-albuterol (DUONEB) 0.5-2.5 (3) MG/3ML SOLN Take 3 mLs by nebulization every 8 (eight) hours as needed. 10/31/22   Shelby Mattocks, DO  lidocaine (LIDODERM) 5 % Place 1 patch onto the skin daily. Remove & Discard patch within 12 hours or as directed by MD 10/31/22   Shelby Mattocks, DO  LORazepam (ATIVAN) 0.5 MG tablet Take 1 tablet (0.5 mg total) by mouth every 4 (four) hours as needed for anxiety. 10/31/22   Shelby Mattocks, DO  LORazepam (ATIVAN) 2 MG/ML injection Inject 0.25 mLs (0.5 mg total) into the vein every 4 (four) hours as needed for anxiety. 10/31/22   Shelby Mattocks, DO  metoprolol tartrate (LOPRESSOR) 5 MG/5ML SOLN injection Inject 2.5 mLs (2.5 mg total) into the vein every 12 (twelve) hours. 10/31/22   Shelby Mattocks, DO  polyethylene glycol (MIRALAX / GLYCOLAX) 17 g packet Take 17 g by mouth daily as needed for moderate constipation. 10/31/22   Shelby Mattocks, DO    Physical Exam: Vitals:   11/20/2022 0330 10/29/2022 0400 11/12/2022 0500 10/30/2022 0518  BP: 109/89 (!) 121/91  113/88  Pulse: (!) 116 (!) 124  (!) 129  Resp: (!) 24 (!) 21  20  Temp:      SpO2: 97% 100%  98%  Weight:   63.5 kg    Physical Exam Vitals and nursing note reviewed.  Constitutional:      General: He is awake. He is not in acute distress. HENT:     Head: Normocephalic.     Nose: No rhinorrhea.     Mouth/Throat:     Mouth: Mucous membranes are dry.  Eyes:     General: No scleral icterus.    Pupils: Pupils are equal, round, and reactive to light.  Neck:     Vascular: No JVD.  Cardiovascular:     Rate and Rhythm: Regular rhythm. Tachycardia present.     Heart sounds: S1 normal and S2 normal.  Pulmonary:     Breath sounds: Examination of the right-middle field reveals decreased breath sounds. Examination of the right-lower field reveals decreased breath sounds and rales. Examination of the left-lower field reveals decreased breath sounds. Decreased breath sounds and rales present. No wheezing or rhonchi.  Abdominal:     General: The ostomy site is clean. Bowel sounds are normal.     Palpations: Abdomen is soft.     Tenderness: There  is no abdominal tenderness.     Comments: PEG tube in place.  Musculoskeletal:     Cervical back: Neck supple.     Right lower leg: No edema.     Left lower leg: No edema.  Skin:    General: Skin is warm and dry.  Neurological:     Mental Status: He is alert and oriented to person, place, and time. Mental status is at baseline.  Psychiatric:        Mood and Affect: Mood normal.        Behavior: Behavior normal. Behavior is cooperative.   Data Reviewed:  Results are pending, will review when available.  10/26/2022 transthoracic echocardiogram IMPRESSIONS:   1. Left ventricular ejection fraction, by estimation, is 35 to 40%. The  left ventricle has moderately decreased function. The left ventricle  demonstrates global  hypokinesis. Left ventricular diastolic parameters are  indeterminate.   2. Right ventricular systolic function is normal. The right ventricular  size is normal. Tricuspid regurgitation signal is inadequate for assessing  PA pressure.   3. The mitral valve is normal in structure. Mild mitral valve  regurgitation. No evidence of mitral stenosis.   4. The aortic valve is grossly normal. Aortic valve regurgitation is not  visualized. No aortic stenosis is present.   5. The inferior vena cava is normal in size with greater than 50%  respiratory variability, suggesting right atrial pressure of 3 mmHg.   Comparison(s): No significant change from prior study. Prior images  reviewed side by side.   EKG: Vent. rate 124 BPM PR interval 130 ms QRS duration 85 ms QT/QTcB 338/486 ms P-R-T axes 14 38 203 Sinus tachycardia Probable left atrial enlargement Abnormal R-wave progression, early transition Probable LVH with secondary repol abnrm Borderline prolonged QT interval  Assessment and Plan: Principal Problem:   HCAP (healthcare-associated pneumonia) In the setting of:   Aspiration into lower respiratory tract Superimposed on:   Chronic respiratory failure with  hypercapnia (HCC) Admit to PCU/inpatient. Supplemental oxygen as needed. Scheduled and as needed bronchodilators. Continue cefepime 2 g every 8 hours.   Continue metronidazole 500 mg IVPB q 12 hr. Continue vancomycin per pharmacy. Follow-up blood culture and sensitivity Follow CBC and CMP in a.m. Consider pulmonary evaluation if no improvement.  Active Problems:   PEG (percutaneous endoscopic gastrostomy) status (HCC)  Per patient, has not had nutrition since he was in the hospital. He looks volume depleted, but not dehydrated. Time-limited IV hydration.    Chronic systolic heart failure (HCC) No signs of volume overload.    Prolonged QT interval  Avoid QT prolonging meds as possible. KCl supplementation as needed. Magnesium sulfate 2 g IVPB now. Keep electrolytes optimized. Check EKG in the morning.    Essential hypertension Blood pressures are soft. Will hold metoprolol for now.    Muscular dystrophy (HCC) Supportive care. Glycopyrrolate as needed. MiraLAX as needed for constipation. The patient rescinded his hospice status.    Mixed hyperlipidemia Currently not on medical therapy. Follow-up with PCP for therapy options.    Tobacco dependence due to cigarettes Per patient, he stopped smoking a month ago.    Normocytic anemia Normal B12 and folate 9 years ago. Monitor hematocrit and hemoglobin. Transfuse as needed.      Advance Care Planning:   Code Status: DNR.  Rescinded hospice status.  Consults:   Family Communication: Spoke to his wife over the phone.  Severity of Illness: The appropriate patient status for this patient is INPATIENT. Inpatient status is judged to be reasonable and necessary in order to provide the required intensity of service to ensure the patient's safety. The patient's presenting symptoms, physical exam findings, and initial radiographic and laboratory data in the context of their chronic comorbidities is felt to place them at high  risk for further clinical deterioration. Furthermore, it is not anticipated that the patient will be medically stable for discharge from the hospital within 2 midnights of admission.   * I certify that at the point of admission it is my clinical judgment that the patient will require inpatient hospital care spanning beyond 2 midnights from the point of admission due to high intensity of service, high risk for further deterioration and high frequency of surveillance required.*  Author: Bobette Mo, MD 11/14/2022 6:25 AM  For on call review [email or web address].   This document was prepared  using Conservation officer, historic buildings and may contain some unintended transcription errors.

## 2022-11-21 NOTE — Plan of Care (Signed)
  Problem: Elimination: Goal: Will not experience complications related to urinary retention Outcome: Progressing   Problem: Pain Managment: Goal: General experience of comfort will improve Outcome: Progressing   Problem: Safety: Goal: Ability to remain free from injury will improve Outcome: Progressing   Problem: Activity: Goal: Ability to tolerate increased activity will improve Outcome: Progressing   Problem: Clinical Measurements: Goal: Ability to maintain a body temperature in the normal range will improve Outcome: Progressing   Problem: Respiratory: Goal: Ability to maintain adequate ventilation will improve Outcome: Progressing Goal: Ability to maintain a clear airway will improve Outcome: Progressing

## 2022-11-21 NOTE — Progress Notes (Signed)
[facility]) Hospital Liaison Note  This is a pending outpatient palliative care patient with [facility].  [facility] hospital liaison will continue to follow for discharge disposition.  Please call with any outpatient based palliative care related questions or concerns.  Thank you, Haynes Bast, BSN, RN [facility] Liaison [phone]

## 2022-11-21 NOTE — ED Triage Notes (Signed)
Pt. BIB [facility] for SOB. He was recently diagnosed with Pneumonia. Pt. 93% on RA. Hx of muscular dystrophy.

## 2022-11-21 NOTE — Progress Notes (Addendum)
  Carryover admission to the Day Admitter.  I discussed this case with the EDP, Dr. Manus Gunning.  Per these discussions:   This is a 47 year old male with muscular dystrophy, chronic systolic heart failure with EF 35%, who is being admitted with HCAP pneumonia, potential aspiration, complicated by acute Evoxac respiratory failure.  He was hospitalized during early June at [facility] for similar, before being discharged on hospice.  He subsequently waived hospice and was admitted recently at [facility], from where he was discharged 2 days ago.  Discharge summary pending from [facility].   He presents this evening with 1 to 2 days of progressive shortness of breath, with chest x-ray demonstrating right lower lobe airspace opacity concerning for pneumonia.  CT chest pending.  In the absence of any baseline supplemental oxygen requirements, his initial oxygen saturations were in the high 80s, now with O2 sats in the high 90s on 3 to 4 L nasal cannula.  He is tachycardic, but afebrile.  Denies any associated chest pain.  Was started on broad-spectrum IV antibiotics in the ED this evening, including IV vancomycin, cefepime, and Flagyl.  Of note, in the setting of his muscular dystrophy, feeding tube was placed at [facility], and he is now currently n.p.o., but is potentially aspirating in spite of this.  When given the option of being admitted here versus trying to admit back to [facility], the patient elected to stay here.  I have placed an order for inpatient admission to PCU for further evaluation management of the above.  May need additional goals of care clarification.  I have placed some additional preliminary admit orders via the adult multi-morbid admission order set.  I will defer additional decision making regarding additional antibiotics to the admitting hospitalist.    Newton Pigg, DO Hospitalist

## 2022-11-21 NOTE — Progress Notes (Signed)
Pharmacy Antibiotic Note  Ronnie [name] is a 47 y.o. male admitted on 11/03/2022 with recently diagnosed with Pneumonia. Pt. 93% on RA. Hx of muscular dystrophy. .  Pharmacy has been consulted for vancomycin dosing.  Plan: Vancomycin 1gm IV x 1 then 1gm q12h (AUC 493.8, used Scr 0.8, TBW) Follow renal function, cultures and clinical course  Weight: 63.5 kg (139 lb 15.9 oz)  Temp (24hrs), Avg:98.2 F (36.8 C), Min:98.2 F (36.8 C), Max:98.2 F (36.8 C)  Recent Labs  Lab 11/20/2022 0312  WBC 11.5*  CREATININE 0.68  LATICACIDVEN 1.1    Estimated Creatinine Clearance: 101.4 mL/min (by C-G formula based on SCr of 0.68 mg/dL).    Allergies  Allergen Reactions   Lisinopril Cough    Antimicrobials this admission: 6/28 vanc >> 6/28 cefepime x 1  Dose adjustments this admission:   Microbiology results: 6/28 BCx:   Thank you for allowing pharmacy to be a part of this patient's care.  Arley Phenix RPh 11/10/2022, 5:12 AM

## 2022-11-21 NOTE — ED Notes (Signed)
ED TO INPATIENT HANDOFF REPORT  ED Nurse Name and Phone #: Dessa Phi  S Name/Age/Gender Ronnie [name] 47 y.o. male Room/Bed: WA11/WA11  Code Status   Code Status: DNR  Home/SNF/Other Home Patient oriented to: self, place, time, and situation Is this baseline? Yes   Triage Complete: Triage complete  Chief Complaint HCAP (healthcare-associated pneumonia) [J18.9]  Triage Note Pt. BIB [facility] for SOB. He was recently diagnosed with Pneumonia. Pt. 93% on RA. Hx of muscular dystrophy.   Allergies Allergies  Allergen Reactions   Lisinopril Cough    Level of Care/Admitting Diagnosis ED Disposition     ED Disposition  Admit   Condition  --   Comment  Hospital Area: [facility] [[id number]]  Level of Care: Progressive [102]  Admit to Progressive based on following criteria: MULTISYSTEM THREATS such as stable sepsis, metabolic/electrolyte imbalance with or without encephalopathy that is responding to early treatment.  May admit patient to [facility] or [facility] if equivalent level of care is available:: No  Covid Evaluation: Asymptomatic - no recent exposure (last 10 days) testing not required  Diagnosis: HCAP (healthcare-associated pneumonia) [161096]  Admitting Physician: Angie Fava [[id number]]  Attending Physician: Angie Fava [[id number]]  Certification:: I certify this patient will need inpatient services for at least 2 midnights  Estimated Length of Stay: 2          B Medical/Surgery History Past Medical History:  Diagnosis Date   Hypertension    Muscular dystrophy (HCC)    Substance abuse (HCC)    Past Surgical History:  Procedure Laterality Date   NO PAST SURGERIES     OTHER SURGICAL HISTORY     No surgical history      A IV Location/Drains/Wounds Patient Lines/Drains/Airways Status     Active Line/Drains/Airways     Name Placement date Placement time Site Days   Peripheral IV 11/08/2022 18 G Right Antecubital  11/03/2022  0312  Antecubital  less than 1   External Urinary Catheter 10/27/22  2113  --  25            Intake/Output Last 24 hours No intake or output data in the 24 hours ending 10/29/2022 1434  Labs/Imaging Results for orders placed or performed during the hospital encounter of 11/13/2022 (from the past 48 hour(s))  Lactic acid, plasma     Status: None   Collection Time: 11/14/2022  3:12 AM  Result Value Ref Range   Lactic Acid, Venous 1.1 0.5 - 1.9 mmol/L    Comment: Performed at [facility], [address] [place], [place] [address]  CBC with Differential     Status: Abnormal   Collection Time: 10/28/2022  3:12 AM  Result Value Ref Range   WBC 11.5 (H) 4.0 - 10.5 K/uL   RBC 4.09 (L) 4.22 - 5.81 MIL/uL   Hemoglobin 11.8 (L) 13.0 - 17.0 g/dL   HCT 13.0 (L) 86.5 - 78.4 %   MCV 94.1 80.0 - 100.0 fL   MCH 28.9 26.0 - 34.0 pg   MCHC 30.6 30.0 - 36.0 g/dL   RDW 69.6 (H) 29.5 - 28.4 %   Platelets 237 150 - 400 K/uL   nRBC 0.0 0.0 - 0.2 %   Neutrophils Relative % 63 %   Neutro Abs 7.2 1.7 - 7.7 K/uL   Lymphocytes Relative 29 %   Lymphs Abs 3.3 0.7 - 4.0 K/uL   Monocytes Relative 5 %   Monocytes Absolute 0.6 0.1 -  1.0 K/uL   Eosinophils Relative 3 %   Eosinophils Absolute 0.3 0.0 - 0.5 K/uL   Basophils Relative 0 %   Basophils Absolute 0.0 0.0 - 0.1 K/uL   Immature Granulocytes 0 %   Abs Immature Granulocytes 0.02 0.00 - 0.07 K/uL    Comment: Performed at [facility], [address], [place], [place] [address]  Comprehensive metabolic panel     Status: Abnormal   Collection Time: 11/14/2022  3:12 AM  Result Value Ref Range   Sodium 140 135 - 145 mmol/L   Potassium 3.9 3.5 - 5.1 mmol/L   Chloride 103 98 - 111 mmol/L   CO2 27 22 - 32 mmol/L   Glucose, Bld 113 (H) 70 - 99 mg/dL    Comment: Glucose reference range applies only to samples taken after fasting for at least 8 hours.   BUN 16 6 - 20 mg/dL   Creatinine, Ser 6.04 0.61 - 1.24 mg/dL    Calcium 9.9 8.9 - 54.0 mg/dL   Total Protein 9.4 (H) 6.5 - 8.1 g/dL   Albumin 3.6 3.5 - 5.0 g/dL   AST 24 15 - 41 U/L   ALT 21 0 - 44 U/L   Alkaline Phosphatase 76 38 - 126 U/L   Total Bilirubin 0.5 0.3 - 1.2 mg/dL   GFR, Estimated >98 >11 mL/min    Comment: (NOTE) Calculated using the CKD-EPI Creatinine Equation (2021)    Anion gap 10 5 - 15    Comment: Performed at [facility] [place], [address] [place], [place] [address]  Troponin I (High Sensitivity)     Status: None   Collection Time: 11/17/2022  3:12 AM  Result Value Ref Range   Troponin I (High Sensitivity) 14 <18 ng/L    Comment: (NOTE) Elevated high sensitivity troponin I (hsTnI) values and significant  changes across serial measurements may suggest ACS but many other  chronic and acute conditions are known to elevate hsTnI results.  Refer to the "Links" section for chest pain algorithms and additional  guidance. Performed at [facility], [address] [place], [place] [address]   Brain natriuretic peptide     Status: Abnormal   Collection Time: 10/29/2022  3:12 AM  Result Value Ref Range   B Natriuretic Peptide 127.3 (H) 0.0 - 100.0 pg/mL    Comment: Performed at [facility], [address] [place], [place] [address]  Lactic acid, plasma     Status: None   Collection Time: 10/31/2022  5:24 AM  Result Value Ref Range   Lactic Acid, Venous 0.9 0.5 - 1.9 mmol/L    Comment: Performed at [facility], [address], [place], [place] [address]  Troponin I (High Sensitivity)     Status: None   Collection Time: 10/28/2022  5:24 AM  Result Value Ref Range   Troponin I (High Sensitivity) 12 <18 ng/L    Comment: (NOTE) Elevated high sensitivity troponin I (hsTnI) values and significant  changes across serial measurements may suggest ACS but many other  chronic and acute conditions are known to elevate hsTnI results.  Refer to the "Links" section for  chest pain algorithms and additional  guidance. Performed at [facility], [address] [place], [place] [address]   Magnesium     Status: None   Collection Time: 11/08/2022  5:24 AM  Result Value Ref Range   Magnesium 1.8 1.7 - 2.4 mg/dL  Comment: Performed at [facility], [address], [place], [place] [address]  Phosphorus     Status: None   Collection Time: 11/06/2022  5:24 AM  Result Value Ref Range   Phosphorus 3.5 2.5 - 4.6 mg/dL    Comment: Performed at [facility], [address] [place], [place] [address]   CT Angio Chest PE W and/or Wo Contrast  Result Date: 11/18/2022 CLINICAL DATA:  47 year old male with shortness of breath. Multilobar pneumonia earlier this month. EXAM: CT ANGIOGRAPHY CHEST WITH CONTRAST TECHNIQUE: Multidetector CT imaging of the chest was performed using the standard protocol during bolus administration of intravenous contrast. Multiplanar CT image reconstructions and MIPs were obtained to evaluate the vascular anatomy. RADIATION DOSE REDUCTION: This exam was performed according to the departmental dose-optimization program which includes automated exposure control, adjustment of the mA and/or kV according to patient size and/or use of iterative reconstruction technique. CONTRAST:  75mL OMNIPAQUE IOHEXOL 350 MG/ML SOLN COMPARISON:  Portable chest x-ray this morning. Chest CT without contrast 10/25/2022. FINDINGS: Cardiovascular: Excellent contrast bolus timing in the pulmonary arterial tree. No pulmonary artery filling defect. Heart size is stable. No pericardial effusion. Early contrast in the aorta. Mild Calcified aortic atherosclerosis. Mediastinum/Nodes: No discrete mediastinal mass or lymphadenopathy. Lungs/Pleura: Layering secretions in the trachea, right mainstem bronchus, and the distal bronchus intermedius is opacified by retained or aspirated material (series 10, image 153). Complete right lower  lobe consolidation is substantially progressed since the CT on 10/25/2022. Only trace superimposed right pleural effusion is present, and mostly tracking in the fissure. Superimposed upper lobe predominant emphysema, mostly paraseptal. Right middle lobe lateral segment peribronchial patchy and sub solid opacity is only mildly improved since 10/25/2022. Left lung major airways remain patent. Increased left lower lobe streaky peribronchial opacity, but mostly enhancing and more resembles atelectasis than pneumonia. No left pleural effusion. Upper Abdomen: Percutaneous gastrostomy tube now in place. Negative visible liver, gallbladder, spleen, pancreas, adrenal glands and kidneys. No dilated bowel, free air or free fluid in the visible upper abdomen. Musculoskeletal: No acute osseous abnormality identified. Review of the MIP images confirms the above findings. IMPRESSION: 1. Negative for pulmonary embolus. 2. Complete Right Lower Lobe Consolidation has progressed since the CT on 10/25/2022, and is again associated with aspirated or retained secretions in the airway. Recurrent Aspiration not excluded. 3. Superimposed right middle lobe Bronchopneumonia is only mildly improved since 10/25/2022. Minimal right pleural effusion. 4. Increased left lower lobe opacity but more resembles atelectasis than infection. 5.  Emphysema (ICD10-J43.9). Electronically Signed   By: Odessa Fleming M.D.   On: 11/01/2022 05:19   DG Chest Portable 1 View  Result Date: 10/30/2022 CLINICAL DATA:  47 year old male with shortness of breath. Multilobar right lung pneumonia earlier this month. EXAM: PORTABLE CHEST 1 VIEW COMPARISON:  Chest radiographs 10/29/2022, chest CT 10/25/2022 and earlier. FINDINGS: Portable AP semi upright view at 0356 hours. Increased confluence of right lower lobe collapse or consolidation plus evidence of small layering right pleural effusion. But otherwise lung volumes and ventilation not significantly changed from  10/29/2022. Upper lungs remain relatively clear. Stable cardiac size and mediastinal contours. Visualized tracheal air column is within normal limits. No pneumothorax. No pulmonary edema. No acute osseous abnormality identified. Percutaneous gastrostomy tube now visible in the left upper quadrant. IMPRESSION: Increased confluence of right lower lobe collapse or consolidation and small right pleural effusion since 10/29/2022. Electronically Signed   By: Odessa Fleming M.D.   On: 10/26/2022 04:46  Pending Labs Unresulted Labs (From admission, onward)     Start     Ordered   10/30/2022 0847  Strep pneumoniae urinary antigen  Once,   R        10/31/2022 0846   10/30/2022 0847  Expectorated Sputum Assessment w Gram Stain, Rflx to Resp Cult  Once,   R        11/22/2022 0846   10/25/2022 0253  Blood culture (routine x 2)  BLOOD CULTURE X 2,   R (with STAT occurrences)      11/09/2022 0255            Vitals/Pain Today's Vitals   10/25/2022 1058 11/23/2022 1136 11/18/2022 1240 11/07/2022 1330  BP:    110/88  Pulse:    (!) 113  Resp:    (!) 32  Temp:  98 F (36.7 C)    TempSrc:  Oral    SpO2: 98%   92%  Weight:      PainSc:   6      Isolation Precautions No active isolations  Medications Medications  vancomycin (VANCOCIN) IVPB 1000 mg/200 mL premix (has no administration in time range)  acetaminophen (TYLENOL) tablet 650 mg (has no administration in time range)    Or  acetaminophen (TYLENOL) suppository 650 mg (has no administration in time range)  ceFEPIme (MAXIPIME) 2 g in sodium chloride 0.9 % 100 mL IVPB (has no administration in time range)  metroNIDAZOLE (FLAGYL) IVPB 500 mg (has no administration in time range)  ipratropium-albuterol (DUONEB) 0.5-2.5 (3) MG/3ML nebulizer solution 3 mL (3 mLs Nebulization Not Given 11/06/2022 1100)  prochlorperazine (COMPAZINE) injection 10 mg (has no administration in time range)  oxyCODONE (Oxy IR/ROXICODONE) immediate release tablet 5 mg (5 mg Oral Given 10/30/2022  1219)  ipratropium-albuterol (DUONEB) 0.5-2.5 (3) MG/3ML nebulizer solution 3 mL (3 mLs Nebulization Given 11/16/2022 0348)  lactated ringers bolus 500 mL (0 mLs Intravenous Stopped 11/03/2022 0409)  iohexol (OMNIPAQUE) 350 MG/ML injection 75 mL (75 mLs Intravenous Contrast Given 11/06/2022 0455)  ceFEPIme (MAXIPIME) 2 g in sodium chloride 0.9 % 100 mL IVPB (0 g Intravenous Stopped 10/31/2022 0609)  metroNIDAZOLE (FLAGYL) IVPB 500 mg (0 mg Intravenous Stopped 10/27/2022 0751)  vancomycin (VANCOCIN) IVPB 1000 mg/200 mL premix (0 mg Intravenous Stopped 11/12/2022 0751)  lactated ringers bolus 500 mL (0 mLs Intravenous Stopped 11/06/2022 0751)  magnesium sulfate IVPB 2 g 50 mL (2 g Intravenous New Bag/Given 11/15/2022 1139)  sodium chloride 0.9 % bolus 1,000 mL (1,000 mLs Intravenous New Bag/Given 10/29/2022 1144)  fentaNYL (SUBLIMAZE) injection 50 mcg (50 mcg Intravenous Given 11/16/2022 1134)    Mobility non-ambulatory     Focused Assessments Cardiac Assessment Handoff:    Lab Results  Component Value Date   CKTOTAL 293 09/11/2016   TROPONINI <0.03 10/17/2018   No results found for: "DDIMER" Does the Patient currently have chest pain? No    R Recommendations: See Admitting Provider Note  Report given to:   Additional Notes:

## 2022-11-21 NOTE — Progress Notes (Incomplete)
    Patient Name: Ronnie [name]           DOB: 11/30/1975  MRN: [id number]      Admission Date: 10/30/2022  Attending Provider: Bobette Mo, MD  Primary Diagnosis: HCAP (healthcare-associated pneumonia)   Level of care: Progressive    CROSS COVER NOTE   Date of Service   11/02/2022   Ronnie [name], 46 y.o. male, was admitted on 11/20/2022 for HCAP (healthcare-associated pneumonia).    HPI/Events of Note   BP soft, 88/67 (74).  Asymptomatic. No acute changes.  Remains afebrile, but tachycardic 110-120s. No acute blood loss or GI loss. Appears dry Currently holding metoprolol.  Recently received oxycodone. Admitted for HCAP, last lactic acid level negative, WBC 11.5, receiving broad-spectrum antibiotics   Interventions/ Plan   Fluid bolus, 500 cc Continuous IVF x 6 hours, gentle hydration Given admission for HCAP infection, repeat lactic acid --> 1.3 Pain management with low-dose IV fentanyl given soft BP        Ronnie Harada, DNP, ACNPC- AG [facility] [employer]

## 2022-11-21 NOTE — ED Notes (Signed)
Ronnie [name] NIF was -20cm H2O.

## 2022-11-21 NOTE — Care Management (Signed)
Transition of Care [facility]) - Emergency Department Mini Assessment   Patient Details  Name: Ronnie [name] MRN: [id number] Date of Birth: 06/03/1975  [facility]) CM/SW Contact:    Lavenia Atlas, RN Phone Number: 11/14/2022, 3:27 PM   Clinical Narrative: Received message from [facility] w/ [employer] who is following patient for DME needs, which requires authorization.   Per chart review patient recently presented to [facility] for cough and chest congestion-lobar pneumonia. [facility] is following patient for palliative care, patient is a DNR with conservative treatments for comfort care. Patient is wanting a hospital bed. Patient is being admitted.   TOC will continue to follow for needs.    ED Mini Assessment: What brought you to the Emergency Department? : c/o shortness of breathe  Barriers to Discharge: Continued Medical Work up  [employer] interventions: none, patient being admitted     Interventions which prevented an admission or readmission: Other (must enter comment) (DME)    Patient Contact and Communications        ,                 Admission diagnosis:  HCAP (healthcare-associated pneumonia) [J18.9] Patient Active Problem List   Diagnosis Date Noted   HCAP (healthcare-associated pneumonia) 11/09/2022   Normocytic anemia 11/20/2022   Aspiration into lower respiratory tract 11/08/2022   PEG (percutaneous endoscopic gastrostomy) status (HCC) 11/08/2022   Prolonged QT interval 10/27/2022   Chronic respiratory failure with hypercapnia (HCC) 11/06/2022   Palliative care status 10/31/2022   Concern about end of life 10/30/2022   Muscular dystrophy (HCC) 10/27/2022   Chronic systolic heart failure (HCC) 10/27/2022   Dysphagia 10/27/2022   Lobar pneumonia, unspecified organism (HCC) 10/25/2022   Mixed hyperlipidemia 06/12/2022   Bulbar weakness (HCC) 11/09/2021   Erectile dysfunction 08/23/2021   Tobacco dependence due to cigarettes 06/22/2020   Family  history of early CAD 06/22/2020   LVH (left ventricular hypertrophy) 05/26/2019   Routine adult health maintenance 12/26/2016   Sleep apnea 12/26/2016   NICM (nonischemic cardiomyopathy) (HCC) 10/03/2016   Essential hypertension 10/03/2016   Encounter for orogastric (OG) tube placement    Altered mental state 09/11/2016   Acute kidney injury (HCC) 09/11/2016   Acute respiratory failure (HCC) 09/11/2016   Lactic acidosis 09/11/2016   Smoker 09/11/2016   ETOH abuse 09/11/2016   Cocaine abuse (HCC) 09/11/2016   Nondependent alcohol abuse, in remission 09/11/2016   PCP:  Claiborne Rigg, NP Pharmacy:   [id number] - [place], [place] - [address] AT [facility] OF [address]  [place] [address] Phone: [phone] Fax: [phone]

## 2022-11-21 NOTE — ED Notes (Signed)
Pt wife called asking for the nurse, nurse updated wife on pt status, pt is resting in bed.

## 2022-11-21 NOTE — Progress Notes (Signed)
Initial Nutrition Assessment  DOCUMENTATION CODES:   Not applicable  INTERVENTION:  - Per discussion with Dr. Robb Matar, can start tube feeds via PEG today. Plan to start at low rate and advance slowly, monitoring electrolytes and tolerance.  Initiate tube feeding via PEG: Osmolite 1.5 at 55 ml/h (1320 ml per day) *Start at 66mL/hr and advance by 10mL Q8H as tolerated Provides 1980 kcal, 83 gm protein, 1006 ml free water daily  - Monitor magnesium, potassium, and phosphorus BID for at least 3 days, MD to replete as needed, as pt is at risk for refeeding syndrome.  - Q4H free water flushes ( ) provides total of free water daily  - Monitor weight trends.   - Ordered new weight.   NUTRITION DIAGNOSIS:   Inadequate oral intake related to inability to eat as evidenced by NPO status.  GOAL:   Patient will meet greater than or equal to 90% of their needs  MONITOR:   Labs, Weight trends, TF tolerance  REASON FOR ASSESSMENT:   Consult Enteral/tube feeding initiation and management  ASSESSMENT:   47 y.o. male with PMH significant of HTN, muscular dystrophy, chronic respiratory failure with hypercapnia, CHF, HLD, normocytic anemia, alcohol abuse in remission, sleep apnea, history of AKI who was admitted on November 05, 2022 and discharged 2 days ago from [facility] due to pneumonia and had a PEG tube placed (6/18) due to aspiration. Presented to [facility] ED for SOB, admitted for HCAP.  Patient in ED all day. Information obtained via chart review.   Per EMR, no weight history since 2021 until this month. Patient weighed at 140# on 6/6 and weight this admission appears to be carried over from that time. Will order new weight. For now, will utilize weight from RD note of last weight taken at [facility] (58.5kg).  Patient noted to have PEG placed on 6/18 per [facility] notes.  Per review of RD notes at [facility], tube feeds of Jevity 1.5 started on 6/19. Upon follow up on 6/24, patient noted to be having  diarrhea so formula switched to Osmolite 1.5. Also switched to cyclic feeds at that time - Osmolite 1.5 at 88mL/hr x16 hours (1920 kcals, 80g protein, water). No notes after that time.  Per review of ED notes, patient has reportedly been NPO since discharge on 6/27. Unsure if he has received tube feeds since that time.   Patient currently NPO, remains at aspiration risk. Discussed patient with Dr. Robb Matar. Can start tube feeds today. Will start lower at 46mL/hr and advance slowly due to aspiration risk and as patient has not received nutrition since discharge.    Medications reviewed and include: -  Labs reviewed:  -   NUTRITION - FOCUSED PHYSICAL EXAM:  Unable to complete  Diet Order:   Diet Order             Diet NPO time specified  Diet effective now                   EDUCATION NEEDS:   Not appropriate for education at this time  Skin:    No RN assessment yet completed  Last BM:  unknown  Height:   Ht Readings from Last 1 Encounters:  10/25/22 5\' 11"  (1.803 m)   Weight:  Wt Readings from Last 1 Encounters:  11/06/2022 63.5 kg  *Will utilize 58.5kg (weight from [facility] admit - RD note 6/24) until new weight obtained this admission.   BMI:  Body mass index is 19.52  kg/m.  Estimated Nutritional Needs:  Kcal:  1950-2100 kcals Protein:  80-100 grams Fluid:  >/= 1.9L    Shelle Iron RD, LDN For contact information, refer to [facility].

## 2022-11-21 NOTE — Progress Notes (Signed)
Patient unable to do NIF and VC due to patient sleeping

## 2022-11-21 NOTE — Progress Notes (Signed)
Patient asking when will hospice see him, he states " I am miserable but they don't want me to go". Emotional support provided. Also given phone per patients request to call wife.

## 2022-11-21 NOTE — ED Provider Notes (Signed)
[place] EMERGENCY DEPARTMENT AT [facility] Provider Note   CSN: [id number] Arrival date & time: 11/09/2022  4098     History  Chief Complaint  Patient presents with   Shortness of Breath    Ronnie [name] is a 47 y.o. male.  Patient with a history of hypertension, muscular dystrophy, heart failure with reduced ejection fraction presenting with shortness of breath onset this evening.  Patient reports recent hospitalization at [facility].  He was hospitalized from June 12 until the 26th.  He was treated for aspiration pneumonia and completed antibiotics and was given a feeding tube.  States he has been n.p.o. since that hospitalization.  Breathing became worse tonight after getting situated in bed.  Wife believes this is due to not having a hospital bed at home.  Still feels short of breath and required oxygen for EMS.  He is tachypneic and tachycardic.  States he does have a cough that is nonproductive.  No fever.  No chest pain, abdominal pain, nausea or vomiting.  No diarrhea.  No constipation.  Patient was hospitalized at [facility] earlier in June and discharged to hospice.  He states he is no longer on hospice after getting a second opinion at [facility].  But he is DNR and DNI.  The history is provided by the patient, the EMS personnel and the spouse.  Shortness of Breath Associated symptoms: cough   Associated symptoms: no abdominal pain, no chest pain, no fever, no headaches, no rash and no vomiting        Home Medications Prior to Admission medications   Medication Sig Start Date End Date Taking? Authorizing Provider  fentaNYL (SUBLIMAZE) SOLN Inject 50-100 mcg into the vein every 30 (thirty) minutes as needed (sob). 10/31/22   Shelby Mattocks, DO  fentaNYL 10 mcg/ml SOLN infusion Inject 100-200 mcg/hr into the vein continuous. 10/31/22   Shelby Mattocks, DO  glycopyrrolate (ROBINUL) 0.2 MG/ML injection Inject 2 mLs (0.4 mg total) into the vein 4 (four) times daily. 10/31/22    Shelby Mattocks, DO  ipratropium-albuterol (DUONEB) 0.5-2.5 (3) MG/3ML SOLN Take 3 mLs by nebulization every 8 (eight) hours as needed. 10/31/22   Shelby Mattocks, DO  lidocaine (LIDODERM) 5 % Place 1 patch onto the skin daily. Remove & Discard patch within 12 hours or as directed by MD 10/31/22   Shelby Mattocks, DO  LORazepam (ATIVAN) 0.5 MG tablet Take 1 tablet (0.5 mg total) by mouth every 4 (four) hours as needed for anxiety. 10/31/22   Shelby Mattocks, DO  LORazepam (ATIVAN) 2 MG/ML injection Inject 0.25 mLs (0.5 mg total) into the vein every 4 (four) hours as needed for anxiety. 10/31/22   Shelby Mattocks, DO  metoprolol tartrate (LOPRESSOR) 5 MG/5ML SOLN injection Inject 2.5 mLs (2.5 mg total) into the vein every 12 (twelve) hours. 10/31/22   Shelby Mattocks, DO  polyethylene glycol (MIRALAX / GLYCOLAX) 17 g packet Take 17 g by mouth daily as needed for moderate constipation. 10/31/22   Shelby Mattocks, DO      Allergies    Lisinopril    Review of Systems   Review of Systems  Constitutional:  Negative for activity change, appetite change and fever.  HENT:  Negative for congestion and rhinorrhea.   Respiratory:  Positive for cough and shortness of breath. Negative for chest tightness.   Cardiovascular:  Negative for chest pain.  Gastrointestinal:  Negative for abdominal pain, nausea and vomiting.  Genitourinary:  Negative for dysuria and hematuria.  Musculoskeletal:  Negative  for arthralgias and myalgias.  Skin:  Negative for rash.  Neurological:  Positive for weakness. Negative for light-headedness and headaches.   all other systems are negative except as noted in the HPI and PMH.    Physical Exam Updated Vital Signs BP (!) 124/95   Pulse (!) 125   Temp 98.2 F (36.8 C)   Resp (!) 25   SpO2 94%  Physical Exam Vitals and nursing note reviewed.  Constitutional:      General: He is in acute distress.     Appearance: He is well-developed. He is ill-appearing.     Comments: Chronically  ill-appearing, dyspneic with conversation  HENT:     Head: Normocephalic and atraumatic.     Mouth/Throat:     Pharynx: No oropharyngeal exudate.  Eyes:     Conjunctiva/sclera: Conjunctivae normal.     Pupils: Pupils are equal, round, and reactive to light.  Neck:     Comments: No meningismus. Cardiovascular:     Rate and Rhythm: Regular rhythm. Tachycardia present.     Heart sounds: Normal heart sounds. No murmur heard. Pulmonary:     Effort: Pulmonary effort is normal. No respiratory distress.     Breath sounds: No wheezing.     Comments: Diminished breath sounds bilaterally Chest:     Chest wall: No tenderness.  Abdominal:     Palpations: Abdomen is soft.     Tenderness: There is no abdominal tenderness. There is no guarding or rebound.  Musculoskeletal:        General: No tenderness. Normal range of motion.     Cervical back: Normal range of motion and neck supple.  Skin:    General: Skin is warm.  Neurological:     Mental Status: He is alert and oriented to person, place, and time.     Cranial Nerves: No cranial nerve deficit.     Motor: No abnormal muscle tone.     Coordination: Coordination normal.     Comments: No ataxia on finger to nose bilaterally. No pronator drift. 5/5 strength throughout. CN 2-12 intact.Equal grip strength. Sensation intact.   Psychiatric:        Behavior: Behavior normal.     ED Results / Procedures / Treatments   Labs (all labs ordered are listed, but only abnormal results are displayed) Labs Reviewed  CBC WITH DIFFERENTIAL/PLATELET - Abnormal; Notable for the following components:      Result Value   WBC 11.5 (*)    RBC 4.09 (*)    Hemoglobin 11.8 (*)    HCT 38.5 (*)    RDW 15.8 (*)    All other components within normal limits  COMPREHENSIVE METABOLIC PANEL - Abnormal; Notable for the following components:   Glucose, Bld 113 (*)    Total Protein 9.4 (*)    All other components within normal limits  BRAIN NATRIURETIC PEPTIDE -  Abnormal; Notable for the following components:   B Natriuretic Peptide 127.3 (*)    All other components within normal limits  CULTURE, BLOOD (ROUTINE X 2)  CULTURE, BLOOD (ROUTINE X 2)  LACTIC ACID, PLASMA  LACTIC ACID, PLASMA  TROPONIN I (HIGH SENSITIVITY)  TROPONIN I (HIGH SENSITIVITY)    EKG EKG Interpretation Date/Time:  Friday November 21 2022 02:46:20 EDT Ventricular Rate:  124 PR Interval:  130 QRS Duration:  85 QT Interval:  338 QTC Calculation: 486 R Axis:   38  Text Interpretation: Sinus tachycardia Probable left atrial enlargement Abnormal R-wave progression, early transition Probable  LVH with secondary repol abnrm Borderline prolonged QT interval No significant change was found Confirmed by Glynn Octave [phone]) on 11/10/2022 2:59:00 AM  Radiology CT Angio Chest PE W and/or Wo Contrast  Result Date: 11/14/2022 CLINICAL DATA:  47 year old male with shortness of breath. Multilobar pneumonia earlier this month. EXAM: CT ANGIOGRAPHY CHEST WITH CONTRAST TECHNIQUE: Multidetector CT imaging of the chest was performed using the standard protocol during bolus administration of intravenous contrast. Multiplanar CT image reconstructions and MIPs were obtained to evaluate the vascular anatomy. RADIATION DOSE REDUCTION: This exam was performed according to the departmental dose-optimization program which includes automated exposure control, adjustment of the mA and/or kV according to patient size and/or use of iterative reconstruction technique. CONTRAST:  75mL OMNIPAQUE IOHEXOL 350 MG/ML SOLN COMPARISON:  Portable chest x-ray this morning. Chest CT without contrast 10/25/2022. FINDINGS: Cardiovascular: Excellent contrast bolus timing in the pulmonary arterial tree. No pulmonary artery filling defect. Heart size is stable. No pericardial effusion. Early contrast in the aorta. Mild Calcified aortic atherosclerosis. Mediastinum/Nodes: No discrete mediastinal mass or lymphadenopathy.  Lungs/Pleura: Layering secretions in the trachea, right mainstem bronchus, and the distal bronchus intermedius is opacified by retained or aspirated material (series 10, image 153). Complete right lower lobe consolidation is substantially progressed since the CT on 10/25/2022. Only trace superimposed right pleural effusion is present, and mostly tracking in the fissure. Superimposed upper lobe predominant emphysema, mostly paraseptal. Right middle lobe lateral segment peribronchial patchy and sub solid opacity is only mildly improved since 10/25/2022. Left lung major airways remain patent. Increased left lower lobe streaky peribronchial opacity, but mostly enhancing and more resembles atelectasis than pneumonia. No left pleural effusion. Upper Abdomen: Percutaneous gastrostomy tube now in place. Negative visible liver, gallbladder, spleen, pancreas, adrenal glands and kidneys. No dilated bowel, free air or free fluid in the visible upper abdomen. Musculoskeletal: No acute osseous abnormality identified. Review of the MIP images confirms the above findings. IMPRESSION: 1. Negative for pulmonary embolus. 2. Complete Right Lower Lobe Consolidation has progressed since the CT on 10/25/2022, and is again associated with aspirated or retained secretions in the airway. Recurrent Aspiration not excluded. 3. Superimposed right middle lobe Bronchopneumonia is only mildly improved since 10/25/2022. Minimal right pleural effusion. 4. Increased left lower lobe opacity but more resembles atelectasis than infection. 5.  Emphysema (ICD10-J43.9). Electronically Signed   By: Odessa Fleming M.D.   On: 11/02/2022 05:19   DG Chest Portable 1 View  Result Date: 11/06/2022 CLINICAL DATA:  47 year old male with shortness of breath. Multilobar right lung pneumonia earlier this month. EXAM: PORTABLE CHEST 1 VIEW COMPARISON:  Chest radiographs 10/29/2022, chest CT 10/25/2022 and earlier. FINDINGS: Portable AP semi upright view at 0356 hours.  Increased confluence of right lower lobe collapse or consolidation plus evidence of small layering right pleural effusion. But otherwise lung volumes and ventilation not significantly changed from 10/29/2022. Upper lungs remain relatively clear. Stable cardiac size and mediastinal contours. Visualized tracheal air column is within normal limits. No pneumothorax. No pulmonary edema. No acute osseous abnormality identified. Percutaneous gastrostomy tube now visible in the left upper quadrant. IMPRESSION: Increased confluence of right lower lobe collapse or consolidation and small right pleural effusion since 10/29/2022. Electronically Signed   By: Odessa Fleming M.D.   On: 11/03/2022 04:46    Procedures .Critical Care  Performed by: Glynn Octave, MD Authorized by: Glynn Octave, MD   Critical care provider statement:    Critical care time (minutes):  35   Critical care time was  exclusive of:  Separately billable procedures and treating other patients   Critical care was necessary to treat or prevent imminent or life-threatening deterioration of the following conditions:  Respiratory failure and sepsis   Critical care was time spent personally by me on the following activities:  Development of treatment plan with patient or surrogate, discussions with consultants, evaluation of patient's response to treatment, examination of patient, ordering and review of laboratory studies, ordering and review of radiographic studies, ordering and performing treatments and interventions, pulse oximetry, re-evaluation of patient's condition, review of old charts, blood draw for specimens and obtaining history from patient or surrogate   I assumed direction of critical care for this patient from another provider in my specialty: no     Care discussed with: admitting provider       Medications Ordered in ED Medications  ipratropium-albuterol (DUONEB) 0.5-2.5 (3) MG/3ML nebulizer solution 3 mL (has no administration in  time range)  lactated ringers bolus 500 mL (has no administration in time range)    ED Course/ Medical Decision Making/ A&P                             Medical Decision Making Amount and/or Complexity of Data Reviewed Labs: ordered. Decision-making details documented in ED Course. Radiology: ordered and independent interpretation performed. Decision-making details documented in ED Course. ECG/medicine tests: ordered and independent interpretation performed. Decision-making details documented in ED Course.  Risk Prescription drug management. Decision regarding hospitalization.   Patient with muscular dystrophy here with shortness of breath, tachypnea and tachycardia.  Patient arrives tachycardic, tachypneic, hypoxic.  Concern for sepsis likely secondary to recurrent aspiration pneumonia.  Will send blood cultures and lactic acid.  Initiate broad-spectrum antibiotics after cultures are obtained.  X-ray is concerning for right-sided consolidation with right lower lobe collapse and pleural effusion.  Results reviewed interpreted by me.  This is consistent with likely recurrent aspiration.  CTA is negative for pulmonary embolism.  Does show worsening right-sided pneumonia and likely recurrent aspiration.  Continue broad-spectrum antibiotics involving vancomycin, cefepime and Flagyl.  Patient remains tachycardic.  Judicious IV fluids given given his low ejection fraction.  Lactate is normal.  Does not qualify for 30/kg bolus.  Plan admission for recurrent aspiration pneumonia with evolving sepsis.  Remains tachycardic and tachypneic but saturating well on 4 L nasal cannula oxygen.  Patient confirms DNR/DNI.  Admission discussed with Dr. Arlean Hopping.        Final Clinical Impression(s) / ED Diagnoses Final diagnoses:  None    Rx / DC Orders ED Discharge Orders     None         Purnell Daigle, Jeannett Senior, MD 10/29/2022 [phone]

## 2022-11-22 DIAGNOSIS — R531 Weakness: Secondary | ICD-10-CM

## 2022-11-22 DIAGNOSIS — Z515 Encounter for palliative care: Secondary | ICD-10-CM | POA: Diagnosis not present

## 2022-11-22 DIAGNOSIS — Z7189 Other specified counseling: Secondary | ICD-10-CM

## 2022-11-22 DIAGNOSIS — J189 Pneumonia, unspecified organism: Secondary | ICD-10-CM | POA: Diagnosis not present

## 2022-11-22 LAB — MAGNESIUM
Magnesium: 2.2 mg/dL (ref 1.7–2.4)
Magnesium: 2 mg/dL (ref 1.7–2.4)

## 2022-11-22 LAB — LACTIC ACID, PLASMA: Lactic Acid, Venous: 1.3 mmol/L (ref 0.5–1.9)

## 2022-11-22 LAB — GLUCOSE, CAPILLARY
Glucose-Capillary: 103 mg/dL — ABNORMAL HIGH (ref 70–99)
Glucose-Capillary: 108 mg/dL — ABNORMAL HIGH (ref 70–99)
Glucose-Capillary: 116 mg/dL — ABNORMAL HIGH (ref 70–99)
Glucose-Capillary: 120 mg/dL — ABNORMAL HIGH (ref 70–99)
Glucose-Capillary: 134 mg/dL — ABNORMAL HIGH (ref 70–99)
Glucose-Capillary: 137 mg/dL — ABNORMAL HIGH (ref 70–99)

## 2022-11-22 LAB — PHOSPHORUS
Phosphorus: 4.3 mg/dL (ref 2.5–4.6)
Phosphorus: 4.5 mg/dL (ref 2.5–4.6)

## 2022-11-22 LAB — CULTURE, BLOOD (ROUTINE X 2): Culture: NO GROWTH

## 2022-11-22 LAB — MRSA NEXT GEN BY PCR, NASAL: MRSA by PCR Next Gen: NOT DETECTED

## 2022-11-22 MED ORDER — DIPHENHYDRAMINE HCL 12.5 MG/5ML PO ELIX
12.5000 mg | ORAL_SOLUTION | Freq: Four times a day (QID) | ORAL | Status: DC | PRN
  Administered 2022-11-25: 12.5 mg via ORAL
  Filled 2022-11-22: qty 5

## 2022-11-22 MED ORDER — HYDROMORPHONE HCL 2 MG PO TABS
8.0000 mg | ORAL_TABLET | ORAL | Status: DC | PRN
  Administered 2022-11-22: 8 mg
  Filled 2022-11-22: qty 4

## 2022-11-22 MED ORDER — NALOXONE HCL 0.4 MG/ML IJ SOLN: 0.4000 mg | INTRAMUSCULAR | Status: DC | PRN

## 2022-11-22 MED ORDER — IPRATROPIUM-ALBUTEROL 0.5-2.5 (3) MG/3ML IN SOLN
3.0000 mL | Freq: Three times a day (TID) | RESPIRATORY_TRACT | Status: DC
  Administered 2022-11-22 – 2022-11-23 (×2): 3 mL via RESPIRATORY_TRACT
  Filled 2022-11-22 (×3): qty 3

## 2022-11-22 MED ORDER — DIPHENHYDRAMINE HCL 50 MG/ML IJ SOLN: 12.5000 mg | Freq: Four times a day (QID) | INTRAMUSCULAR | Status: DC | PRN

## 2022-11-22 MED ORDER — FENTANYL 50 MCG/ML IV PCA SOLN
INTRAVENOUS | Status: AC
  Administered 2022-11-23: 175 ug via INTRAVENOUS
  Administered 2022-11-23: 100 ug via INTRAVENOUS
  Administered 2022-11-23: 112.5 ug via INTRAVENOUS
  Administered 2022-11-24: 237.5 ug via INTRAVENOUS
  Administered 2022-11-24: 75 ug via INTRAVENOUS
  Administered 2022-11-24 (×2): 137.5 ug via INTRAVENOUS
  Administered 2022-11-24: 50 ug via INTRAVENOUS
  Administered 2022-11-25: 75 ug via INTRAVENOUS
  Administered 2022-11-25: 37.5 ug via INTRAVENOUS
  Administered 2022-11-25: 87.5 ug via INTRAVENOUS
  Filled 2022-11-22 (×2): qty 25

## 2022-11-22 MED ORDER — SODIUM CHLORIDE 0.9% FLUSH: 9.0000 mL | INTRAVENOUS | Status: DC | PRN

## 2022-11-22 NOTE — Progress Notes (Signed)
NIF -18 with best effort.

## 2022-11-22 NOTE — Progress Notes (Signed)
RT note: Pt. was able to obtain NIF of (-15)cmh20, remains alert/orientated on etC02 device.

## 2022-11-22 NOTE — Consult Note (Signed)
Consultation Note Date: 11/22/2022   Patient Name: Ronnie [name]  DOB: 10-Aug-1975  MRN: [id number]  Age / Sex: 47 y.o., male  PCP: Claiborne Rigg, NP Referring Physician: Glade Lloyd, MD  Reason for Consultation: Establishing goals of care And symptom management.   HPI/Patient Profile: 47 y.o. male admitted on 11/03/2022    Clinical Assessment and Goals of Care: 47 year old gentleman who is known to the palliative medicine service.  In a previous hospitalization, he was seen extensively by palliative medicine staff.  Ultimately, goals of care were discussed and determined to be comfort-focused.  Fentanyl PCA was initiated and the patient was transferred to local residential hospice. It is reported that the patient's wife and son asked for the patient to be discharged from local hospice facility.  He was then taken to [facility] for second opinion. Patient has life limiting illness of muscular dystrophy and as such, has chronic respiratory failure with hypercapnia and recurrent episodes of aspiration.  He also has underlying chronic systolic heart failure dyslipidemia normocytic anemia tobacco dependence in remission, cocaine use in remission, alcohol use in remission.  Also has a history of sleep apnea. Patient was seen and evaluated reportedly by neurology as well as palliative care at [facility].  He ultimately underwent PEG tube placement and was discharged home and there was to be an outpatient palliative consult. Patient has been brought in with worsening shortness of breath. Patient is since admitted to hospital medicine service with most recent chest CT imaging showing complete right lower lobe consolidation that has progressed since the CT scan earlier this month, superimposed right middle lobe bronchopneumonia, increased left lower lobe opacity and changes consistent with emphysema.  He has been  started on broad-spectrum antibiotics and IV fluids. Patient remains admitted for possible bilateral aspiration pneumonia/healthcare respiratory pneumonia in the setting of chronic respiratory failure from progressive muscular dystrophy. Since being admitted to the hospital, the patient voiced to bedside nursing staff that he wished to speak with hospice staff again and that he wanted to go back to [facility].  Palliative consult request received.  Patient seen and examined.  Prolonged discussions with various members of the interdisciplinary team via secure chat with regards to the care of this patient-hospital medicine, TOC, bedside nursing as well as hospice liaison.  Palliative medicine is specialized medical care for people living with serious illness. It focuses on providing relief from the symptoms and stress of a serious illness. The goal is to improve quality of life for both the patient and the family.   Goals of care: Broad aims of medical therapy in relation to the patient's values and preferences. Our aim is to provide medical care aimed at enabling patients to achieve the goals that matter most to them, given the circumstances of their particular medical situation and their constraints.    HCPOA  Wife and son.  Patient lives at home with his wife and son.  His wife's name is Marval Regal.  Patient's son is in his early  20s.  Patient states that his son was shot when he was 107 and that he has had some health challenges after that and he now cares for his son.  SUMMARY OF RECOMMENDATIONS   Goals of care discussions undertaken with the patient.  He has a flat affect however he is awake alert answers all questions appropriately, appears to have decision-making capacity and is able to provide all aspects of his history.  Patient states that he has existential suffering, he states that his symptom burden is simply too high to continue living like this.  He states that  that he is well aware of the serious irreversible nature of his illness.  He knows that that he is at risk for ongoing recurrent aspiration. Patient given the time and space and opportunity for reflection on his health condition and his wishes overall.  Patient states that the most comfortable he felt was when the fentanyl PCA was initiated here at the hospital and when he was transferred to residential hospice.  He stated that he was requesting transfer to local hospice so that he may be able to get the fentanyl PCA and have his symptoms controlled.  Patient complains of generalized uncontrolled pain.  Additionally, he states that his wife and son are " taking this rough".  States that they are very concerned about his ongoing decline and decompensation and are not in favor of a hospice approach.  He states that he will speak with them this weekend about his wishes. Plan: Patient to remain in the hospital for this weekend Start low-dose bolus only fentanyl PCA for symptom management Continue to explore whether home with hospice with CADD pump fentanyl PCA would be a reasonable option or not. Patient has severe extensive disease in his lungs with regards to ongoing aspiration.  For now, he wishes to continue tube feeds. Palliative will continue to follow for ongoing goals of care discussions.  Code Status/Advance Care Planning: DNR   Symptom Management:     Palliative Prophylaxis:  Aspiration  Psycho-social/Spiritual:  Desire for further Chaplaincy support:yes Additional Recommendations: Caregiving  Support/Resources  Prognosis:  Unable to determine  Discharge Planning: To Be Determined      Primary Diagnoses: Present on Admission:  HCAP (healthcare-associated pneumonia)  Chronic systolic heart failure (HCC)  Essential hypertension  Muscular dystrophy (HCC)  Mixed hyperlipidemia  Tobacco dependence due to cigarettes  Normocytic anemia  Chronic respiratory failure with  hypercapnia (HCC)  Aspiration into lower respiratory tract  Prolonged QT interval   I have reviewed the medical record, interviewed the patient and family, and examined the patient. The following aspects are pertinent.  Past Medical History:  Diagnosis Date   Hypertension    Muscular dystrophy (HCC)    Substance abuse (HCC)    Social History   Socioeconomic History   Marital status: Significant Other    Spouse name: Not on file   Number of children: 1   Years of education: 47   Highest education level: Not on file  Occupational History   Occupation: Skilled Laborer  Tobacco Use   Smoking status: Every Day    Packs/day: 0.50    Years: 25.00    Additional pack years: 0.00    Total pack years: 12.50    Types: Cigarettes   Smokeless tobacco: Never  Vaping Use   Vaping Use: Never used  Substance and Sexual Activity   Alcohol use: Yes    Alcohol/week: 6.0 standard drinks of alcohol    Types: 6  Cans of beer per week    Comment: occ   Drug use: Not Currently    Types: Cocaine   Sexual activity: Yes    Partners: Female    Birth control/protection: None  Other Topics Concern   Not on file  Social History Narrative   Fun/Hobby: Fishing    Patient lives with girlfriend. Alcohol drinker and cocaine user.    Social Determinants of Health   Financial Resource Strain: Not on file  Food Insecurity: No Food Insecurity (11/22/2022)   Hunger Vital Sign    Worried About Running Out of Food in the Last Year: Never true    Ran Out of Food in the Last Year: Never true  Transportation Needs: No Transportation Needs (11/22/2022)   PRAPARE - Administrator, Civil Service (Medical): No    Lack of Transportation (Non-Medical): No  Physical Activity: Not on file  Stress: Not on file  Social Connections: Not on file   Family History  Problem Relation Age of Onset   Heart disease Mother    Heart disease Maternal Grandmother    Scheduled Meds:  fentaNYL   Intravenous Q4H    free water  100 mL Per Tube Q4H    HYDROmorphone (DILAUDID) injection  1 mg Intravenous Once   ipratropium-albuterol  3 mL Nebulization TID   Continuous Infusions:  ceFEPime (MAXIPIME) IV 2 g (11/22/22 1240)   feeding supplement (OSMOLITE 1.5 CAL) 40 mL/hr at 11/22/22 1124   metronidazole Stopped (11/22/22 9147)   vancomycin Stopped (11/22/22 0756)   PRN Meds:.acetaminophen **OR** acetaminophen, diphenhydrAMINE **OR** diphenhydrAMINE, fentaNYL (SUBLIMAZE) injection, HYDROmorphone, ipratropium-albuterol, naloxone **AND** sodium chloride flush, prochlorperazine Medications Prior to Admission:  Prior to Admission medications   Medication Sig Start Date End Date Taking? Authorizing Provider  HYDROmorphone (DILAUDID) 8 MG tablet Take 8 mg by mouth every 4 (four) hours as needed for moderate pain. 11/19/22  Yes [provider]  fentaNYL (SUBLIMAZE) SOLN Inject 50-100 mcg into the vein every 30 (thirty) minutes as needed (sob). Patient not taking: Reported on 11/02/2022 10/31/22   Shelby Mattocks, DO  fentaNYL 10 mcg/ml SOLN infusion Inject 100-200 mcg/hr into the vein continuous. Patient not taking: Reported on 11/06/2022 10/31/22   Shelby Mattocks, DO  glycopyrrolate (ROBINUL) 0.2 MG/ML injection Inject 2 mLs (0.4 mg total) into the vein 4 (four) times daily. Patient not taking: Reported on 10/25/2022 10/31/22   Shelby Mattocks, DO  ipratropium-albuterol (DUONEB) 0.5-2.5 (3) MG/3ML SOLN Take 3 mLs by nebulization every 8 (eight) hours as needed. 10/31/22   Shelby Mattocks, DO  lidocaine (LIDODERM) 5 % Place 1 patch onto the skin daily. Remove & Discard patch within 12 hours or as directed by MD Patient not taking: Reported on 10/31/2022 10/31/22   Shelby Mattocks, DO  LORazepam (ATIVAN) 0.5 MG tablet Take 1 tablet (0.5 mg total) by mouth every 4 (four) hours as needed for anxiety. Patient not taking: Reported on 11/19/2022 10/31/22   Shelby Mattocks, DO  LORazepam (ATIVAN) 2 MG/ML injection Inject 0.25 mLs  (0.5 mg total) into the vein every 4 (four) hours as needed for anxiety. Patient not taking: Reported on 11/09/2022 10/31/22   Shelby Mattocks, DO  metoprolol tartrate (LOPRESSOR) 5 MG/5ML SOLN injection Inject 2.5 mLs (2.5 mg total) into the vein every 12 (twelve) hours. Patient not taking: Reported on 11/13/2022 10/31/22   Shelby Mattocks, DO  polyethylene glycol (MIRALAX / GLYCOLAX) 17 g packet Take 17 g by mouth daily as needed for moderate constipation. Patient  not taking: Reported on 11/07/2022 10/31/22   Shelby Mattocks, DO   Allergies  Allergen Reactions   Lisinopril Cough   Review of Systems +weakness +generalized pain  Physical Exam Weak appearing gentleman Appears chronically ill and deconditioned Appears with cachexia Diminished breath sounds S1-S2 Abdomen not distended has PEG tube is on tube feeds Responds appropriately to questions asked Flat affect  Vital Signs: BP (!) 113/90 (BP Location: Right Arm)   Pulse (!) 116   Temp 97.9 F (36.6 C) (Oral)   Resp (!) 22   Ht 5\' 11"  (1.803 m)   Wt 58.7 kg   SpO2 98%   BMI 18.05 kg/m  Pain Scale: 0-10   Pain Score: Asleep   SpO2: SpO2: 98 % O2 Device:SpO2: 98 % O2 Flow Rate: .   IO: Intake/output summary:  Intake/Output Summary (Last 24 hours) at 11/22/2022 1444 Last data filed at 11/22/2022 1205 Gross per 24 hour  Intake 2530.34 ml  Output 1200 ml  Net 1330.34 ml    LBM: Last BM Date : 11/22/22 Baseline Weight: Weight: 63.5 kg Most recent weight: Weight: 58.7 kg     Palliative Assessment/Data:   PPS 50%  Time In:  1440 Time Out:  1540 Time Total:  60  Greater than 50%  of this time was spent counseling and coordinating care related to the above assessment and plan.  Signed by: Rosalin Hawking, MD   Please contact Palliative Medicine Team phone at [phone] for questions and concerns.  For individual provider: See Loretha Stapler

## 2022-11-22 NOTE — Progress Notes (Signed)
PROGRESS NOTE    Ronnie [name]  ZOX:[id number] DOB: 1976-01-10 DOA: 11/13/2022 PCP: Claiborne Rigg, NP   Brief Narrative:  47 y.o. male with medical history significant of hypertension, muscular dystrophy, chronic respiratory failure with hypercapnia, chronic systolic heart failure, hyperlipidemia, normocytic anemia, tobacco dependence in remission, cocaine abuse in remission, alcohol abuse in remission, sleep apnea with recent admission at [facility] health and discharged on 10/31/2022 to residential hospice.  Unclear what happened but patient apparently ended up at [facility], had a PEG tube placed on 11/11/2022 and was discharged 2 days prior to presentation presented with worsening shortness of breath.  On presentation, white count was 11.5, lactic acid and troponins were normal. Portable 1 view chest radiograph show increased confluence of the right lower lobe collapse or consolidation and small right pleural effusion since 10/29/2022. CTA chest was negative for PE; complete right lower lobe consolidation, progressed since CT earlier this month; superimposed right middle lobe bronchopneumonia; minimal right pleural effusion; increased left lower lobe opacity; emphysema.  He was started on broad-spectrum antibiotics and IV fluids.  Assessment & Plan:   Possible bilateral aspiration pneumonia/healthcare respiratory pneumonia Chronic respiratory failure with hypercapnia and hypoxia Progressive muscular dystrophy Goals of care -Imaging as above: Showing worsening consolidation.  Currently on broad-spectrum antibiotics.  Patient will probably continue to aspirate.  Keep him strictly n.p.o. -Palliative care consultation for goals of care discussion.  He was discharged on 10/31/2022 from [facility] health to residential hospice.  Unclear what happened but patient apparently ended up at [facility], had a PEG tube placed on 11/11/2022 and was discharged 2 days prior to presentation presented with worsening shortness of breath.   Patient is currently asking to speak to palliative care/hospice team. -Overall prognosis is very poor.  Status post PEG tube placement -He is still aspirating.  Unclear if we should continue PEG feeding.  Will follow palliative care/hospice team recommendations.  Chronic systolic heart failure -Not volume overloaded.  Strict input output.  Daily weights.  Was given gentle hydration on presentation.  Leukocytosis -mild.  Monitor intermittently  Anemia of chronic disease -From chronic illnesses.  Hemoglobin stable.  Monitor intermittently  Mixed hyperlipidemia -Not on any medical therapy.  Outpatient follow-up with PCP  Chronic tobacco use -Apparently stopped smoking a month ago  DVT prophylaxis: SCDs Code Status: DNR.  Patient apparently presented hospice status recently Family Communication: None at bedside Disposition Plan: Status is: Inpatient Remains inpatient appropriate because: Of severity of illness.    Consultants: Palliative care/hospice team  Procedures: None  Antimicrobials:  Anti-infectives (From admission, onward)    Start     Dose/Rate Route Frequency Ordered Stop   10/27/2022 1800  vancomycin (VANCOCIN) IVPB 1000 mg/200 mL premix        1,000 mg 200 mL/hr over 60 Minutes Intravenous Every 12 hours 11/05/2022 0513     10/29/2022 1800  metroNIDAZOLE (FLAGYL) IVPB 500 mg        500 mg 100 mL/hr over 60 Minutes Intravenous Every 12 hours 10/28/2022 0846     11/08/2022 1300  ceFEPIme (MAXIPIME) 2 g in sodium chloride 0.9 % 100 mL IVPB       Note to Pharmacy: Please adjust dose as needed. TY.   2 g 200 mL/hr over 30 Minutes Intravenous Every 8 hours 11/13/2022 0846     11/08/2022 0515  vancomycin (VANCOCIN) IVPB 1000 mg/200 mL premix        1,000 mg 200 mL/hr over 60 Minutes Intravenous  Once 11/06/2022 0509 11/01/2022 0751  11/08/2022 0500  ceFEPIme (MAXIPIME) 2 g in sodium chloride 0.9 % 100 mL IVPB        2 g 200 mL/hr over 30 Minutes Intravenous  Once 11/11/2022 0450  11/14/2022 0609   11/01/2022 0500  metroNIDAZOLE (FLAGYL) IVPB 500 mg        500 mg 100 mL/hr over 60 Minutes Intravenous  Once 11/16/2022 0450 11/17/2022 0751        Subjective: Patient seen and examined at bedside.  Poor historian.  Wants to go to [facility] now.  No agitation, seizures or fever reported.  Objective: Vitals:   11/22/22 0409 11/22/22 0412 11/22/22 0627 11/22/22 0746  BP:  (!) 109/92 (!) 115/92   Pulse:  (!) 120 (!) 116   Resp:  18 17   Temp:  98.3 F (36.8 C) 97.9 F (36.6 C)   TempSrc:  Oral Oral   SpO2:  100% 98% 99%  Weight: 58.7 kg       Intake/Output Summary (Last 24 hours) at 11/22/2022 0820 Last data filed at 11/22/2022 0600 Gross per 24 hour  Intake 1987.1 ml  Output 850 ml  Net 1137.1 ml   Filed Weights   11/20/2022 0500 11/22/22 0409  Weight: 63.5 kg 58.7 kg    Examination:  General exam: Appears calm and comfortable.  Looks chronically ill and deconditioned.  Currently on room air.  Extremely thinly built. Respiratory system: Bilateral decreased breath sounds at bases with scattered crackles and intermittent tachypnea Cardiovascular system: S1 & S2 heard, tachycardic intermittently  gastrointestinal system: Abdomen is nondistended, soft and nontender. Normal bowel sounds heard.  PEG tube present. Extremities: No cyanosis, clubbing, edema  Central nervous system: Alert and oriented.  Slow to respond.   No focal neurological deficits. Moving extremities Skin: No rashes, lesions or ulcers Psychiatry: Flat affect.  Not agitated.   Data Reviewed: I have personally reviewed following labs and imaging studies  CBC: Recent Labs  Lab 11/20/2022 0312  WBC 11.5*  NEUTROABS 7.2  HGB 11.8*  HCT 38.5*  MCV 94.1  PLT 237   Basic Metabolic Panel: Recent Labs  Lab 10/25/2022 0312 11/17/2022 0524 11/06/2022 1645 11/22/22 0512  NA 140  --   --   --   K 3.9  --   --   --   CL 103  --   --   --   CO2 27  --   --   --   GLUCOSE 113*  --   --   --   BUN  16  --   --   --   CREATININE 0.68  --   --   --   CALCIUM 9.9  --   --   --   MG  --  1.8 2.2 2.2  PHOS  --  3.5 4.1 4.3   GFR: Estimated Creatinine Clearance: 93.8 mL/min (by C-G formula based on SCr of 0.68 mg/dL). Liver Function Tests: Recent Labs  Lab 11/09/2022 0312  AST 24  ALT 21  ALKPHOS 76  BILITOT 0.5  PROT 9.4*  ALBUMIN 3.6   No results for input(s): "LIPASE", "AMYLASE" in the last 168 hours. No results for input(s): "AMMONIA" in the last 168 hours. Coagulation Profile: No results for input(s): "INR", "PROTIME" in the last 168 hours. Cardiac Enzymes: No results for input(s): "CKTOTAL", "CKMB", "CKMBINDEX", "TROPONINI" in the last 168 hours. BNP (last 3 results) No results for input(s): "PROBNP" in the last 8760 hours. HbA1C: No results for input(s): "HGBA1C"  in the last 72 hours. CBG: Recent Labs  Lab 10/26/2022 1822 11/17/2022 1958 10/31/2022 2351 11/22/22 0419 11/22/22 0802  GLUCAP 99 132* 143* 103* 134*   Lipid Profile: No results for input(s): "CHOL", "HDL", "LDLCALC", "TRIG", "CHOLHDL", "LDLDIRECT" in the last 72 hours. Thyroid Function Tests: No results for input(s): "TSH", "T4TOTAL", "FREET4", "T3FREE", "THYROIDAB" in the last 72 hours. Anemia Panel: No results for input(s): "VITAMINB12", "FOLATE", "FERRITIN", "TIBC", "IRON", "RETICCTPCT" in the last 72 hours. Sepsis Labs: Recent Labs  Lab 10/28/2022 0312 11/20/2022 0524 11/18/2022 2308  LATICACIDVEN 1.1 0.9 1.3    Recent Results (from the past 240 hour(s))  Blood culture (routine x 2)     Status: None (Preliminary result)   Collection Time: 10/29/2022  3:12 AM   Specimen: BLOOD  Result Value Ref Range Status   Specimen Description   Final    BLOOD RIGHT ANTECUBITAL Performed at [facility], [address], [place], [place] [address]    Special Requests   Final    BOTTLES DRAWN AEROBIC AND ANAEROBIC Blood Culture adequate volume Performed at [facility], [address], [place], [place] [address]    Culture   Final    NO GROWTH 1 DAY Performed at [facility], [address], [place], [place] [address]    Report Status PENDING  Incomplete  Blood culture (routine x 2)     Status: None (Preliminary result)   Collection Time: 11/04/2022  3:40 AM   Specimen: BLOOD  Result Value Ref Range Status   Specimen Description   Final    BLOOD BLOOD LEFT FOREARM Performed at [facility], [address] [place], [place] [address]    Special Requests   Final    BOTTLES DRAWN AEROBIC AND ANAEROBIC Blood Culture adequate volume Performed at [facility], [address] [place], [place] [address]    Culture   Final    NO GROWTH 1 DAY Performed at [facility], [address], [place], [place] [address]    Report Status PENDING  Incomplete         Radiology Studies: CT Angio Chest PE W and/or Wo Contrast  Result Date: 11/20/2022 CLINICAL DATA:  47 year old male with shortness of breath. Multilobar pneumonia earlier this month. EXAM: CT ANGIOGRAPHY CHEST WITH CONTRAST TECHNIQUE: Multidetector CT imaging of the chest was performed using the standard protocol during bolus administration of intravenous contrast. Multiplanar CT image reconstructions and MIPs were obtained to evaluate the vascular anatomy. RADIATION DOSE REDUCTION: This exam was performed according to the departmental dose-optimization program which includes automated exposure control, adjustment of the mA and/or kV according to patient size and/or use of iterative reconstruction technique. CONTRAST:  75mL OMNIPAQUE IOHEXOL 350 MG/ML SOLN COMPARISON:  Portable chest x-ray this morning. Chest CT without contrast 10/25/2022. FINDINGS: Cardiovascular: Excellent contrast bolus timing in the pulmonary arterial tree. No pulmonary artery filling defect. Heart size is stable. No pericardial effusion. Early contrast in the aorta. Mild Calcified aortic  atherosclerosis. Mediastinum/Nodes: No discrete mediastinal mass or lymphadenopathy. Lungs/Pleura: Layering secretions in the trachea, right mainstem bronchus, and the distal bronchus intermedius is opacified by retained or aspirated material (series 10, image 153). Complete right lower lobe consolidation is substantially progressed since the CT on 10/25/2022. Only trace superimposed right pleural effusion is present, and mostly tracking in the fissure. Superimposed upper lobe predominant emphysema, mostly paraseptal. Right middle lobe lateral segment peribronchial patchy and sub solid opacity is  only mildly improved since 10/25/2022. Left lung major airways remain patent. Increased left lower lobe streaky peribronchial opacity, but mostly enhancing and more resembles atelectasis than pneumonia. No left pleural effusion. Upper Abdomen: Percutaneous gastrostomy tube now in place. Negative visible liver, gallbladder, spleen, pancreas, adrenal glands and kidneys. No dilated bowel, free air or free fluid in the visible upper abdomen. Musculoskeletal: No acute osseous abnormality identified. Review of the MIP images confirms the above findings. IMPRESSION: 1. Negative for pulmonary embolus. 2. Complete Right Lower Lobe Consolidation has progressed since the CT on 10/25/2022, and is again associated with aspirated or retained secretions in the airway. Recurrent Aspiration not excluded. 3. Superimposed right middle lobe Bronchopneumonia is only mildly improved since 10/25/2022. Minimal right pleural effusion. 4. Increased left lower lobe opacity but more resembles atelectasis than infection. 5.  Emphysema (ICD10-J43.9). Electronically Signed   By: Odessa Fleming M.D.   On: 11/01/2022 05:19   DG Chest Portable 1 View  Result Date: 11/20/2022 CLINICAL DATA:  47 year old male with shortness of breath. Multilobar right lung pneumonia earlier this month. EXAM: PORTABLE CHEST 1 VIEW COMPARISON:  Chest radiographs 10/29/2022, chest  CT 10/25/2022 and earlier. FINDINGS: Portable AP semi upright view at 0356 hours. Increased confluence of right lower lobe collapse or consolidation plus evidence of small layering right pleural effusion. But otherwise lung volumes and ventilation not significantly changed from 10/29/2022. Upper lungs remain relatively clear. Stable cardiac size and mediastinal contours. Visualized tracheal air column is within normal limits. No pneumothorax. No pulmonary edema. No acute osseous abnormality identified. Percutaneous gastrostomy tube now visible in the left upper quadrant. IMPRESSION: Increased confluence of right lower lobe collapse or consolidation and small right pleural effusion since 10/29/2022. Electronically Signed   By: Odessa Fleming M.D.   On: 11/17/2022 04:46        Scheduled Meds:  free water  100 mL Per Tube Q4H    HYDROmorphone (DILAUDID) injection  1 mg Intravenous Once   ipratropium-albuterol  3 mL Nebulization TID   Continuous Infusions:  ceFEPime (MAXIPIME) IV 2 g (11/22/22 0416)   feeding supplement (OSMOLITE 1.5 CAL) 30 mL/hr at 11/22/22 0246   metronidazole 500 mg (11/22/22 0519)   vancomycin 1,000 mg (11/22/22 0656)          Glade Lloyd, MD [facility] 11/22/2022, 8:20 AM

## 2022-11-22 NOTE — Progress Notes (Signed)
[employer], [employer] [facility]) Hospital Liaison Note  Spoke with patient about [facility] per his request. He states that he wants to go to [facility]. He states that he is suffering and does not want to live like this.   I called to inform wife, she states that she is planning to go see the patient and she will call me back to advise on what to do next.   At this time, I have not spoken with patient's wife. [facility] liaison will follow up with her tomorrow as we do not want her to feel pressured.   Please call with any questions or concerns. Thank you  Ronnie [name], [facility] Liaison [phone]

## 2022-11-23 DIAGNOSIS — J189 Pneumonia, unspecified organism: Secondary | ICD-10-CM | POA: Diagnosis not present

## 2022-11-23 DIAGNOSIS — Z931 Gastrostomy status: Secondary | ICD-10-CM | POA: Diagnosis not present

## 2022-11-23 DIAGNOSIS — J9612 Chronic respiratory failure with hypercapnia: Secondary | ICD-10-CM | POA: Diagnosis not present

## 2022-11-23 DIAGNOSIS — G7100 Muscular dystrophy, unspecified: Secondary | ICD-10-CM

## 2022-11-23 LAB — MAGNESIUM: Magnesium: 2.3 mg/dL (ref 1.7–2.4)

## 2022-11-23 LAB — GLUCOSE, CAPILLARY
Glucose-Capillary: 132 mg/dL — ABNORMAL HIGH (ref 70–99)
Glucose-Capillary: 134 mg/dL — ABNORMAL HIGH (ref 70–99)
Glucose-Capillary: 111 mg/dL — ABNORMAL HIGH (ref 70–99)
Glucose-Capillary: 123 mg/dL — ABNORMAL HIGH (ref 70–99)
Glucose-Capillary: 141 mg/dL — ABNORMAL HIGH (ref 70–99)

## 2022-11-23 LAB — PHOSPHORUS: Phosphorus: 4.4 mg/dL (ref 2.5–4.6)

## 2022-11-23 MED ORDER — LORAZEPAM 2 MG/ML PO CONC
0.5000 mg | ORAL | Status: DC | PRN
  Administered 2022-11-26: 0.5 mg
  Filled 2022-11-23: qty 1
  Filled 2022-11-23: qty 0.25

## 2022-11-23 MED ORDER — SODIUM CHLORIDE 3 % IN NEBU
4.0000 mL | INHALATION_SOLUTION | Freq: Two times a day (BID) | RESPIRATORY_TRACT | Status: AC
  Administered 2022-11-23 – 2022-11-26 (×6): 4 mL via RESPIRATORY_TRACT
  Filled 2022-11-23 (×6): qty 4

## 2022-11-23 MED ORDER — PIPERACILLIN-TAZOBACTAM 3.375 G IVPB
3.3750 g | Freq: Three times a day (TID) | INTRAVENOUS | Status: DC
  Administered 2022-11-23 – 2022-11-26 (×9): 3.375 g via INTRAVENOUS
  Filled 2022-11-23 (×10): qty 50

## 2022-11-23 MED ORDER — IPRATROPIUM-ALBUTEROL 0.5-2.5 (3) MG/3ML IN SOLN
3.0000 mL | Freq: Two times a day (BID) | RESPIRATORY_TRACT | Status: DC
  Administered 2022-11-23 – 2022-11-27 (×8): 3 mL via RESPIRATORY_TRACT
  Filled 2022-11-23 (×7): qty 3

## 2022-11-23 MED ORDER — HYDROMORPHONE HCL 1 MG/ML IJ SOLN
1.0000 mg | INTRAMUSCULAR | Status: DC | PRN
  Administered 2022-11-23 – 2022-11-25 (×8): 2 mg via INTRAVENOUS
  Filled 2022-11-23 (×8): qty 2

## 2022-11-23 NOTE — Consult Note (Signed)
NAME:  Ronnie [name], MRN:  [id number], DOB:  1975/08/16, LOS: 2 ADMISSION DATE:  11/04/2022, CONSULTATION DATE: November 23, 2022 REFERRING MD:  Dr. 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  Status post recent PEG tube placement and [facility].  Has chronic systolic heart failure.  Other medical problems include hypertension, hyperlipidemia, normocytic anemia, tobacco dependence, history of cocaine abuse in remission, history of alcohol abuse in remission.  Was discharged on 10/31/2022 to residential hospice.  Was transferred from there to [facility].  Ended up with PEG tube placement on 11/11/2022.  Admitted now with elevated white count normal lactic acid normal troponins.  However has lower lobe collapse on the right lung with consolidation, small effusion concerning for progressive pneumonia.  Patient was started on IV antibiotics.  Pulmonary critical care was consulted for recommendations.  Pertinent  Medical History   Past Medical History:  Diagnosis Date   Hypertension    Muscular dystrophy (HCC)    Substance abuse (HCC)      Significant Hospital Events: Including procedures, antibiotic start and stop dates in addition to other pertinent events     Interim History / Subjective:  Seen with family at bedside  Per HPI above  Objective   Blood pressure 106/83, pulse (!) 116, temperature 98.5 F (36.9 C), temperature source Oral, resp. rate 10, height 5\' 11"  (1.803 m), weight 59 kg, SpO2 99 %.    FiO2 (%):  [0 %] 0 %   Intake/Output Summary (Last 24 hours) at 11/23/2022 1619 Last data filed at 11/23/2022 1100 Gross per 24 hour  Intake 294.2 ml  Output 750 ml  Net -455.8 ml   Filed Weights   11/19/2022 0500 11/22/22 0409 11/23/22 0645  Weight: 63.5 kg 58.7 kg 59  kg    Examination: General: Frail middle-aged man in no acute distress HENT: Raspy voice, dry mucous membranes, temporal wasting Lungs: Scattered rhonchi and transmitted upper airway sounds, no accessory muscle use, shallow inspirations Cardiovascular: Heart sounds regular, extremities warm Abdomen: Soft, PEG in place Extremities: No edema, positive muscle wasting Neuro: Very weak proximal more than distal Skin no rashes  Resolved Hospital Problem list     Assessment & Plan:   Bilateral aspiration pneumonia, healthcare associated, present on admission Acute on chronic hypoxemic hypercarbic respiratory failure secondary to neuromuscular disease, progressive neuromuscular dystrophy Status post PEG tube placement Chronic systolic heart failure, appears euvolemic Leukocytosis secondary to above Acute on chronic anemia of chronic disease Mixed hyperlipidemia Chronic tobacco abuse, stopped smoking approximately 1 month ago. Severe protein calorie malnutrition, BMI 18, present on admission  Plan:  Agree with current antibiotic regimen, Zosyn plus vancomycin  Chest PT, CoughAssist to help mobilize secretions If he has worsening atelectasis or mucous plugging may benefit from bronchoscopy in the future.  Long-term due to the nature of his disease, he needs to decide on whether to pursue tracheostomy versus hospice.  While the tracheostomy would add years to his life, I worry that with the amount of pain he has this extra time may not be of the quality he is hoping for.  I think a multidisciplinary meeting with TRH, palliative, and pulmonary with the family and the patient to determine a final disposition would be useful in the coming week.   Labs   CBC: Recent Labs  Lab 10/29/2022 0312  WBC  11.5*  NEUTROABS 7.2  HGB 11.8*  HCT 38.5*  MCV 94.1  PLT 237    Basic Metabolic Panel: Recent Labs  Lab 10/26/2022 0312 11/01/2022 0524 10/30/2022 1645 11/22/22 0512 11/22/22 1637  11/23/22 0507  NA 140  --   --   --   --   --   K 3.9  --   --   --   --   --   CL 103  --   --   --   --   --   CO2 27  --   --   --   --   --   GLUCOSE 113*  --   --   --   --   --   BUN 16  --   --   --   --   --   CREATININE 0.68  --   --   --   --   --   CALCIUM 9.9  --   --   --   --   --   MG  --  1.8 2.2 2.2 2.0 2.3  PHOS  --  3.5 4.1 4.3 4.5 4.4   GFR: Estimated Creatinine Clearance: 94.2 mL/min (by C-G formula based on SCr of 0.68 mg/dL). Recent Labs  Lab 11/20/2022 0312 11/04/2022 0524 11/03/2022 2308  WBC 11.5*  --   --   LATICACIDVEN 1.1 0.9 1.3    Liver Function Tests: Recent Labs  Lab 10/30/2022 0312  AST 24  ALT 21  ALKPHOS 76  BILITOT 0.5  PROT 9.4*  ALBUMIN 3.6   No results for input(s): "LIPASE", "AMYLASE" in the last 168 hours. No results for input(s): "AMMONIA" in the last 168 hours.  ABG    Component Value Date/Time   PHART 7.280 (L) 09/12/2016 0341   PCO2ART 50.5 (H) 09/12/2016 0341   PO2ART 85.6 09/12/2016 0341   HCO3 27.4 10/25/2022 2011   TCO2 28 10/25/2022 2012   ACIDBASEDEF 2.7 (H) 09/12/2016 0341   O2SAT 43 10/25/2022 2011     Coagulation Profile: No results for input(s): "INR", "PROTIME" in the last 168 hours.  Cardiac Enzymes: No results for input(s): "CKTOTAL", "CKMB", "CKMBINDEX", "TROPONINI" in the last 168 hours.  HbA1C: No results found for: "HGBA1C"  CBG: Recent Labs  Lab 11/22/22 2010 11/22/22 2349 11/23/22 0407 11/23/22 0824 11/23/22 1159  GLUCAP 108* 137* 132* 134* 111*    Review of Systems:    Positive Symptoms in bold:  Constitutional fevers, chills, weight loss, fatigue, anorexia, malaise  Eyes decreased vision, double vision, eye irritation  Ears, Nose, Mouth, Throat sore throat, trouble swallowing, sinus congestion  Cardiovascular chest pain, paroxysmal nocturnal dyspnea, lower ext edema, palpitations   Respiratory SOB, cough, DOE, hemoptysis, wheezing  Gastrointestinal nausea, vomiting, diarrhea   Genitourinary burning with urination, trouble urinating  Musculoskeletal joint aches, joint swelling, back pain  Integumentary  rashes, skin lesions  Neurological focal weakness, focal numbness, trouble speaking, headaches  Psychiatric depression, anxiety, confusion  Endocrine polyuria, polydipsia, cold intolerance, heat intolerance  Hematologic abnormal bruising, abnormal bleeding, unexplained nose bleeds  Allergic/Immunologic recurrent infections, hives, swollen lymph nodes     Past Medical History:  He,  has a past medical history of Hypertension, Muscular dystrophy (HCC), and Substance abuse (HCC).   Surgical History:   Past Surgical History:  Procedure Laterality Date   NO PAST SURGERIES     OTHER SURGICAL HISTORY     No surgical history  Social History:   reports that he has been smoking cigarettes. He has a 12.50 pack-year smoking history. He has never used smokeless tobacco. He reports current alcohol use of about 6.0 standard drinks of alcohol per week. He reports that he does not currently use drugs after having used the following drugs: Cocaine.   Family History:  His family history includes Heart disease in his maternal grandmother and mother.   Allergies Allergies  Allergen Reactions   Lisinopril Cough     Home Medications  Prior to Admission medications   Medication Sig Start Date End Date Taking? Authorizing Provider  HYDROmorphone (DILAUDID) 8 MG tablet Take 8 mg by mouth every 4 (four) hours as needed for moderate pain. 11/19/22  Yes [provider]  fentaNYL (SUBLIMAZE) SOLN Inject 50-100 mcg into the vein every 30 (thirty) minutes as needed (sob). Patient not taking: Reported on 10/30/2022 10/31/22   Shelby Mattocks, DO  fentaNYL 10 mcg/ml SOLN infusion Inject 100-200 mcg/hr into the vein continuous. Patient not taking: Reported on 11/06/2022 10/31/22   Shelby Mattocks, DO  glycopyrrolate (ROBINUL) 0.2 MG/ML injection Inject 2 mLs (0.4 mg total)  into the vein 4 (four) times daily. Patient not taking: Reported on 11/19/2022 10/31/22   Shelby Mattocks, DO  ipratropium-albuterol (DUONEB) 0.5-2.5 (3) MG/3ML SOLN Take 3 mLs by nebulization every 8 (eight) hours as needed. 10/31/22   Shelby Mattocks, DO  lidocaine (LIDODERM) 5 % Place 1 patch onto the skin daily. Remove & Discard patch within 12 hours or as directed by MD Patient not taking: Reported on 11/11/2022 10/31/22   Shelby Mattocks, DO  LORazepam (ATIVAN) 0.5 MG tablet Take 1 tablet (0.5 mg total) by mouth every 4 (four) hours as needed for anxiety. Patient not taking: Reported on 10/28/2022 10/31/22   Shelby Mattocks, DO  LORazepam (ATIVAN) 2 MG/ML injection Inject 0.25 mLs (0.5 mg total) into the vein every 4 (four) hours as needed for anxiety. Patient not taking: Reported on 11/09/2022 10/31/22   Shelby Mattocks, DO  metoprolol tartrate (LOPRESSOR) 5 MG/5ML SOLN injection Inject 2.5 mLs (2.5 mg total) into the vein every 12 (twelve) hours. Patient not taking: Reported on 11/15/2022 10/31/22   Shelby Mattocks, DO  polyethylene glycol (MIRALAX / GLYCOLAX) 17 g packet Take 17 g by mouth daily as needed for moderate constipation. Patient not taking: Reported on 10/25/2022 10/31/22   Shelby Mattocks, DO     Critical care time: N/A

## 2022-11-23 NOTE — Progress Notes (Signed)
[id number] [employer] engineer, contracting [facility]) [facility] Liaison Note  Spoke with patient's wife who states "we are not interested in hospice services." Spouse is open to Palliative Care services after discharge. Per spouse, they would like hospital bed in home after discharge. Request relayed to TOC. Spouse and I did have conversation that [facility] would not be able to manage fentanyl pump in home after discharge, that would need to be hospice services.  Per spouse, she understands he cannot come home on fentanyl pump and "he will have to stay in the hospital."  Attending MD, PMT MD and TOC all updated on conversation.  [facility] will place referral for outpatient-based palliative services for patient after discharge.  Will follow for discharge dispo.  Please call if any changes or questions regarding outpatient based palliative services or hospice.  Doreatha Martin, RN, BSN [facility] Liaison [phone]

## 2022-11-23 NOTE — Progress Notes (Signed)
Pharmacy Antibiotic Note  Ronnie [name] is a 47 y.o. male admitted on 11/14/2022 with worsening shortness of breath. Patient recently admitted to [facility] and discharged to hospice on 6/7. In the interim, family wanted second opinion and patient went to [facility] where a PEG was placed. Patient with progressive muscular dystrophy. Ongoing aspiration. Patient has been on antibiotics for pneumonia. Pharmacy has been consulted for Zosyn dosing.  Plan: -Zosyn 3.375 g IV q8h extended infusion  Height: 5\' 11"  (180.3 cm) Weight: 59 kg (130 lb 1.1 oz) IBW/kg (Calculated) : 75.3  Temp (24hrs), Avg:98.1 F (36.7 C), Min:97.6 F (36.4 C), Max:98.5 F (36.9 C)  Recent Labs  Lab 11/16/2022 0312 11/03/2022 0524 11/04/2022 2308  WBC 11.5*  --   --   CREATININE 0.68  --   --   LATICACIDVEN 1.1 0.9 1.3    Estimated Creatinine Clearance: 94.2 mL/min (by C-G formula based on SCr of 0.68 mg/dL).    Allergies  Allergen Reactions   Lisinopril Cough    Antimicrobials this admission: Zosyn 6/30 >> Vancomycin 6/28 >> Cefepime 6/28 >> 6/30 Flagyl 6/28 >> 6/30   Dose adjustments this admission: NA  Microbiology results: 6/28 BCx: ngtd 6/29 MRSA PCR: negative  Thank you for allowing pharmacy to be a part of this patient's care.  Pricilla Riffle, PharmD, BCPS Clinical Pharmacist 11/23/2022 4:25 PM

## 2022-11-23 NOTE — Progress Notes (Signed)
NIF  -25 best effort

## 2022-11-23 NOTE — Plan of Care (Signed)

## 2022-11-23 NOTE — Progress Notes (Signed)
Daily Progress Note   Patient Name: Ronnie [name]       Date: 11/23/2022 DOB: 02-09-1976  Age: 47 y.o. MRN#: [id number] Attending Physician: Glade Lloyd, MD Primary Care Physician: Claiborne Rigg, NP Admit Date: 11/02/2022  Reason for Consultation/Follow-up: Establishing goals of care, Pain control, and Psychosocial/spiritual support  Subjective: Awake alert, resting in bed.  States that pain is well-controlled on PCA.  PCA settings noted.  States that he just has been feeling very hot today.  Has his covers off.  Length of Stay: 2  Current Medications: Scheduled Meds:  . fentaNYL   Intravenous Q4H  . free water  100 mL Per Tube Q4H  .  HYDROmorphone (DILAUDID) injection  1 mg Intravenous Once  . ipratropium-albuterol  3 mL Nebulization BID    Continuous Infusions: . ceFEPime (MAXIPIME) IV 2 g (11/23/22 1231)  . feeding supplement (OSMOLITE 1.5 CAL) 1,000 mL (11/22/22 2224)  . metronidazole 500 mg (11/23/22 0507)  . vancomycin 1,000 mg (11/23/22 0616)    PRN Meds: acetaminophen **OR** acetaminophen, diphenhydrAMINE **OR** diphenhydrAMINE, HYDROmorphone, ipratropium-albuterol, naloxone **AND** sodium chloride flush, prochlorperazine  Physical Exam         There is a generalized weakness and deconditioned Diminished breath sounds Awake alert answers appropriately Trace lower extremity edema Has PEG tube and is on tube feeds S1-S2  Vital Signs: BP 106/83 (BP Location: Right Arm)   Pulse (!) 116   Temp 98.5 F (36.9 C) (Oral)   Resp (!) 23   Ht 5\' 11"  (1.803 m)   Wt 59 kg   SpO2 100%   BMI 18.14 kg/m  SpO2: SpO2: 100 % O2 Device: O2 Device: Room Air O2 Flow Rate: O2 Flow Rate (L/min): 0 L/min  Intake/output summary:  Intake/Output Summary (Last 24 hours) at  11/23/2022 1330 Last data filed at 11/23/2022 1100 Gross per 24 hour  Intake 540.2 ml  Output 750 ml  Net -209.8 ml   LBM: Last BM Date : 11/22/22 Baseline Weight: Weight: 63.5 kg Most recent weight: Weight: 59 kg       Palliative Assessment/Data:      Patient Active Problem List   Diagnosis Date Noted  . HCAP (healthcare-associated pneumonia) 11/23/2022  . Normocytic anemia 11/06/2022  . Aspiration into lower respiratory tract 11/13/2022  . PEG (percutaneous endoscopic gastrostomy) status (  HCC) 11/20/2022  . Prolonged QT interval 11/17/2022  . Chronic respiratory failure with hypercapnia (HCC) 11/06/2022  . Palliative care status 10/31/2022  . Concern about end of life 10/30/2022  . Muscular dystrophy (HCC) 10/27/2022  . Chronic systolic heart failure (HCC) 10/27/2022  . Dysphagia 10/27/2022  . Lobar pneumonia, unspecified organism (HCC) 10/25/2022  . Mixed hyperlipidemia 06/12/2022  . Bulbar weakness (HCC) 11/09/2021  . Erectile dysfunction 08/23/2021  . Tobacco dependence due to cigarettes 06/22/2020  . Family history of early CAD 06/22/2020  . LVH (left ventricular hypertrophy) 05/26/2019  . Routine adult health maintenance 12/26/2016  . Sleep apnea 12/26/2016  . NICM (nonischemic cardiomyopathy) (HCC) 10/03/2016  . Essential hypertension 10/03/2016  . Encounter for orogastric (OG) tube placement   . Altered mental state 09/11/2016  . Acute kidney injury (HCC) 09/11/2016  . Acute respiratory failure (HCC) 09/11/2016  . Lactic acidosis 09/11/2016  . Smoker 09/11/2016  . ETOH abuse 09/11/2016  . Cocaine abuse (HCC) 09/11/2016  . Nondependent alcohol abuse, in remission 09/11/2016    Palliative Care Assessment & Plan   Patient Profile:    Assessment: 47 year old gentleman with life limiting illness of muscular dystrophy with resultant chronic respiratory failure and recurrent aspiration, chronic systolic heart failure dyslipidemia and anemia. Patient  well-known to palliative service: extensive goals of care discussions were done the last time the patient was in the hospital admitted under teaching service, goals were revised to comfort-care, fentanyl PCA was started and the patient was sent to [facility].  However, patient's family was not in favor of this approach, patient was discharged from [facility] and taken to [facility] for second opinion.  Over there, the patient was placed on PEG tube and started on tube feeds and transition to home.  He is admitted again with possible bilateral aspiration pneumonia, healthcare respiratory pneumonia with CT chest showing complete right lower lobe consolidation, superimposed right middle lobe bronchopneumonia, increased left lower lobe opacity and emphysema.  He remains on broad-spectrum antibiotics and IV fluids.  Recommendations/Plan: Palliative was consulted for pain and non-- pain symptom management in addition to figuring out goals of care and appropriate disposition options this time as well.  Hospice liaison's are also closely following.  Had an extensive talk with the patient today about his current condition and broad goals of care discussions were again undertaken.  Patient states that his wife and son are continuing to " take this rough".  Overall, he does not wish to override their wishes however he does want to remain as comfortable as possible and is well aware of the serious incurable nature of his illness.  Patient's wife is not interested in speaking with hospice approach as per correspondence with hospice liaison's.  Patient states that he is willing to go home with palliative services if he can come off of the fentanyl PCA and if his symptoms can still be managed.  We talked about continuing the fentanyl PCA and monitoring his total opioid needs for the next 24 to 48 hours and then seeing if transdermal fentanyl plus oral hydromorphone will be sufficient to keep his symptoms under control or not.   If he is able to transition to patch plus pill regimen, then we can consider discharge home with palliative.  He does not want to go to [facility] as this will upset his family.    Code Status:    Code Status Orders  (From admission, onward)  Start     Ordered   11/03/2022 0526  Do not attempt resuscitation (DNR)  Continuous       Question Answer Comment  If patient has no pulse and is not breathing Do Not Attempt Resuscitation   If patient has a pulse and/or is breathing: Medical Treatment Goals LIMITED ADDITIONAL INTERVENTIONS: Use medication/IV fluids and cardiac monitoring as indicated; Do not use intubation or mechanical ventilation (DNI), also provide comfort medications.  Transfer to Progressive/Stepdown as indicated, avoid Intensive Care.   Consent: Discussion documented in EHR or advanced directives reviewed      10/31/2022 0525           Code Status History     Date Active Date Inactive Code Status Order ID Comments User Context   10/26/2022 0934 11/01/2022 0442 DNR [id number]  Josephine Igo, DO Inpatient   10/25/2022 2257 10/26/2022 0934 Full Code [id number]  Tomma Lightning, MD ED   09/11/2016 1632 09/16/2016 2110 Full Code [id number]  Tobey Grim, NP ED       Prognosis:  Unable to determine  Discharge Planning: Home with Palliative Services  Care plan was discussed with  patient.   Thank you for allowing the Palliative Medicine Team to assist in the care of this patient. high MDM.      Greater than 50%  of this time was spent counseling and coordinating care related to the above assessment and plan.  Rosalin Hawking, MD  Please contact Palliative Medicine Team phone at [phone] for questions and concerns.

## 2022-11-23 NOTE — Progress Notes (Signed)
PROGRESS NOTE    Ronnie [name]  ZOX:[id number] DOB: 28-Oct-1975 DOA: 11/12/2022 PCP: Claiborne Rigg, NP   Brief Narrative:  47 y.o. male with medical history significant of hypertension, muscular dystrophy, chronic respiratory failure with hypercapnia, chronic systolic heart failure, hyperlipidemia, normocytic anemia, tobacco dependence in remission, cocaine abuse in remission, alcohol abuse in remission, sleep apnea with recent admission at [facility] and discharged on 10/31/2022 to residential hospice.  Unclear what happened but patient apparently ended up at [facility], had a PEG tube placed on 11/11/2022 and was discharged 2 days prior to presentation presented with worsening shortness of breath.  On presentation, white count was 11.5, lactic acid and troponins were normal. Portable 1 view chest radiograph show increased confluence of the right lower lobe collapse or consolidation and small right pleural effusion since 10/29/2022. CTA chest was negative for PE; complete right lower lobe consolidation, progressed since CT earlier this month; superimposed right middle lobe bronchopneumonia; minimal right pleural effusion; increased left lower lobe opacity; emphysema.  He was started on broad-spectrum antibiotics and IV fluids.  Assessment & Plan:   Possible bilateral aspiration pneumonia/healthcare respiratory pneumonia Chronic respiratory failure with hypercapnia and hypoxia Progressive muscular dystrophy Goals of care -Imaging as above: Showing worsening consolidation.  Currently on broad-spectrum antibiotics.  Patient will probably continue to aspirate.  Keep him strictly n.p.o. -He was discharged on 10/31/2022 from [facility] to residential hospice.  Family rescinded hospice subsequently took the patient to [facility] for second opinion; had a PEG tube placed on 11/11/2022 and was discharged 2 days prior to presentation and presented with worsening shortness of breath.   -Palliative care and hospice team  following: Patient is expressing interest that he wants to go home with hospice with fentanyl PCA pump if possible.  Currently on fentanyl PCA as per palliative care. -Overall prognosis is very poor.  Status post PEG tube placement -He is still aspirating.  He will probably keep aspirating if we continue PEG tube feeding but he wants to continue PEG feeding for now.  Will follow palliative care/hospice team recommendations.  Chronic systolic heart failure -Not volume overloaded.  Strict input output.  Daily weights.  Was given gentle hydration on presentation.  Leukocytosis -mild.  Will hold off on doing further blood work.  Anemia of chronic disease -From chronic illnesses.  Hemoglobin stable.  Monitor intermittently  Mixed hyperlipidemia -Not on any medical therapy.  Outpatient follow-up with PCP  Chronic tobacco use -Apparently stopped smoking a month ago  DVT prophylaxis: SCDs Code Status: DNR.  Patient apparently rescinded hospice status recently Family Communication: None at bedside Disposition Plan: Status is: Inpatient Remains inpatient appropriate because: Of severity of illness.  Possible discharge to home or residential hospice once arrangements have been made  Consultants: Palliative care/hospice team  Procedures: None  Antimicrobials:  Anti-infectives (From admission, onward)    Start     Dose/Rate Route Frequency Ordered Stop   11/22/2022 1800  vancomycin (VANCOCIN) IVPB 1000 mg/200 mL premix        1,000 mg 200 mL/hr over 60 Minutes Intravenous Every 12 hours 11/15/2022 0513     11/16/2022 1800  metroNIDAZOLE (FLAGYL) IVPB 500 mg        500 mg 100 mL/hr over 60 Minutes Intravenous Every 12 hours 11/22/2022 0846     11/04/2022 1300  ceFEPIme (MAXIPIME) 2 g in sodium chloride 0.9 % 100 mL IVPB       Note to Pharmacy: Please adjust dose as needed. TY.  2 g 200 mL/hr over 30 Minutes Intravenous Every 8 hours 11/10/2022 0846     10/28/2022 0515  vancomycin (VANCOCIN) IVPB  1000 mg/200 mL premix        1,000 mg 200 mL/hr over 60 Minutes Intravenous  Once 11/20/2022 0509 11/11/2022 0751   11/18/2022 0500  ceFEPIme (MAXIPIME) 2 g in sodium chloride 0.9 % 100 mL IVPB        2 g 200 mL/hr over 30 Minutes Intravenous  Once 11/02/2022 0450 11/17/2022 0609   11/14/2022 0500  metroNIDAZOLE (FLAGYL) IVPB 500 mg        500 mg 100 mL/hr over 60 Minutes Intravenous  Once 11/18/2022 0450 11/12/2022 0751        Subjective: Patient seen and examined at bedside.  Poor historian.  No agitation, seizures, vomiting reported.  Pain is better controlled after initiation of the PCA pump. Objective: Vitals:   11/23/22 0413 11/23/22 0645 11/23/22 0739 11/23/22 0741  BP:      Pulse:      Resp: 19     Temp:      TempSrc:      SpO2: 99%  99% 99%  Weight:  59 kg    Height:        Intake/Output Summary (Last 24 hours) at 11/23/2022 0845 Last data filed at 11/23/2022 0651 Gross per 24 hour  Intake 1083.44 ml  Output 850 ml  Net 233.44 ml    Filed Weights   10/31/2022 0500 11/22/22 0409 11/23/22 0645  Weight: 63.5 kg 58.7 kg 59 kg    Examination:  General: On room air currently.  No distress.  Extremely ill and deconditioned looking.  Very thinly built.  Slow to respond.  Poor historian.  Flat affect. respiratory: Decreased breath sounds at bases bilaterally with some crackles CVS: Currently rate controlled; S1-S2 heard  abdominal: Soft, nontender, slightly distended, no organomegaly; normal bowel sounds are heard.  PEG tube present. extremities: Trace lower extremity edema; no clubbing.      Data Reviewed: I have personally reviewed following labs and imaging studies  CBC: Recent Labs  Lab 11/03/2022 0312  WBC 11.5*  NEUTROABS 7.2  HGB 11.8*  HCT 38.5*  MCV 94.1  PLT 237    Basic Metabolic Panel: Recent Labs  Lab 11/06/2022 0312 11/02/2022 0524 11/16/2022 1645 11/22/22 0512 11/22/22 1637 11/23/22 0507  NA 140  --   --   --   --   --   K 3.9  --   --   --   --   --    CL 103  --   --   --   --   --   CO2 27  --   --   --   --   --   GLUCOSE 113*  --   --   --   --   --   BUN 16  --   --   --   --   --   CREATININE 0.68  --   --   --   --   --   CALCIUM 9.9  --   --   --   --   --   MG  --  1.8 2.2 2.2 2.0 2.3  PHOS  --  3.5 4.1 4.3 4.5 4.4    GFR: Estimated Creatinine Clearance: 94.2 mL/min (by C-G formula based on SCr of 0.68 mg/dL). Liver Function Tests: Recent Labs  Lab 11/14/2022 0312  AST 24  ALT 21  ALKPHOS 76  BILITOT 0.5  PROT 9.4*  ALBUMIN 3.6    No results for input(s): "LIPASE", "AMYLASE" in the last 168 hours. No results for input(s): "AMMONIA" in the last 168 hours. Coagulation Profile: No results for input(s): "INR", "PROTIME" in the last 168 hours. Cardiac Enzymes: No results for input(s): "CKTOTAL", "CKMB", "CKMBINDEX", "TROPONINI" in the last 168 hours. BNP (last 3 results) No results for input(s): "PROBNP" in the last 8760 hours. HbA1C: No results for input(s): "HGBA1C" in the last 72 hours. CBG: Recent Labs  Lab 11/22/22 1735 11/22/22 2010 11/22/22 2349 11/23/22 0407 11/23/22 0824  GLUCAP 120* 108* 137* 132* 134*    Lipid Profile: No results for input(s): "CHOL", "HDL", "LDLCALC", "TRIG", "CHOLHDL", "LDLDIRECT" in the last 72 hours. Thyroid Function Tests: No results for input(s): "TSH", "T4TOTAL", "FREET4", "T3FREE", "THYROIDAB" in the last 72 hours. Anemia Panel: No results for input(s): "VITAMINB12", "FOLATE", "FERRITIN", "TIBC", "IRON", "RETICCTPCT" in the last 72 hours. Sepsis Labs: Recent Labs  Lab 11/20/2022 0312 10/30/2022 0524 10/30/2022 2308  LATICACIDVEN 1.1 0.9 1.3     Recent Results (from the past 240 hour(s))  Blood culture (routine x 2)     Status: None (Preliminary result)   Collection Time: 10/30/2022  3:12 AM   Specimen: BLOOD  Result Value Ref Range Status   Specimen Description   Final    BLOOD RIGHT ANTECUBITAL Performed at [facility], [address]  [place], [place] [address]    Special Requests   Final    BOTTLES DRAWN AEROBIC AND ANAEROBIC Blood Culture adequate volume Performed at [facility], [address] [place], [place] [address]    Culture   Final    NO GROWTH 1 DAY Performed at [facility], [address], [place], [place] [address]    Report Status PENDING  Incomplete  Blood culture (routine x 2)     Status: None (Preliminary result)   Collection Time: 11/07/2022  3:40 AM   Specimen: BLOOD  Result Value Ref Range Status   Specimen Description   Final    BLOOD BLOOD LEFT FOREARM Performed at [facility], [address], [place], [place] [address]    Special Requests   Final    BOTTLES DRAWN AEROBIC AND ANAEROBIC Blood Culture adequate volume Performed at [facility], [address] [place], [place] [address]    Culture   Final    NO GROWTH 1 DAY Performed at [facility], [address] [place], [place] [address]    Report Status PENDING  Incomplete  MRSA Next Gen by PCR, Nasal     Status: None   Collection Time: 11/22/22 12:49 PM   Specimen: Nasal Mucosa; Nasal Swab  Result Value Ref Range Status   MRSA by PCR Next Gen NOT DETECTED NOT DETECTED Final    Comment: (NOTE) The GeneXpert MRSA Assay (FDA approved for NASAL specimens only), is one component of a comprehensive MRSA colonization surveillance program. It is not intended to diagnose MRSA infection nor to guide or monitor treatment for MRSA infections. Test performance is not FDA approved in patients less than 43 years old. Performed at [facility], [address], [place], [place] [address]          Radiology Studies: No results found.      Scheduled Meds:  fentaNYL   Intravenous Q4H   free water  100 mL Per Tube Q4H  HYDROmorphone (DILAUDID) injection  1 mg Intravenous Once   ipratropium-albuterol  3 mL Nebulization BID   Continuous Infusions:   ceFEPime (MAXIPIME) IV 2 g (11/23/22 0411)   feeding supplement (OSMOLITE 1.5 CAL) 1,000 mL (11/22/22 2224)   metronidazole 500 mg (11/23/22 0507)   vancomycin 1,000 mg (11/23/22 8119)          Glade Lloyd, MD [facility] 11/23/2022, 8:45 AM

## 2022-11-24 DIAGNOSIS — J189 Pneumonia, unspecified organism: Secondary | ICD-10-CM | POA: Diagnosis not present

## 2022-11-24 DIAGNOSIS — J9612 Chronic respiratory failure with hypercapnia: Secondary | ICD-10-CM | POA: Diagnosis not present

## 2022-11-24 DIAGNOSIS — I5022 Chronic systolic (congestive) heart failure: Secondary | ICD-10-CM

## 2022-11-24 DIAGNOSIS — Z931 Gastrostomy status: Secondary | ICD-10-CM | POA: Diagnosis not present

## 2022-11-24 LAB — GLUCOSE, CAPILLARY
Glucose-Capillary: 117 mg/dL — ABNORMAL HIGH (ref 70–99)
Glucose-Capillary: 123 mg/dL — ABNORMAL HIGH (ref 70–99)
Glucose-Capillary: 129 mg/dL — ABNORMAL HIGH (ref 70–99)
Glucose-Capillary: 131 mg/dL — ABNORMAL HIGH (ref 70–99)
Glucose-Capillary: 133 mg/dL — ABNORMAL HIGH (ref 70–99)
Glucose-Capillary: 138 mg/dL — ABNORMAL HIGH (ref 70–99)

## 2022-11-24 LAB — CREATININE, SERUM
Creatinine, Ser: 0.48 mg/dL — ABNORMAL LOW (ref 0.61–1.24)
GFR, Estimated: >60 mL/min (ref >60)

## 2022-11-24 LAB — CULTURE, BLOOD (ROUTINE X 2)

## 2022-11-24 NOTE — Progress Notes (Signed)
PROGRESS NOTE    Ronnie [name]  VHQ:[id number] DOB: 1976/02/13 DOA: 11/09/2022 PCP: Claiborne Rigg, NP   Brief Narrative:  47 y.o. male with medical history significant of hypertension, muscular dystrophy, chronic respiratory failure with hypercapnia, chronic systolic heart failure, hyperlipidemia, normocytic anemia, tobacco dependence in remission, cocaine abuse in remission, alcohol abuse in remission, sleep apnea with recent admission at [facility] and discharged on 10/31/2022 to residential hospice.  Unclear what happened but patient apparently ended up at [facility], had a PEG tube placed on 11/11/2022 and was discharged 2 days prior to presentation presented with worsening shortness of breath.  On presentation, white count was 11.5, lactic acid and troponins were normal. Portable 1 view chest radiograph show increased confluence of the right lower lobe collapse or consolidation and small right pleural effusion since 10/29/2022. CTA chest was negative for PE; complete right lower lobe consolidation, progressed since CT earlier this month; superimposed right middle lobe bronchopneumonia; minimal right pleural effusion; increased left lower lobe opacity; emphysema.  He was started on broad-spectrum antibiotics and IV fluids.  Palliative care and subsequently pulmonary were consulted.  Assessment & Plan:   Possible bilateral aspiration pneumonia/healthcare respiratory pneumonia Chronic respiratory failure with hypercapnia and hypoxia Progressive muscular dystrophy Goals of care -Imaging as above: Showing worsening consolidation.  Currently on broad-spectrum antibiotics.  Patient will probably continue to aspirate.  Keep him strictly n.p.o. -He was discharged on 10/31/2022 from [facility] to residential hospice.  Family rescinded hospice and subsequently took the patient to [facility] for second opinion; had a PEG tube placed on 11/11/2022 and was discharged 2 days prior to presentation and presented with  worsening shortness of breath.   -Palliative care and hospice team following: Patient initially expressed that he wanted to go to residential hospice but then subsequently after discussion with family, patient/family wanted to go home with appropriate pain medications but not with hospice services.  Currently on fentanyl PCA as per palliative care.  Also on as needed Dilaudid.  I had a long conversation with patient/wife at bedside and sister on phone on 11/23/2022: Patient/family hopeful that he will have some recovery of pneumonia with treatment and they told me that the antibiotics at [facility] had worked and [employer] had told him that the pneumonia was gone and he was on the back to recovery.  They are still hopeful for him to get slightly better and go home.  They were wondering if patient could be evaluated by SLP and PT.  I explained to them that he will probably keep aspirating his own saliva, gastric juices and tube feeding.  They had questions regarding if anything else could be done for the pneumonia.  I subsequently changed his antibiotics to Zosyn and they were agreeable.  They also agreed for pulmonary evaluation.  I spoke to Dr. Reuel Boom Smith/PCCM who evaluated the patient as well. -Overall prognosis is very poor.  Status post PEG tube placement -He is still aspirating.  He will probably keep aspirating if we continue PEG tube feeding but he wants to continue PEG feeding for now.  Discussion as above.  Chronic systolic heart failure -Not volume overloaded.  Strict input output.  Daily weights.  Was given gentle hydration on presentation.  Leukocytosis -mild.  Repeat a.m. labs as patient/family not interested in hospice at this time  Anemia of chronic disease -From chronic illnesses.  Hemoglobin stable.  Monitor intermittently  Mixed hyperlipidemia -Not on any medical therapy.  Outpatient follow-up with PCP  Chronic tobacco  use -Apparently stopped smoking a month ago  DVT  prophylaxis: SCDs Code Status: DNR.  Patient apparently rescinded hospice status recently Family Communication: Wife at bedside and sister on phone on 11/23/2022 Disposition Plan: Status is: Inpatient Remains inpatient appropriate because: Of severity of illness.     Consultants: Palliative care/hospice team/PCCM  Procedures: None  Antimicrobials:  Anti-infectives (From admission, onward)    Start     Dose/Rate Route Frequency Ordered Stop   11/23/22 1800  piperacillin-tazobactam (ZOSYN) IVPB 3.375 g        3.375 g 12.5 mL/hr over 240 Minutes Intravenous Every 8 hours 11/23/22 1616     11/04/2022 1800  vancomycin (VANCOCIN) IVPB 1000 mg/200 mL premix        1,000 mg 200 mL/hr over 60 Minutes Intravenous Every 12 hours 11/02/2022 0513     11/02/2022 1800  metroNIDAZOLE (FLAGYL) IVPB 500 mg  Status:  Discontinued        500 mg 100 mL/hr over 60 Minutes Intravenous Every 12 hours 11/05/2022 0846 11/23/22 1605   11/12/2022 1300  ceFEPIme (MAXIPIME) 2 g in sodium chloride 0.9 % 100 mL IVPB  Status:  Discontinued       Note to Pharmacy: Please adjust dose as needed. TY.   2 g 200 mL/hr over 30 Minutes Intravenous Every 8 hours 11/12/2022 0846 11/23/22 1605   11/03/2022 0515  vancomycin (VANCOCIN) IVPB 1000 mg/200 mL premix        1,000 mg 200 mL/hr over 60 Minutes Intravenous  Once 11/20/2022 0509 11/18/2022 0751   11/04/2022 0500  ceFEPIme (MAXIPIME) 2 g in sodium chloride 0.9 % 100 mL IVPB        2 g 200 mL/hr over 30 Minutes Intravenous  Once 11/13/2022 0450 11/14/2022 0609   11/14/2022 0500  metroNIDAZOLE (FLAGYL) IVPB 500 mg        500 mg 100 mL/hr over 60 Minutes Intravenous  Once 11/03/2022 0450 11/16/2022 0751        Subjective: Patient seen and examined at bedside.  Still complains of intermittent pain but PCA pump is helping.  No fever, seizures, agitation or vomiting reported.   Objective: Vitals:   11/23/22 1956 11/23/22 2111 11/24/22 0054 11/24/22 0321  BP: 102/83  104/86 101/78  Pulse:       Resp: 18  16 18   Temp: 98.2 F (36.8 C)  98.1 F (36.7 C) 98.8 F (37.1 C)  TempSrc: Oral  Oral Oral  SpO2: 100% 100% 100% 100%  Weight:      Height:        Intake/Output Summary (Last 24 hours) at 11/24/2022 0807 Last data filed at 11/24/2022 1610 Gross per 24 hour  Intake 1969.15 ml  Output 850 ml  Net 1119.15 ml    Filed Weights   11/13/2022 0500 11/22/22 0409 11/23/22 0645  Weight: 63.5 kg 58.7 kg 59 kg    Examination:  General: On 2 to 3 L oxygen via nasal cannula currently.  No distress.  Looks chronically ill and deconditioned.  Extremely thinly built. ENT/neck: No thyromegaly.  JVD is not elevated  respiratory: Decreased breath sounds at bases bilaterally with scattered crackles; no wheezing  CVS: S1-S2 heard, rate controlled Abdominal: Soft, nontender, mildly distended; no organomegaly, bowel sounds are heard.  PEG tube present. Extremities: Mild lower extremity edema; no cyanosis  CNS: Awake; slow to respond.  Poor historian.  No focal neurologic deficit.  Moves extremities Lymph: No obvious lymphadenopathy Skin: No obvious ecchymosis/lesions  psych: Extremely flat  affect.  Not agitated.   Musculoskeletal: No obvious joint swelling/deformity     Data Reviewed: I have personally reviewed following labs and imaging studies  CBC: Recent Labs  Lab 10/28/2022 0312  WBC 11.5*  NEUTROABS 7.2  HGB 11.8*  HCT 38.5*  MCV 94.1  PLT 237    Basic Metabolic Panel: Recent Labs  Lab 11/17/2022 0312 11/02/2022 0524 10/29/2022 1645 11/22/22 0512 11/22/22 1637 11/23/22 0507 11/24/22 0350  NA 140  --   --   --   --   --   --   K 3.9  --   --   --   --   --   --   CL 103  --   --   --   --   --   --   CO2 27  --   --   --   --   --   --   GLUCOSE 113*  --   --   --   --   --   --   BUN 16  --   --   --   --   --   --   CREATININE 0.68  --   --   --   --   --  0.48*  CALCIUM 9.9  --   --   --   --   --   --   MG  --  1.8 2.2 2.2 2.0 2.3  --   PHOS  --  3.5 4.1 4.3  4.5 4.4  --     GFR: Estimated Creatinine Clearance: 94.2 mL/min (A) (by C-G formula based on SCr of 0.48 mg/dL (L)). Liver Function Tests: Recent Labs  Lab 11/01/2022 0312  AST 24  ALT 21  ALKPHOS 76  BILITOT 0.5  PROT 9.4*  ALBUMIN 3.6    No results for input(s): "LIPASE", "AMYLASE" in the last 168 hours. No results for input(s): "AMMONIA" in the last 168 hours. Coagulation Profile: No results for input(s): "INR", "PROTIME" in the last 168 hours. Cardiac Enzymes: No results for input(s): "CKTOTAL", "CKMB", "CKMBINDEX", "TROPONINI" in the last 168 hours. BNP (last 3 results) No results for input(s): "PROBNP" in the last 8760 hours. HbA1C: No results for input(s): "HGBA1C" in the last 72 hours. CBG: Recent Labs  Lab 11/23/22 1730 11/23/22 2036 11/24/22 0052 11/24/22 0320 11/24/22 0742  GLUCAP 123* 141* 123* 117* 133*    Lipid Profile: No results for input(s): "CHOL", "HDL", "LDLCALC", "TRIG", "CHOLHDL", "LDLDIRECT" in the last 72 hours. Thyroid Function Tests: No results for input(s): "TSH", "T4TOTAL", "FREET4", "T3FREE", "THYROIDAB" in the last 72 hours. Anemia Panel: No results for input(s): "VITAMINB12", "FOLATE", "FERRITIN", "TIBC", "IRON", "RETICCTPCT" in the last 72 hours. Sepsis Labs: Recent Labs  Lab 10/28/2022 0312 11/06/2022 0524 11/10/2022 2308  LATICACIDVEN 1.1 0.9 1.3     Recent Results (from the past 240 hour(s))  Blood culture (routine x 2)     Status: None (Preliminary result)   Collection Time: 10/27/2022  3:12 AM   Specimen: BLOOD  Result Value Ref Range Status   Specimen Description   Final    BLOOD RIGHT ANTECUBITAL Performed at [facility], [address] [place], [place] [address]    Special Requests   Final    BOTTLES DRAWN AEROBIC AND ANAEROBIC Blood Culture adequate volume Performed at [facility], [address] [place], [place] [address]    Culture   Final    NO GROWTH  2 DAYS Performed at  [facility], [address], [place], [place] [address]    Report Status PENDING  Incomplete  Blood culture (routine x 2)     Status: None (Preliminary result)   Collection Time: 10/25/2022  3:40 AM   Specimen: BLOOD  Result Value Ref Range Status   Specimen Description   Final    BLOOD BLOOD LEFT FOREARM Performed at [facility], [address] [place], [place] [address]    Special Requests   Final    BOTTLES DRAWN AEROBIC AND ANAEROBIC Blood Culture adequate volume Performed at [facility], [address], [place], [place] [address]    Culture   Final    NO GROWTH 2 DAYS Performed at [facility], [address], [place], [place] [address]    Report Status PENDING  Incomplete  MRSA Next Gen by PCR, Nasal     Status: None   Collection Time: 11/22/22 12:49 PM   Specimen: Nasal Mucosa; Nasal Swab  Result Value Ref Range Status   MRSA by PCR Next Gen NOT DETECTED NOT DETECTED Final    Comment: (NOTE) The GeneXpert MRSA Assay (FDA approved for NASAL specimens only), is one component of a comprehensive MRSA colonization surveillance program. It is not intended to diagnose MRSA infection nor to guide or monitor treatment for MRSA infections. Test performance is not FDA approved in patients less than 46 years old. Performed at [facility], [address] [place], [place] [address]          Radiology Studies: No results found.      Scheduled Meds:  fentaNYL   Intravenous Q4H   free water  100 mL Per Tube Q4H    HYDROmorphone (DILAUDID) injection  1 mg Intravenous Once   ipratropium-albuterol  3 mL Nebulization BID   sodium chloride HYPERTONIC  4 mL Nebulization BID   Continuous Infusions:  feeding supplement (OSMOLITE 1.5 CAL) 1,000 mL (11/23/22 1614)   piperacillin-tazobactam (ZOSYN)  IV 3.375 g (11/24/22 0641)   vancomycin 1,000 mg (11/24/22 0514)          Glade Lloyd, MD [facility] 11/24/2022, 8:07 AM

## 2022-11-24 NOTE — Evaluation (Signed)
Physical Therapy Evaluation Patient Details Name: Ronnie [name] MRN: [id number] DOB: 06/23/1975 Today's Date: 11/24/2022  History of Present Illness  47 yo male presents to therapy s/p hospital admission on 11/20/2022 due to elevated WBC count and pt was found to have PNA, R lower lobe collapse and small pleural effusion and pt started on IV ABX. Pt PMH includes but is not limited to: aspiration PNA, MD, chronic hypercapnic respiratory failure, HTN, HDL, sCHF, protein calorie malnutrition, anemia, substance abuse. Pt d/c from hospital on 10/31/2022 to residential hospice. 11/11/2022 pt transferred to [facility] and PEG tube placement.  Clinical Impression    Pt admitted with above diagnosis.  Pt currently with functional limitations due to the deficits listed below (see PT Problem List). Pt in bed when PT arrived. Pt agreeable to therapy intervention and getting OOB. Pt required mod A for supine to sit and to scoot to EOB, mod A for initial sitting balance and progressed to min guard, mod A for pull to sit to stand from elevated EOB to RW, min A for balance once standing and min A to complete SPT bed to recliner. Pt exhibited poor activity tolerance and S and S of SOB with use of accessory mm for breathing strategies on 3 L/min and no supplemental O2 at baseline, 97% with exertion. Pt left seated in recliner and all needs in place. No increase in pain, no reports of dizziness. Pt will benefit from acute skilled PT to increase their independence and safety with mobility to allow discharge.  PT under impression pt will d/c home with family support and palliative services, may benefit from ongoing therapy services in home setting.       Recommendations for follow up therapy are one component of a multi-disciplinary discharge planning process, led by the attending physician.  Recommendations may be updated based on patient status, additional functional criteria and insurance authorization.  Follow Up Recommendations        Assistance Recommended at Discharge Frequent or constant Supervision/Assistance  Patient can return home with the following  A lot of help with walking and/or transfers;A lot of help with bathing/dressing/bathroom;Assistance with cooking/housework;Assist for transportation;Help with stairs or ramp for entrance    Equipment Recommendations Rolling walker (2 wheels) (pending wc order from PCP per pt report)  Recommendations for Other Services       Functional Status Assessment Patient has had a recent decline in their functional status and demonstrates the ability to make significant improvements in function in a reasonable and predictable amount of time.     Precautions / Restrictions Precautions Precautions: Fall Restrictions Weight Bearing Restrictions: No      Mobility  Bed Mobility Overal bed mobility: Needs Assistance Bed Mobility: Supine to Sit     Supine to sit: Mod assist     General bed mobility comments: HOB elevated, increased time, mod A for scooting to EOB with use of bed pad    Transfers Overall transfer level: Needs assistance Equipment used: Rolling walker (2 wheels) Transfers: Bed to chair/wheelchair/BSC   Stand pivot transfers: Mod assist, From elevated surface         General transfer comment: pull to stand at [facility] with mod A, cues and increased time, once in standing min A to complete SPT to recliner, significant L toe out    Ambulation/Gait               General Gait Details: NT due to reports of fatigue and S and S of SOB with use  of accessory mm 97% on 3 L/min with exertion  Stairs            Wheelchair Mobility    Modified Rankin (Stroke Patients Only)       Balance                                             Pertinent Vitals/Pain Pain Assessment Pain Assessment: 0-10 Pain Score: 8  Pain Location: back and B proximal LEs Pain Descriptors / Indicators: Constant, Aching, Discomfort Pain  Intervention(s): Limited activity within patient's tolerance, Monitored during session, Premedicated before session, Repositioned    Home Living Family/patient expects to be discharged to:: Private residence Living Arrangements: Spouse/significant other;Children Available Help at Discharge: Family Type of Home: Apartment Home Access: Level entry;Elevator (pt reports long hallway to access elevator)       Home Layout: One level Home Equipment: Cane - single point Additional Comments: pt reports his PCP will provide order for power wc and needs tub/shower bench    Prior Function Prior Level of Function : Needs assist       Physical Assist : ADLs (physical)   ADLs (physical): Bathing;IADLs Mobility Comments: pt reports prior to hospitalization 1 month ago amb with SPC.       Hand Dominance   Dominant Hand: Right    Extremity/Trunk Assessment   Upper Extremity Assessment Upper Extremity Assessment: Generalized weakness    Lower Extremity Assessment Lower Extremity Assessment: Generalized weakness (denies abn sensation B LEs)    Cervical / Trunk Assessment Cervical / Trunk Assessment: Kyphotic  Communication   Communication: No difficulties (soft spoken)  Cognition Arousal/Alertness: Awake/alert Behavior During Therapy: WFL for tasks assessed/performed Overall Cognitive Status: Within Functional Limits for tasks assessed                                          General Comments      Exercises     Assessment/Plan    PT Assessment Patient needs continued PT services  PT Problem List Decreased strength;Decreased activity tolerance;Decreased balance;Decreased mobility;Decreased coordination;Pain       PT Treatment Interventions DME instruction;Gait training;Functional mobility training;Therapeutic activities;Therapeutic exercise;Balance training;Neuromuscular re-education;Patient/family education    PT Goals (Current goals can be found in the  Care Plan section)  Acute Rehab PT Goals Patient Stated Goal: to go home PT Goal Formulation: With patient Time For Goal Achievement: 12/08/22 Potential to Achieve Goals: Fair    Frequency Min 1X/week     Co-evaluation               AM-PAC PT "6 Clicks" Mobility  Outcome Measure Help needed turning from your back to your side while in a flat bed without using bedrails?: A Little Help needed moving from lying on your back to sitting on the side of a flat bed without using bedrails?: A Lot Help needed moving to and from a bed to a chair (including a wheelchair)?: A Lot Help needed standing up from a chair using your arms (e.g., wheelchair or bedside chair)?: A Lot Help needed to walk in hospital room?: Total Help needed climbing 3-5 steps with a railing? : Total 6 Click Score: 11    End of Session Equipment Utilized During Treatment: Gait belt;Oxygen Activity Tolerance: No increased pain;Patient  limited by fatigue Patient left: in chair;with call bell/phone within reach Nurse Communication: Mobility status PT Visit Diagnosis: Unsteadiness on feet (R26.81);Other abnormalities of gait and mobility (R26.89);Muscle weakness (generalized) (M62.81);Pain Pain - part of body:  (back, B LEs)    Time: 1610-9604 PT Time Calculation (min) (ACUTE ONLY): 41 min   Charges:   PT Evaluation $PT Eval Moderate Complexity: 1 Mod PT Treatments $Therapeutic Activity: 23-37 mins        Johnny Bridge, PT Acute Rehab   Jacqualyn Posey 11/24/2022, 11:49 AM

## 2022-11-24 NOTE — Evaluation (Signed)
Clinical/Bedside Swallow Evaluation Patient Details  Name: Ronnie [name] MRN: [id number] Date of Birth: 1975/06/25  Today's Date: 11/24/2022 Time: SLP Start Time (ACUTE ONLY): 0920 SLP Stop Time (ACUTE ONLY): 1020 SLP Time Calculation (min) (ACUTE ONLY): 60 min  Past Medical History:  Past Medical History:  Diagnosis Date   Hypertension    Muscular dystrophy (HCC)    Substance abuse (HCC)    Past Surgical History:  Past Surgical History:  Procedure Laterality Date   NO PAST SURGERIES     OTHER SURGICAL HISTORY     No surgical history    HPI:  Patient is a 47 y.o. male with PMH: muscular dystrophy, chronic respiratory failure with hypercapnia, CHF, HLD, normocytic anemia, tobacco dependence in remission, cocaine abuse in remission, alcohol abuse in remission, sleep apnea with recent admission at [facility] and discharged on 10/31/2022 to residential hospice. Family was apparently not in favor of hospice approach and so patient was discharged from [facility] (residential hospice) to [facility] for second opinion. He had a PEG placed while at [facility] and started on tube feedings and transitioned to home. He presented to hospital on 11/12/2022 with possible bilateral PNA, healthcare acquired respiratory PNA with CT chest showing complete RLL consolidation, superimposed RML bronchopneumonia, increased LLL opacity and emphysema.    Assessment / Plan / Recommendation  Clinical Impression  Patient with h/o severe pharyngeal phase dysphagia as documented in SLP Modified Barium swallow study note 10/26/2022 as well as documented when patient was in hospital at [facility]. During today's bedside swallow evaluation, SLP spent extensive amount of time talking to patient about his recent swallow history, his current overall wishes/desires regarding swallowing. He does not wish to have another MBS and based on 10/26/22 MBS results, it is more likely that patient's swallow function has declined rather than  improved and repeat MBS is not indicated at this time. Patient told SLP that the last time he has had any PO's was a few weeks ago when he was at [facility]. He reported that his vocal quality which is hoarse, has been going on "for a while" then clarifying when asked, "for months".  He does feel that he is more aware of what is going on with his swallow. He tries not to swallow his saliva and instead spits it out. He did indicate that ice chips might be pleasurable to him and that he had been told swallowing the water from ice chips helps "exercise the muscles", which is accurate. SLP then assessed patient's toleration of single ice chips with patient self-feeding with a spoon. He performs a chin tuck posture when swallowing which appears to be an acquired strategy. No throat clearing or coughing observed but patient indicating "sometimes it belches" and also states that sometimes swallow feels fine and other times it feels like it is going down the wrong way. Patient does wish to have ice chips PRN and SLP in agreement with this. Unfortunately, patient did not have benefit from swallowing exercises as documented in notes from [facility] hospital and confirmed by patient himself. He would benefit from a comfort based approach to PO's which would likely include allowing patient to taste and chew foods without swallowing. SLP to s/o at this time, however if patient's POC changes to comfort, he may benefit from SLP services to assist with comfort PO decisions. SLP Visit Diagnosis: Dysphagia, oropharyngeal phase (R13.12)    Aspiration Risk  Severe aspiration risk    Diet Recommendation NPO;Ice chips PRN after oral care  Medication Administration: Via alternative means Postural Changes: Seated upright at 90 degrees    Other  Recommendations Oral Care Recommendations: Oral care BID;Oral care prior to ice chip/H20;Patient independent with oral care    Recommendations for follow up therapy are one component of a  multi-disciplinary discharge planning process, led by the attending physician.  Recommendations may be updated based on patient status, additional functional criteria and insurance authorization.  Follow up Recommendations No SLP follow up      Assistance Recommended at Discharge    Functional Status Assessment Patient has not had a recent decline in their functional status  Frequency and Duration     N/A       Prognosis   N/A     Swallow Study   General Date of Onset: 11/11/2022 HPI: Patient is a 47 y.o. male with PMH: muscular dystrophy, chronic respiratory failure with hypercapnia, CHF, HLD, normocytic anemia, tobacco dependence in remission, cocaine abuse in remission, alcohol abuse in remission, sleep apnea with recent admission at [facility] health and discharged on 10/31/2022 to residential hospice. Family was apparently not in favor of hospice approach and so patient was discharged from [facility] (residential hospice) to [facility] for second opinion. He had a PEG placed while at [facility] and started on tube feedings and transitioned to home. He presented to hospital on 10/29/2022 with possible bilateral PNA, healthcare acquired respiratory PNA with CT chest showing complete RLL consolidation, superimposed RML bronchopneumonia, increased LLL opacity and emphysema. Type of Study: Bedside Swallow Evaluation Previous Swallow Assessment: during previous admission, BSE and MBS 10/26/22 Diet Prior to this Study: NPO Temperature Spikes Noted: No Respiratory Status: Nasal cannula History of Recent Intubation: No Behavior/Cognition: Alert;Cooperative;Pleasant mood Oral Cavity Assessment: Within Functional Limits Oral Care Completed by SLP: No Oral Cavity - Dentition: Adequate natural dentition Vision: Functional for self-feeding Self-Feeding Abilities: Able to feed self Patient Positioning: Upright in bed Baseline Vocal Quality: Hoarse;Breathy Volitional Cough: Weak    Oral/Motor/Sensory Function Overall  Oral Motor/Sensory Function: Within functional limits   Ice Chips Ice chips: Impaired Presentation: Self Fed;Spoon Pharyngeal Phase Impairments: Other (comments) Other Comments: no overt s/s but patient reporting "sometimes it belches"; he also states that sometimes swallow feels fine and other times it feels like it is going down the wrong way   Thin Liquid Thin Liquid: Not tested    Nectar Thick Nectar Thick Liquid: Not tested   Honey Thick Honey Thick Liquid: Not tested   Puree Puree: Not tested   Solid     Solid: Not tested     Angela Nevin, MA, CCC-SLP Speech Therapy

## 2022-11-24 NOTE — Progress Notes (Signed)
NIF -20 pt gave good effort.

## 2022-11-24 NOTE — Plan of Care (Signed)

## 2022-11-24 NOTE — Progress Notes (Signed)
Daily Progress Note   Patient Name: Ronnie [name]       Date: 11/24/2022 DOB: [birth year]  Age: 47 y.o. MRN#: [id number] Attending Physician: Glade Lloyd, MD Primary Care Physician: Claiborne Rigg, NP Admit Date: 10/30/2022  Reason for Consultation/Follow-up: Establishing goals of care, Pain control, and Psychosocial/spiritual support  Subjective: Awake alert, resting in bed.  States that pain is well-controlled on PCA.  PCA settings noted. Not in distress, no family at bedside at time of my visit, discussed with nursing staff.   Length of Stay: 3  Current Medications: Scheduled Meds:   fentaNYL   Intravenous Q4H   free water  100 mL Per Tube Q4H    HYDROmorphone (DILAUDID) injection  1 mg Intravenous Once   ipratropium-albuterol  3 mL Nebulization BID   sodium chloride HYPERTONIC  4 mL Nebulization BID    Continuous Infusions:  feeding supplement (OSMOLITE 1.5 CAL) 1,000 mL (11/24/22 1144)   piperacillin-tazobactam (ZOSYN)  IV 3.375 g (11/24/22 0641)    PRN Meds: acetaminophen **OR** acetaminophen, diphenhydrAMINE **OR** diphenhydrAMINE, HYDROmorphone (DILAUDID) injection, HYDROmorphone, ipratropium-albuterol, LORazepam, naloxone **AND** sodium chloride flush, prochlorperazine  Physical Exam         There is  generalized weakness and he is deconditioned Diminished breath sounds Awake alert answers appropriately Trace lower extremity edema Has PEG tube and is on tube feeds S1-S2 PCA needs noted.   Vital Signs: BP 101/79 (BP Location: Right Arm)   Pulse (!) 127   Temp 98.3 F (36.8 C) (Oral)   Resp 19   Ht 5\' 11"  (1.803 m)   Wt 59 kg   SpO2 99%   BMI 18.14 kg/m  SpO2: SpO2: 99 % O2 Device: O2 Device: Nasal Cannula O2 Flow Rate: O2 Flow Rate (L/min): 3  L/min  Intake/output summary:  Intake/Output Summary (Last 24 hours) at 11/24/2022 1255 Last data filed at 11/24/2022 6578 Gross per 24 hour  Intake 1969.15 ml  Output 600 ml  Net 1369.15 ml    LBM: Last BM Date : 11/23/22 Baseline Weight: Weight: 63.5 kg Most recent weight: Weight: 59 kg       Palliative Assessment/Data:      Patient Active Problem List   Diagnosis Date Noted   HCAP (healthcare-associated pneumonia) 11/09/2022   Normocytic anemia 11/19/2022   Aspiration into lower respiratory tract  11/11/2022   PEG (percutaneous endoscopic gastrostomy) status (HCC) 11/08/2022   Prolonged QT interval 11/12/2022   Chronic respiratory failure with hypercapnia (HCC) 11/06/2022   Palliative care status 10/31/2022   Concern about end of life 10/30/2022   Muscular dystrophy (HCC) 10/27/2022   Chronic systolic heart failure (HCC) 10/27/2022   Dysphagia 10/27/2022   Lobar pneumonia, unspecified organism (HCC) 10/25/2022   Mixed hyperlipidemia 06/12/2022   Bulbar weakness (HCC) 11/09/2021   Erectile dysfunction 08/23/2021   Tobacco dependence due to cigarettes 06/22/2020   Family history of early CAD 06/22/2020   LVH (left ventricular hypertrophy) 05/26/2019   Routine adult health maintenance 12/26/2016   Sleep apnea 12/26/2016   NICM (nonischemic cardiomyopathy) (HCC) 10/03/2016   Essential hypertension 10/03/2016   Encounter for orogastric (OG) tube placement    Altered mental state 09/11/2016   Acute kidney injury (HCC) 09/11/2016   Acute respiratory failure (HCC) 09/11/2016   Lactic acidosis 09/11/2016   Smoker 09/11/2016   ETOH abuse 09/11/2016   Cocaine abuse (HCC) 09/11/2016   Nondependent alcohol abuse, in remission 09/11/2016    Palliative Care Assessment & Plan   Patient Profile:    Assessment: 47 year old gentleman with life limiting illness of muscular dystrophy with resultant chronic respiratory failure and recurrent aspiration, chronic systolic heart  failure dyslipidemia and anemia. Patient well-known to palliative service: extensive goals of care discussions were done the last time the patient was in the hospital admitted under teaching service, goals were revised to comfort-care, fentanyl PCA was started and the patient was sent to [facility].  However, patient's family was not in favor of this approach, patient was discharged from [facility] and taken to [facility] for second opinion.  Over there, the patient was placed on PEG tube and started on tube feeds and transition to home.  He is admitted again with possible bilateral aspiration pneumonia, healthcare respiratory pneumonia with CT chest showing complete right lower lobe consolidation, superimposed right middle lobe bronchopneumonia, increased left lower lobe opacity and emphysema.  He remains on broad-spectrum antibiotics and IV fluids.  Recommendations/Plan: Continue low dose Fentanyl PCA for pain management.  To remain on IV antibiotics for now, time-trial of current interventions continues, with pulmonary input also being sought on 11-23-22. Monitor hospital course.       Code Status:    Code Status Orders  (From admission, onward)           Start     Ordered   10/25/2022 0526  Do not attempt resuscitation (DNR)  Continuous       Question Answer Comment  If patient has no pulse and is not breathing Do Not Attempt Resuscitation   If patient has a pulse and/or is breathing: Medical Treatment Goals LIMITED ADDITIONAL INTERVENTIONS: Use medication/IV fluids and cardiac monitoring as indicated; Do not use intubation or mechanical ventilation (DNI), also provide comfort medications.  Transfer to Progressive/Stepdown as indicated, avoid Intensive Care.   Consent: Discussion documented in EHR or advanced directives reviewed      11/17/2022 0525           Code Status History     Date Active Date Inactive Code Status Order ID Comments User Context   10/26/2022 0934 11/01/2022 0442  DNR [id number]  Josephine Igo, DO Inpatient   10/25/2022 2257 10/26/2022 0934 Full Code [id number]  Tomma Lightning, MD ED   09/11/2016 1632 09/16/2016 2110 Full Code [id number]  Tobey Grim, NP ED       Prognosis:  Unable to determine  Discharge Planning: Home with Palliative Services  Care plan was discussed with  IDT  Thank you for allowing the Palliative Medicine Team to assist in the care of this patient. low MDM.      Greater than 50%  of this time was spent counseling and coordinating care related to the above assessment and plan.  Rosalin Hawking, MD  Please contact Palliative Medicine Team phone at [phone] for questions and concerns.

## 2022-11-24 NOTE — Progress Notes (Signed)
Nutrition Follow-up  DOCUMENTATION CODES:   Non-severe (moderate) malnutrition in context of chronic illness  INTERVENTION:  - Continue goal TF regimen: Osmolite 1.5 at 55 ml/h (1320 ml per day) Provides 1980 kcal, 83 gm protein, 1006 ml free water daily   - Monitor magnesium, potassium, and phosphorus BID for at least 3 days, MD to replete as needed, as pt is at risk for refeeding syndrome.   - Q4H free water flushes ( ) provides total of free water daily   - Monitor weight trends.   NUTRITION DIAGNOSIS:   Moderate Malnutrition related to chronic illness as evidenced by moderate fat depletion, severe muscle depletion. *ongoing  GOAL:   Patient will meet greater than or equal to 90% of their needs *new  MONITOR:   Diet advancement, Labs, Weight trends, TF tolerance  REASON FOR ASSESSMENT:   Consult Enteral/tube feeding initiation and management  ASSESSMENT:   47 y.o. male with PMH significant of HTN, muscular dystrophy, chronic respiratory failure with hypercapnia, CHF, HLD, normocytic anemia, alcohol abuse in remission, sleep apnea, history of AKI who was admitted on November 05, 2022 and discharged 2 days ago from [facility] due to pneumonia and had a PEG tube placed (6/18) due to aspiration. Presented to [facility] ED for SOB, admitted for HCAP.  Patient laying in bed at time of visit, TF infusing at goal of 46mL/hr.   Patient reports UBW of 150# and weight loss over the past couple of weeks. Per EMR, no weight history since 2021 until last month. Patient weighed at 140# on 6/6 and now weighed at 130#. This could represent a 10# or 7% weight loss in 3 weeks, which would be significant for the time frame. However, RD notes for [facility] admission indicate patient weighing 128# during that admission.   He endorses trouble swallowing for several months. Intake was very limited as a result of this. States he was on a diet for some time at [facility] but ate very little. PEG placed 6/18  and patient receiving TF since that time.   [facility] RD notes indicate patient initially on Jevity 1.5 but had diarrhea so was switched to Osmolite 1.5.  Patient shares his diarrhea remains ongoing this admission although not worse than it was before.  He is noted to be on several antibiotics and was receiving antibiotics at [facility] so question diarrhea could be a result of that. Will monitor bowel movements.  Otherwise, he endorses tolerating tube feeds with no other GI symptoms.  GOC discussions remain ongoing at this time.   Medications reviewed and include: Q4H FWF  Labs reviewed:  -   NUTRITION - FOCUSED PHYSICAL EXAM:  Flowsheet Row Most Recent Value  Orbital Region Moderate depletion  Upper Arm Region Severe depletion  Thoracic and Lumbar Region Moderate depletion  Buccal Region Moderate depletion  Temple Region Moderate depletion  Clavicle Bone Region Severe depletion  Clavicle and Acromion Bone Region Severe depletion  Scapular Bone Region Unable to assess  Dorsal Hand Moderate depletion  Patellar Region Moderate depletion  Anterior Thigh Region Moderate depletion  Posterior Calf Region Moderate depletion  Edema (RD Assessment) None  Hair Reviewed  Eyes Reviewed  Mouth Reviewed  Skin Reviewed  Nails Reviewed       Diet Order:   Diet Order             Diet NPO time specified Except for: Ice Chips  Diet effective now  EDUCATION NEEDS:  Education needs have been addressed  Skin:  Skin Assessment: Reviewed RN Assessment  Last BM:  6/30  Height:  Ht Readings from Last 1 Encounters:  11/22/22 5\' 11"  (1.803 m)   Weight:  Wt Readings from Last 1 Encounters:  11/23/22 59 kg   Ideal Body Weight:     BMI:  Body mass index is 18.14 kg/m.  Estimated Nutritional Needs:  Kcal:  1950-2100 kcals Protein:  80-100 grams Fluid:  >/= 1.9L    Shelle Iron RD, LDN For contact information, refer to [facility].

## 2022-11-24 NOTE — Progress Notes (Signed)
NIF -20 best of 3.

## 2022-11-24 NOTE — Progress Notes (Signed)
Pharmacy Antibiotic Note  Ronnie [name] is a 47 y.o. male admitted on 11/13/2022 with pneumonia.  Pharmacy has been consulted for Zosyn dosing.  ID: Possible bilateral aspiration PNA/HCAP - Afebrile, Scr <1,   Antimicrobials this admission: 6/28 vanc >> 6/28 cefepime >> 6/30 6/30 Zosyn>> 6/28 Flagyl >> 6/30   Dose adjustments this admission:    Microbiology results: 6/28 BCx2 ngtd 6/29 MRSA: neg  Plan: D/c Vancomycin Zosyn 3.375g IV q8hr for aspiration. Pharmacy will sign off. Please reconsult for further dosing assitance.     Height: 5\' 11"  (180.3 cm) Weight: 59 kg (130 lb 1.1 oz) IBW/kg (Calculated) : 75.3  Temp (24hrs), Avg:98.4 F (36.9 C), Min:98.1 F (36.7 C), Max:98.8 F (37.1 C)  Recent Labs  Lab 11/04/2022 0312 10/30/2022 0524 11/20/2022 2308 11/24/22 0350  WBC 11.5*  --   --   --   CREATININE 0.68  --   --  0.48*  LATICACIDVEN 1.1 0.9 1.3  --     Estimated Creatinine Clearance: 94.2 mL/min (A) (by C-G formula based on SCr of 0.48 mg/dL (L)).    Allergies  Allergen Reactions   Lisinopril Cough    Mivaan Corbitt S. Merilynn Finland, PharmD, BCPS Clinical Staff Pharmacist [email or web address]  Pasty Spillers 11/24/2022 10:59 AM

## 2022-11-24 NOTE — Care Management Important Message (Signed)
Important Message  Patient Details IM Letter given. Name: Baxley Hemp MRN: [id number] Date of Birth: 1975-12-29   Medicare Important Message Given:  Yes     Caren Macadam 11/24/2022, 3:03 PM

## 2022-11-24 NOTE — Progress Notes (Signed)
NAME:  Ronnie [name], MRN:  [id number], DOB:  07/31/1975, LOS: 3 ADMISSION DATE:  11/06/2022, CONSULTATION DATE: November 23, 2022 REFERRING MD:  Dr. 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  Status post recent PEG tube placement and [facility].  Has chronic systolic heart failure.  Other medical problems include hypertension, hyperlipidemia, normocytic anemia, tobacco dependence, history of cocaine abuse in remission, history of alcohol abuse in remission.  Was discharged on 10/31/2022 to residential hospice.  Was transferred from there to [facility]  Ended up with PEG tube placement on 11/11/2022.  Admitted now with elevated white count normal lactic acid normal troponins.  However has lower lobe collapse on the right lung with consolidation, small effusion concerning for progressive pneumonia.  Patient was started on IV antibiotics.  Pulmonary critical care was consulted for recommendations.  Pertinent  Medical History   Past Medical History:  Diagnosis Date   Hypertension    Muscular dystrophy (HCC)    Substance abuse (HCC)      Significant Hospital Events: Including procedures, antibiotic start and stop dates in addition to other pertinent events     Interim History / Subjective:  Seen with family at bedside  Per HPI above  Objective   Blood pressure 105/85, pulse (!) 114, temperature 98.6 F (37 C), temperature source Oral, resp. rate (!) 22, height 5\' 11"  (1.803 m), weight 59 kg, SpO2 97 %.    FiO2 (%):  [0 %-32 %] 32 %   Intake/Output Summary (Last 24 hours) at 11/24/2022 1003 Last data filed at 11/24/2022 4098 Gross per 24 hour  Intake 1969.15 ml  Output 850 ml  Net 1119.15 ml    Filed Weights   10/28/2022 0500 11/22/22 0409 11/23/22 0645  Weight: 63.5 kg  58.7 kg 59 kg    Examination: General: Frail middle-aged man in no acute distress HENT: Raspy voice, dry mucous membranes, temporal wasting Lungs: Scattered rhonchi and transmitted upper airway sounds, no accessory muscle use, shallow inspirations Cardiovascular: Heart sounds regular, extremities warm Abdomen: Soft, PEG in place Extremities: No edema, positive muscle wasting Neuro: Very weak proximal more than distal Skin no rashes  Resolved Hospital Problem list     Assessment & Plan:   Bilateral aspiration pneumonia, healthcare associated, present on admission Acute on chronic hypoxemic hypercarbic respiratory failure secondary to neuromuscular disease, progressive neuromuscular dystrophy Status post PEG tube placement Chronic systolic heart failure, appears euvolemic Leukocytosis secondary to above Acute on chronic anemia of chronic disease Mixed hyperlipidemia Chronic tobacco abuse, stopped smoking approximately 1 month ago. Severe protein calorie malnutrition, BMI 18, present on admission Plan: Abx per primary currently Zosyn plus vancomycin, plan for minimum 7 day course, recommend d/c vancomycin given negative MRSA swab Chest PT, CoughAssist to help mobilize secretions  PCCM will sign off.   Labs   CBC: Recent Labs  Lab 11/05/2022 0312  WBC 11.5*  NEUTROABS 7.2  HGB 11.8*  HCT 38.5*  MCV 94.1  PLT 237     Basic Metabolic Panel: Recent Labs  Lab 11/13/2022 0312 11/13/2022 0524 11/18/2022 1645 11/22/22 0512 11/22/22 1637 11/23/22 0507 11/24/22 0350  NA 140  --   --   --   --   --   --   K 3.9  --   --   --   --   --   --  CL 103  --   --   --   --   --   --   CO2 27  --   --   --   --   --   --   GLUCOSE 113*  --   --   --   --   --   --   BUN 16  --   --   --   --   --   --   CREATININE 0.68  --   --   --   --   --  0.48*  CALCIUM 9.9  --   --   --   --   --   --   MG  --  1.8 2.2 2.2 2.0 2.3  --   PHOS  --  3.5 4.1 4.3 4.5 4.4  --      GFR: Estimated Creatinine Clearance: 94.2 mL/min (A) (by C-G formula based on SCr of 0.48 mg/dL (L)). Recent Labs  Lab 11/14/2022 0312 11/07/2022 0524 11/07/2022 2308  WBC 11.5*  --   --   LATICACIDVEN 1.1 0.9 1.3     Liver Function Tests: Recent Labs  Lab 10/28/2022 0312  AST 24  ALT 21  ALKPHOS 76  BILITOT 0.5  PROT 9.4*  ALBUMIN 3.6    No results for input(s): "LIPASE", "AMYLASE" in the last 168 hours. No results for input(s): "AMMONIA" in the last 168 hours.  ABG    Component Value Date/Time   PHART 7.280 (L) 09/12/2016 0341   PCO2ART 50.5 (H) 09/12/2016 0341   PO2ART 85.6 09/12/2016 0341   HCO3 27.4 10/25/2022 2011   TCO2 28 10/25/2022 2012   ACIDBASEDEF 2.7 (H) 09/12/2016 0341   O2SAT 43 10/25/2022 2011     Coagulation Profile: No results for input(s): "INR", "PROTIME" in the last 168 hours.  Cardiac Enzymes: No results for input(s): "CKTOTAL", "CKMB", "CKMBINDEX", "TROPONINI" in the last 168 hours.  HbA1C: No results found for: "HGBA1C"  CBG: Recent Labs  Lab 11/23/22 1730 11/23/22 2036 11/24/22 0052 11/24/22 0320 11/24/22 0742  GLUCAP 123* 141* 123* 117* 133*     Review of Systems:    Positive Symptoms in bold:  Constitutional fevers, chills, weight loss, fatigue, anorexia, malaise  Eyes decreased vision, double vision, eye irritation  Ears, Nose, Mouth, Throat sore throat, trouble swallowing, sinus congestion  Cardiovascular chest pain, paroxysmal nocturnal dyspnea, lower ext edema, palpitations   Respiratory SOB, cough, DOE, hemoptysis, wheezing  Gastrointestinal nausea, vomiting, diarrhea  Genitourinary burning with urination, trouble urinating  Musculoskeletal joint aches, joint swelling, back pain  Integumentary  rashes, skin lesions  Neurological focal weakness, focal numbness, trouble speaking, headaches  Psychiatric depression, anxiety, confusion  Endocrine polyuria, polydipsia, cold intolerance, heat intolerance  Hematologic  abnormal bruising, abnormal bleeding, unexplained nose bleeds  Allergic/Immunologic recurrent infections, hives, swollen lymph nodes     Past Medical History:  He,  has a past medical history of Hypertension, Muscular dystrophy (HCC), and Substance abuse (HCC).   Surgical History:   Past Surgical History:  Procedure Laterality Date   NO PAST SURGERIES     OTHER SURGICAL HISTORY     No surgical history      Social History:   reports that he has been smoking cigarettes. He has a 12.50 pack-year smoking history. He has never used smokeless tobacco. He reports current alcohol use of about 6.0 standard drinks of alcohol per week. He reports that he does not currently use drugs  after having used the following drugs: Cocaine.   Family History:  His family history includes Heart disease in his maternal grandmother and mother.   Allergies Allergies  Allergen Reactions   Lisinopril Cough     Home Medications  Prior to Admission medications   Medication Sig Start Date End Date Taking? Authorizing Provider  HYDROmorphone (DILAUDID) 8 MG tablet Take 8 mg by mouth every 4 (four) hours as needed for moderate pain. 11/19/22  Yes [provider]  fentaNYL (SUBLIMAZE) SOLN Inject 50-100 mcg into the vein every 30 (thirty) minutes as needed (sob). Patient not taking: Reported on 11/03/2022 10/31/22   Shelby Mattocks, DO  fentaNYL 10 mcg/ml SOLN infusion Inject 100-200 mcg/hr into the vein continuous. Patient not taking: Reported on 11/10/2022 10/31/22   Shelby Mattocks, DO  glycopyrrolate (ROBINUL) 0.2 MG/ML injection Inject 2 mLs (0.4 mg total) into the vein 4 (four) times daily. Patient not taking: Reported on 11/07/2022 10/31/22   Shelby Mattocks, DO  ipratropium-albuterol (DUONEB) 0.5-2.5 (3) MG/3ML SOLN Take 3 mLs by nebulization every 8 (eight) hours as needed. 10/31/22   Shelby Mattocks, DO  lidocaine (LIDODERM) 5 % Place 1 patch onto the skin daily. Remove & Discard patch within 12 hours or as  directed by MD Patient not taking: Reported on 11/15/2022 10/31/22   Shelby Mattocks, DO  LORazepam (ATIVAN) 0.5 MG tablet Take 1 tablet (0.5 mg total) by mouth every 4 (four) hours as needed for anxiety. Patient not taking: Reported on 11/03/2022 10/31/22   Shelby Mattocks, DO  LORazepam (ATIVAN) 2 MG/ML injection Inject 0.25 mLs (0.5 mg total) into the vein every 4 (four) hours as needed for anxiety. Patient not taking: Reported on 11/01/2022 10/31/22   Shelby Mattocks, DO  metoprolol tartrate (LOPRESSOR) 5 MG/5ML SOLN injection Inject 2.5 mLs (2.5 mg total) into the vein every 12 (twelve) hours. Patient not taking: Reported on 11/15/2022 10/31/22   Shelby Mattocks, DO  polyethylene glycol (MIRALAX / GLYCOLAX) 17 g packet Take 17 g by mouth daily as needed for moderate constipation. Patient not taking: Reported on 11/09/2022 10/31/22   Shelby Mattocks, DO     Critical care time: N/A    Karren Burly, MD See Loretha Stapler

## 2022-11-24 DEATH — deceased

## 2022-11-25 DIAGNOSIS — J9612 Chronic respiratory failure with hypercapnia: Secondary | ICD-10-CM

## 2022-11-25 DIAGNOSIS — G7100 Muscular dystrophy, unspecified: Secondary | ICD-10-CM | POA: Diagnosis not present

## 2022-11-25 DIAGNOSIS — J690 Pneumonitis due to inhalation of food and vomit: Principal | ICD-10-CM

## 2022-11-25 DIAGNOSIS — Z79899 Other long term (current) drug therapy: Secondary | ICD-10-CM

## 2022-11-25 DIAGNOSIS — Z515 Encounter for palliative care: Secondary | ICD-10-CM

## 2022-11-25 DIAGNOSIS — E440 Moderate protein-calorie malnutrition: Secondary | ICD-10-CM | POA: Insufficient documentation

## 2022-11-25 DIAGNOSIS — Z7189 Other specified counseling: Secondary | ICD-10-CM

## 2022-11-25 DIAGNOSIS — Z931 Gastrostomy status: Secondary | ICD-10-CM

## 2022-11-25 DIAGNOSIS — J189 Pneumonia, unspecified organism: Secondary | ICD-10-CM | POA: Diagnosis not present

## 2022-11-25 DIAGNOSIS — I5022 Chronic systolic (congestive) heart failure: Secondary | ICD-10-CM | POA: Diagnosis not present

## 2022-11-25 LAB — CBC WITH DIFFERENTIAL/PLATELET
Abs Immature Granulocytes: 0.05 10*3/uL (ref 0.00–0.07)
Basophils Absolute: 0 10*3/uL (ref 0.0–0.1)
Basophils Relative: 0 %
Eosinophils Absolute: 0.2 10*3/uL (ref 0.0–0.5)
Eosinophils Relative: 1 %
HCT: 36.5 % — ABNORMAL LOW (ref 39.0–52.0)
Hemoglobin: 10.5 g/dL — ABNORMAL LOW (ref 13.0–17.0)
Immature Granulocytes: 0 %
Lymphocytes Relative: 14 %
Lymphs Abs: 1.8 10*3/uL (ref 0.7–4.0)
MCH: 29.2 pg (ref 26.0–34.0)
MCHC: 28.8 g/dL — ABNORMAL LOW (ref 30.0–36.0)
MCV: 101.7 fL — ABNORMAL HIGH (ref 80.0–100.0)
Monocytes Absolute: 0.6 10*3/uL (ref 0.1–1.0)
Monocytes Relative: 5 %
Neutro Abs: 10.4 10*3/uL — ABNORMAL HIGH (ref 1.7–7.7)
Neutrophils Relative %: 80 %
Platelets: 157 10*3/uL (ref 150–400)
RBC: 3.59 MIL/uL — ABNORMAL LOW (ref 4.22–5.81)
RDW: 15.9 % — ABNORMAL HIGH (ref 11.5–15.5)
WBC: 13 10*3/uL — ABNORMAL HIGH (ref 4.0–10.5)
nRBC: 0 % (ref 0.0–0.2)

## 2022-11-25 LAB — COMPREHENSIVE METABOLIC PANEL
ALT: 14 U/L (ref 0–44)
AST: 15 U/L (ref 15–41)
Albumin: 2.9 g/dL — ABNORMAL LOW (ref 3.5–5.0)
Alkaline Phosphatase: 70 U/L (ref 38–126)
Anion gap: 9 (ref 5–15)
BUN: 15 mg/dL (ref 6–20)
CO2: 24 mmol/L (ref 22–32)
Calcium: 8.8 mg/dL — ABNORMAL LOW (ref 8.9–10.3)
Chloride: 104 mmol/L (ref 98–111)
Creatinine, Ser: 0.54 mg/dL — ABNORMAL LOW (ref 0.61–1.24)
GFR, Estimated: >60 mL/min (ref >60)
Glucose, Bld: 154 mg/dL — ABNORMAL HIGH (ref 70–99)
Potassium: 4.7 mmol/L (ref 3.5–5.1)
Sodium: 137 mmol/L (ref 135–145)
Total Bilirubin: 0.3 mg/dL (ref 0.3–1.2)
Total Protein: 7.4 g/dL (ref 6.5–8.1)

## 2022-11-25 LAB — GLUCOSE, CAPILLARY
Glucose-Capillary: 116 mg/dL — ABNORMAL HIGH (ref 70–99)
Glucose-Capillary: 124 mg/dL — ABNORMAL HIGH (ref 70–99)
Glucose-Capillary: 128 mg/dL — ABNORMAL HIGH (ref 70–99)
Glucose-Capillary: 140 mg/dL — ABNORMAL HIGH (ref 70–99)
Glucose-Capillary: 145 mg/dL — ABNORMAL HIGH (ref 70–99)
Glucose-Capillary: 147 mg/dL — ABNORMAL HIGH (ref 70–99)
Glucose-Capillary: 167 mg/dL — ABNORMAL HIGH (ref 70–99)

## 2022-11-25 LAB — MAGNESIUM: Magnesium: 2.1 mg/dL (ref 1.7–2.4)

## 2022-11-25 MED ORDER — METHADONE HCL 10 MG/ML PO CONC
10.0000 mg | Freq: Three times a day (TID) | ORAL | Status: DC
  Administered 2022-11-25 – 2022-11-27 (×6): 10 mg
  Filled 2022-11-25 (×6): qty 5

## 2022-11-25 MED ORDER — HYDROMORPHONE HCL 1 MG/ML PO LIQD: 2.0000 mg | ORAL | Status: DC | PRN

## 2022-11-25 MED ORDER — MAGNESIUM OXIDE -MG SUPPLEMENT 400 (240 MG) MG PO TABS
400.0000 mg | ORAL_TABLET | Freq: Two times a day (BID) | ORAL | Status: DC
  Administered 2022-11-25 – 2022-11-26 (×4): 400 mg via ORAL
  Filled 2022-11-25 (×4): qty 1

## 2022-11-25 MED ORDER — HYDROMORPHONE HCL 2 MG PO TABS
4.0000 mg | ORAL_TABLET | ORAL | Status: DC | PRN
  Administered 2022-11-25 – 2022-11-26 (×3): 4 mg
  Filled 2022-11-25 (×3): qty 2

## 2022-11-25 MED ORDER — HYDROMORPHONE HCL 1 MG/ML IJ SOLN
1.0000 mg | INTRAMUSCULAR | Status: DC | PRN
  Administered 2022-11-25 – 2022-11-27 (×4): 2 mg via INTRAVENOUS
  Filled 2022-11-25 (×5): qty 2

## 2022-11-25 NOTE — Progress Notes (Signed)
MD informed that Pt is  breathing shallow and saying he is in pain. His vitals with the tachycardia and shallow high respirations are making him a red MEWS. MD states taking into pt condition ,no  new orders? Do not  activate Red MEWs, just continue rountine vitals and he will touch bases with Palliative cases to manage pain and comfort including touch bases with family to see if any decision has been made regarding hospice. Will continue to monitor.

## 2022-11-25 NOTE — Progress Notes (Signed)
Patients NIF -20 with good effort.  3 attempts.

## 2022-11-25 NOTE — Plan of Care (Signed)

## 2022-11-25 NOTE — Progress Notes (Signed)
PROGRESS NOTE    Ronnie [name]  XLK:[id number] DOB: 03/22/1976 DOA: 11/20/2022 PCP: Claiborne Rigg, NP   Brief Narrative:  47 y.o. male with medical history significant of hypertension, muscular dystrophy, chronic respiratory failure with hypercapnia, chronic systolic heart failure, hyperlipidemia, normocytic anemia, tobacco dependence in remission, cocaine abuse in remission, alcohol abuse in remission, sleep apnea with recent admission at [facility] health and discharged on 10/31/2022 to residential hospice.  Unclear what happened but patient apparently ended up at [facility], had a PEG tube placed on 11/11/2022 and was discharged 2 days prior to presentation presented with worsening shortness of breath.  On presentation, white count was 11.5, lactic acid and troponins were normal. Portable 1 view chest radiograph show increased confluence of the right lower lobe collapse or consolidation and small right pleural effusion since 10/29/2022. CTA chest was negative for PE; complete right lower lobe consolidation, progressed since CT earlier this month; superimposed right middle lobe bronchopneumonia; minimal right pleural effusion; increased left lower lobe opacity; emphysema.  He was started on broad-spectrum antibiotics and IV fluids.  Palliative care and subsequently pulmonary were consulted.  Assessment & Plan:   Possible bilateral aspiration pneumonia/healthcare respiratory pneumonia Chronic respiratory failure with hypercapnia and hypoxia Progressive muscular dystrophy Goals of care -Imaging as above: Showing worsening consolidation.  Currently on broad-spectrum antibiotics.  Patient will probably continue to aspirate.  Keep him strictly n.p.o. -He was discharged on 10/31/2022 from [facility] health to residential hospice.  Family rescinded hospice and subsequently took the patient to [facility] for second opinion; had a PEG tube placed on 11/11/2022 and was discharged 2 days prior to presentation and presented with  worsening shortness of breath.   -Palliative care and hospice team following: Patient initially expressed that he wanted to go to residential hospice but then subsequently after discussion with family, patient/family wanted to go home with appropriate pain medications but not with hospice services.  Currently on fentanyl PCA as per palliative care.  Also on as needed Dilaudid.  I had a long conversation with patient/wife at bedside and sister on phone on 11/23/2022: Patient/family hopeful that he will have some recovery of pneumonia with treatment and they told me that the antibiotics at [facility] had worked and [employer] had told him that the pneumonia was gone and he was on the road to recovery.  They are still hopeful for him to get slightly better and go home.  They were wondering if patient could be evaluated by SLP and PT.  I had explained to them that he will probably keep aspirating his own saliva, gastric juices and tube feeding.  They had questions regarding if anything else could be done for the pneumonia.  I subsequently changed his antibiotics to Zosyn and they were agreeable.  They also agreed for pulmonary evaluation.   -Overall prognosis is very poor. -Pulmonary follow-up appreciated.  Pulmonary signed off on 11/24/2022 and recommended to continue Zosyn for minimum 7-day course.  Recommended chest PT, CoughAssist to help mobilize secretions. -SLP recommended n.p.o. with ice chips as needed after oral care.  PT recommended home health PT.  Status post PEG tube placement -He is still aspirating.  He will probably keep aspirating if we continue PEG tube feeding but he wants to continue PEG feeding for now.  Discussion as above.  Chronic systolic heart failure -Not volume overloaded.  Strict input output.  Daily weights.  Was given gentle hydration on presentation.  Leukocytosis -Monitor.  Anemia of chronic disease Macrocytosis -From chronic illnesses.  Hemoglobin stable.  Monitor  intermittently  Mixed hyperlipidemia -Not on any medical therapy.  Outpatient follow-up with PCP  Chronic tobacco use -Apparently stopped smoking a month ago  DVT prophylaxis: SCDs Code Status: DNR.  Patient apparently rescinded hospice status recently Family Communication: Wife at bedside and sister on phone on 11/23/2022 Disposition Plan: Status is: Inpatient Remains inpatient appropriate because: Of severity of illness.     Consultants: Palliative care/hospice team/PCCM  Procedures: None  Antimicrobials:  Anti-infectives (From admission, onward)    Start     Dose/Rate Route Frequency Ordered Stop   11/23/22 1800  piperacillin-tazobactam (ZOSYN) IVPB 3.375 g        3.375 g 12.5 mL/hr over 240 Minutes Intravenous Every 8 hours 11/23/22 1616     11/13/2022 1800  vancomycin (VANCOCIN) IVPB 1000 mg/200 mL premix  Status:  Discontinued        1,000 mg 200 mL/hr over 60 Minutes Intravenous Every 12 hours 11/12/2022 0513 11/24/22 1058   11/10/2022 1800  metroNIDAZOLE (FLAGYL) IVPB 500 mg  Status:  Discontinued        500 mg 100 mL/hr over 60 Minutes Intravenous Every 12 hours 11/23/2022 0846 11/23/22 1605   10/29/2022 1300  ceFEPIme (MAXIPIME) 2 g in sodium chloride 0.9 % 100 mL IVPB  Status:  Discontinued       Note to Pharmacy: Please adjust dose as needed. TY.   2 g 200 mL/hr over 30 Minutes Intravenous Every 8 hours 10/25/2022 0846 11/23/22 1605   10/29/2022 0515  vancomycin (VANCOCIN) IVPB 1000 mg/200 mL premix        1,000 mg 200 mL/hr over 60 Minutes Intravenous  Once 11/03/2022 0509 11/07/2022 0751   11/15/2022 0500  ceFEPIme (MAXIPIME) 2 g in sodium chloride 0.9 % 100 mL IVPB        2 g 200 mL/hr over 30 Minutes Intravenous  Once 11/13/2022 0450 10/28/2022 0609   11/07/2022 0500  metroNIDAZOLE (FLAGYL) IVPB 500 mg        500 mg 100 mL/hr over 60 Minutes Intravenous  Once 10/30/2022 0450 11/13/2022 0751        Subjective: Patient seen and examined at bedside.  Still complains of intermittent  pain requiring fentanyl PCA along with IV Dilaudid intermittently.  No fever, seizures or agitation reported.  Objective: Vitals:   11/24/22 2012 11/25/22 0015 11/25/22 0431 11/25/22 0512  BP: 103/84 96/71 100/86   Pulse: (!) 121 (!) 117 (!) 111   Resp: 19 17 16    Temp: 98.4 F (36.9 C) 98 F (36.7 C) 98.4 F (36.9 C)   TempSrc: Oral Oral Oral   SpO2: 100% 100% 100%   Weight:    65.6 kg  Height:        Intake/Output Summary (Last 24 hours) at 11/25/2022 0820 Last data filed at 11/25/2022 0700 Gross per 24 hour  Intake 1403.78 ml  Output 1000 ml  Net 403.78 ml    Filed Weights   11/23/22 0645 11/24/22 1000 11/25/22 0512  Weight: 59 kg 64.8 kg 65.6 kg    Examination:  General: No acute distress.  Still on 3 L oxygen by nasal cannula.  Looks chronically ill and deconditioned.  Extremely thinly built. ENT/neck: No obvious JVD elevation or palpable neck masses noted  respiratory: Bilateral decreased breath sounds at bases with some crackles  CVS: Tachycardic; S1 and S2 heard  abdominal: Soft, nontender, distended slightly; no organomegaly, normal bowel sounds are heard.  PEG tube present. Extremities: No clubbing; trace  lower extremity edema present CNS: Alert; still slow to respond.  Answers some questions.  Poor historian.  No obvious focal deficits noted.   Lymph: No palpable lymphadenopathy  skin: No rash/petechia  psych: Currently not agitated.  Flat affect  musculoskeletal: No obvious joint tenderness/erythema    Data Reviewed: I have personally reviewed following labs and imaging studies  CBC: Recent Labs  Lab 11/11/2022 0312 11/25/22 0450  WBC 11.5* 13.0*  NEUTROABS 7.2 10.4*  HGB 11.8* 10.5*  HCT 38.5* 36.5*  MCV 94.1 101.7*  PLT 237 157    Basic Metabolic Panel: Recent Labs  Lab 11/13/2022 0312 11/06/2022 0524 10/29/2022 0524 11/01/2022 1645 11/22/22 0512 11/22/22 1637 11/23/22 0507 11/24/22 0350 11/25/22 0450  NA 140  --   --   --   --   --   --   --   137  K 3.9  --   --   --   --   --   --   --  4.7  CL 103  --   --   --   --   --   --   --  104  CO2 27  --   --   --   --   --   --   --  24  GLUCOSE 113*  --   --   --   --   --   --   --  154*  BUN 16  --   --   --   --   --   --   --  15  CREATININE 0.68  --   --   --   --   --   --  0.48* 0.54*  CALCIUM 9.9  --   --   --   --   --   --   --  8.8*  MG  --  1.8   < > 2.2 2.2 2.0 2.3  --  2.1  PHOS  --  3.5  --  4.1 4.3 4.5 4.4  --   --    < > = values in this interval not displayed.    GFR: Estimated Creatinine Clearance: 104.8 mL/min (A) (by C-G formula based on SCr of 0.54 mg/dL (L)). Liver Function Tests: Recent Labs  Lab 11/23/2022 0312 11/25/22 0450  AST 24 15  ALT 21 14  ALKPHOS 76 70  BILITOT 0.5 0.3  PROT 9.4* 7.4  ALBUMIN 3.6 2.9*    No results for input(s): "LIPASE", "AMYLASE" in the last 168 hours. No results for input(s): "AMMONIA" in the last 168 hours. Coagulation Profile: No results for input(s): "INR", "PROTIME" in the last 168 hours. Cardiac Enzymes: No results for input(s): "CKTOTAL", "CKMB", "CKMBINDEX", "TROPONINI" in the last 168 hours. BNP (last 3 results) No results for input(s): "PROBNP" in the last 8760 hours. HbA1C: No results for input(s): "HGBA1C" in the last 72 hours. CBG: Recent Labs  Lab 11/24/22 1720 11/24/22 2009 11/25/22 0010 11/25/22 0429 11/25/22 0757  GLUCAP 138* 131* 124* 128* 147*    Lipid Profile: No results for input(s): "CHOL", "HDL", "LDLCALC", "TRIG", "CHOLHDL", "LDLDIRECT" in the last 72 hours. Thyroid Function Tests: No results for input(s): "TSH", "T4TOTAL", "FREET4", "T3FREE", "THYROIDAB" in the last 72 hours. Anemia Panel: No results for input(s): "VITAMINB12", "FOLATE", "FERRITIN", "TIBC", "IRON", "RETICCTPCT" in the last 72 hours. Sepsis Labs: Recent Labs  Lab 11/02/2022 0312 11/17/2022 0524 11/03/2022 2308  LATICACIDVEN 1.1 0.9 1.3     Recent  Results (from the past 240 hour(s))  Blood culture (routine x  2)     Status: None (Preliminary result)   Collection Time: 11/22/2022  3:12 AM   Specimen: BLOOD  Result Value Ref Range Status   Specimen Description   Final    BLOOD RIGHT ANTECUBITAL Performed at [facility], [address] [place], [place] [address]    Special Requests   Final    BOTTLES DRAWN AEROBIC AND ANAEROBIC Blood Culture adequate volume Performed at [facility], [address], [place], [place] [address]    Culture   Final    NO GROWTH 3 DAYS Performed at [facility], [address], [place], [place] [address]    Report Status PENDING  Incomplete  Blood culture (routine x 2)     Status: None (Preliminary result)   Collection Time: 11/17/2022  3:40 AM   Specimen: BLOOD  Result Value Ref Range Status   Specimen Description   Final    BLOOD BLOOD LEFT FOREARM Performed at [facility], [address] [place], [place] [address]    Special Requests   Final    BOTTLES DRAWN AEROBIC AND ANAEROBIC Blood Culture adequate volume Performed at [facility], [address], [place], [place] [address]    Culture   Final    NO GROWTH 3 DAYS Performed at [facility], [address], [place], [place] [address]    Report Status PENDING  Incomplete  MRSA Next Gen by PCR, Nasal     Status: None   Collection Time: 11/22/22 12:49 PM   Specimen: Nasal Mucosa; Nasal Swab  Result Value Ref Range Status   MRSA by PCR Next Gen NOT DETECTED NOT DETECTED Final    Comment: (NOTE) The GeneXpert MRSA Assay (FDA approved for NASAL specimens only), is one component of a comprehensive MRSA colonization surveillance program. It is not intended to diagnose MRSA infection nor to guide or monitor treatment for MRSA infections. Test performance is not FDA approved in patients less than 5 years old. Performed at [facility], [address], [place], [place] [address]           Radiology Studies: No results found.      Scheduled Meds:  fentaNYL   Intravenous Q4H   free water  100 mL Per Tube Q4H    HYDROmorphone (DILAUDID) injection  1 mg Intravenous Once   ipratropium-albuterol  3 mL Nebulization BID   sodium chloride HYPERTONIC  4 mL Nebulization BID   Continuous Infusions:  feeding supplement (OSMOLITE 1.5 CAL) 1,000 mL (11/25/22 0253)   piperacillin-tazobactam (ZOSYN)  IV 3.375 g (11/25/22 0559)          Glade Lloyd, MD [facility] 11/25/2022, 8:20 AM

## 2022-11-25 NOTE — Progress Notes (Signed)
Pt performed NIF x 3 attempts with a -25.

## 2022-11-25 NOTE — Progress Notes (Signed)
Hold NIF and flutter valve for now until pt's pain is under control.  Will reassess later this am.

## 2022-11-25 NOTE — Progress Notes (Signed)
Daily Progress Note   Patient Name: Ronnie [name]       Date: 11/25/2022 DOB: 11/03/1975  Age: 47 y.o. MRN#: [id number] Attending Physician: Glade Lloyd, MD Primary Care Physician: Claiborne Rigg, NP Admit Date: 11/03/2022 Length of Stay: 4 days  Reason for Consultation/Follow-up: Establishing goals of care and management  Subjective:   CC: Patient notes pain has been improved on fentanyl PCA.  Working to get patient home with palliative medicine support so we will transition to medications can continue at home.  Following up regarding complex medical decision making and symptom management.  Subjective:  Extensive review of EMR prior to presenting to bedside.  Also discussed care with PCCM provider as well as primary hospitalist and bedside RN. Per EMR review in past 24 hours patient has received IV fentanyl 12.5 mcg bolus x 47 doses.  Patient has also received additional 13 mg of IV Dilaudid.  Presented to bedside to meet with patient.  Introduced myself as a member of the palliative medicine team.  Patient feels his pain has been improved on current fentanyl PCA.  Patient is hoping to return home with palliative medicine support. Has already discussed with [facility] representative regarding this.  Discussed need to transition off of fentanyl PCA and onto regimen he can continue at home since not going home with hospice.  Based on patient's OME needs and n.p.o. status.  Discussed transitioning to methadone at this time for long-acting pain regimen management.  Patient is incredibly thin so we will has been previous concern from palliative providers that patient would not fully absorb fentanyl patch no hesitant to start that.  Discussed risk and benefits of methadone with patient.  Discussed would be every 8 hours during the day via PEG tube.  Noted would also start on magnesium supplement due to patient's prior EKG showing QTc of 486 on 11/18/2022.  After reviewing medication, patient agreeing to  transition to methadone via PEG tube at this time.  Also discussed with transition to Dilaudid tablets via PEG tube instead of IV Dilaudid to allow for breakthrough management.  Patient agreeing with this transition as well.  Spent time answering questions as able regarding plan of care moving forward.  Noted palliative medicine team would continue to follow with patient's medical journey.  Updated care team regarding discussion with patient.  Review of Systems Pain improved Objective:   Vital Signs:  BP 118/85 (BP Location: Right Arm)   Pulse (!) 140   Temp 98.3 F (36.8 C) (Oral)   Resp (!) 30   Ht 5\' 11"  (1.803 m)   Wt 65.6 kg   SpO2 100%   BMI 20.17 kg/m   Physical Exam: General: NAD, alert, chronically ill appearing, frail Eyes: No drainage noted HENT: Dry mucous membranes Cardiovascular: Tachycardia noted Respiratory: increased work of breathing noted, not in respiratory distress Extremities: Muscle wasting present in all extremities Neuro: A&Ox4, following commands easily Psych: appropriately answers all questions  Imaging:  I personally reviewed recent imaging.   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 Patient well-known to palliative service: extensive goals of care discussions were done the last time the patient was in the hospital admitted under teaching service, goals were revised to comfort-care, fentanyl PCA was started and the patient was sent [facility].  However, patient's family was not in favor of this  approach, patient was discharged [facility] and taken to [facility] for second opinion.  Over there, the patient was placed on PEG tube and started on tube feeds and transition to home. He is admitted again with possible bilateral aspiration pneumonia,  healthcare respiratory pneumonia with CT chest showing complete right lower lobe consolidation, superimposed right middle lobe bronchopneumonia, increased left lower lobe opacity and emphysema.  He remains on broad-spectrum antibiotics and IV fluids.  Recommendations/Plan: # Complex medical decision making/goals of care:  -Patient electing to return home with palliative medicine services (NOT hospice) once appropriate medically for discharge.   -  Code Status: DNR  # Symptom management:  -Pain/Dyspnea, in the setting of muscular dystrophy   -Stop IV fentanyl PCA 12.75mcg q67mins tonight at 2200.    -Start Methadone 10mg  q8hours scheduled via PEG during the day at 1400 today (dose based on OME requirement for past 24 hours including reducing for incomplete cross tolerance).     -QTc noted to be 486 on 6/28 EKG. Will start on BID mag oxide supplement as well. Reviewed risks and benefits of medication with patient who agreed with transition to methadone as long acting opioid medication at this time. Hesitant to use fentanyl patch due to patient's lack of fat which could affect absorption. Recheck EKG on 7/4. Please avoid medication that inhibit methadone clearance and minimize other QTc prolonging medications.    - Start dilaudid tablet 4mg  q4hrs prn via PEG for short acting opioid management.    - Change IV dilaudid 1-2mg  q2hrs prn for breakthrough after pill medication.   # Psychosocial Support:  -wife, sister  # Discharge Planning: Home with Palliative Services  Discussed with: patient, bedside RN, hospitalist, PCCM provider   Thank you for allowing the palliative care team to participate in the care Ronnie [name].  Alvester Morin, DO Palliative Care Provider PMT # [phone]  If patient remains symptomatic despite maximum doses, please call PMT at [phone] between 0700 and 1900. Outside of these hours, please call attending, as PMT does not have night coverage.  *Please note that  this is a verbal dictation therefore any spelling or grammatical errors are due to the "Dragon Medical One" system interpretation.

## 2022-11-26 DIAGNOSIS — R52 Pain, unspecified: Secondary | ICD-10-CM

## 2022-11-26 DIAGNOSIS — J690 Pneumonitis due to inhalation of food and vomit: Secondary | ICD-10-CM | POA: Diagnosis not present

## 2022-11-26 DIAGNOSIS — J9612 Chronic respiratory failure with hypercapnia: Secondary | ICD-10-CM | POA: Diagnosis not present

## 2022-11-26 DIAGNOSIS — Z515 Encounter for palliative care: Secondary | ICD-10-CM | POA: Diagnosis not present

## 2022-11-26 DIAGNOSIS — E440 Moderate protein-calorie malnutrition: Secondary | ICD-10-CM

## 2022-11-26 DIAGNOSIS — Z7189 Other specified counseling: Secondary | ICD-10-CM | POA: Diagnosis not present

## 2022-11-26 DIAGNOSIS — Z79899 Other long term (current) drug therapy: Secondary | ICD-10-CM

## 2022-11-26 DIAGNOSIS — J189 Pneumonia, unspecified organism: Secondary | ICD-10-CM | POA: Diagnosis not present

## 2022-11-26 LAB — CBC WITH DIFFERENTIAL/PLATELET
Abs Immature Granulocytes: 0.03 10*3/uL (ref 0.00–0.07)
Abs Immature Granulocytes: 0.04 10*3/uL (ref 0.00–0.07)
Basophils Absolute: 0 10*3/uL (ref 0.0–0.1)
Basophils Absolute: 0 10*3/uL (ref 0.0–0.1)
Basophils Relative: 0 %
Basophils Relative: 0 %
Eosinophils Absolute: 0.2 10*3/uL (ref 0.0–0.5)
Eosinophils Absolute: 0.1 10*3/uL (ref 0.0–0.5)
Eosinophils Relative: 2 %
Eosinophils Relative: 1 %
HCT: 36.3 % — ABNORMAL LOW (ref 39.0–52.0)
HCT: 32.6 % — ABNORMAL LOW (ref 39.0–52.0)
Hemoglobin: 10.7 g/dL — ABNORMAL LOW (ref 13.0–17.0)
Hemoglobin: 9.6 g/dL — ABNORMAL LOW (ref 13.0–17.0)
Immature Granulocytes: 0 %
Immature Granulocytes: 0 %
Lymphocytes Relative: 27 %
Lymphocytes Relative: 18 %
Lymphs Abs: 3 10*3/uL (ref 0.7–4.0)
Lymphs Abs: 2.4 10*3/uL (ref 0.7–4.0)
MCH: 29.4 pg (ref 26.0–34.0)
MCH: 29.4 pg (ref 26.0–34.0)
MCHC: 29.5 g/dL — ABNORMAL LOW (ref 30.0–36.0)
MCHC: 29.4 g/dL — ABNORMAL LOW (ref 30.0–36.0)
MCV: 99.7 fL (ref 80.0–100.0)
MCV: 99.7 fL (ref 80.0–100.0)
Monocytes Absolute: 0.9 10*3/uL (ref 0.1–1.0)
Monocytes Absolute: 1.4 10*3/uL — ABNORMAL HIGH (ref 0.1–1.0)
Monocytes Relative: 9 %
Monocytes Relative: 10 %
Neutro Abs: 6.9 10*3/uL (ref 1.7–7.7)
Neutro Abs: 9.4 10*3/uL — ABNORMAL HIGH (ref 1.7–7.7)
Neutrophils Relative %: 62 %
Neutrophils Relative %: 71 %
Platelets: 169 10*3/uL (ref 150–400)
Platelets: 179 10*3/uL (ref 150–400)
RBC: 3.64 MIL/uL — ABNORMAL LOW (ref 4.22–5.81)
RBC: 3.27 MIL/uL — ABNORMAL LOW (ref 4.22–5.81)
RDW: 15.8 % — ABNORMAL HIGH (ref 11.5–15.5)
RDW: 15.8 % — ABNORMAL HIGH (ref 11.5–15.5)
WBC: 11 10*3/uL — ABNORMAL HIGH (ref 4.0–10.5)
WBC: 13.4 10*3/uL — ABNORMAL HIGH (ref 4.0–10.5)
nRBC: 0 % (ref 0.0–0.2)
nRBC: 0 % (ref 0.0–0.2)

## 2022-11-26 LAB — GLUCOSE, CAPILLARY
Glucose-Capillary: 124 mg/dL — ABNORMAL HIGH (ref 70–99)
Glucose-Capillary: 123 mg/dL — ABNORMAL HIGH (ref 70–99)
Glucose-Capillary: 152 mg/dL — ABNORMAL HIGH (ref 70–99)
Glucose-Capillary: 156 mg/dL — ABNORMAL HIGH (ref 70–99)
Glucose-Capillary: 135 mg/dL — ABNORMAL HIGH (ref 70–99)

## 2022-11-26 LAB — BASIC METABOLIC PANEL
Anion gap: 12 (ref 5–15)
BUN: 14 mg/dL (ref 6–20)
CO2: 28 mmol/L (ref 22–32)
Calcium: 9.4 mg/dL (ref 8.9–10.3)
Chloride: 98 mmol/L (ref 98–111)
Creatinine, Ser: 0.49 mg/dL — ABNORMAL LOW (ref 0.61–1.24)
GFR, Estimated: >60 mL/min (ref >60)
Glucose, Bld: 129 mg/dL — ABNORMAL HIGH (ref 70–99)
Potassium: 4 mmol/L (ref 3.5–5.1)
Sodium: 138 mmol/L (ref 135–145)

## 2022-11-26 LAB — CULTURE, BLOOD (ROUTINE X 2)
Culture: NO GROWTH
Special Requests: ADEQUATE
Special Requests: ADEQUATE

## 2022-11-26 LAB — MAGNESIUM: Magnesium: 2 mg/dL (ref 1.7–2.4)

## 2022-11-26 MED ORDER — HYDROMORPHONE HCL 2 MG PO TABS
4.0000 mg | ORAL_TABLET | ORAL | Status: DC | PRN
  Administered 2022-11-26: 6 mg
  Filled 2022-11-26: qty 3

## 2022-11-26 MED ORDER — AMOXICILLIN-POT CLAVULANATE 400-57 MG/5ML PO SUSR
800.0000 mg | Freq: Two times a day (BID) | ORAL | Status: DC
  Administered 2022-11-26: 800 mg
  Filled 2022-11-26 (×2): qty 10

## 2022-11-26 NOTE — Hospital Course (Signed)
  1. Negative for pulmonary embolus. 2. Complete Right Lower Lobe Consolidation has progressed since the CT on 10/25/2022, and is again associated with aspirated or retained secretions in the airway. Recurrent Aspiration not excluded. 3. Superimposed right middle lobe Bronchopneumonia is only mildly improved since 10/25/2022. Minimal right pleural effusion. 4. Increased left lower lobe opacity but more resembles atelectasis than infection. 5.  Emphysema (ICD10-J43.9).

## 2022-11-26 NOTE — Progress Notes (Signed)
NIF -20 x3 attempts Good pt effort

## 2022-11-26 NOTE — Progress Notes (Signed)
Results of NIF are as follows:     -21 cm H2O, -20 cm H2O, -21 cm H2O

## 2022-11-26 NOTE — TOC Progression Note (Addendum)
Transition of Care [facility]) - Progression Note    Patient Details  Name: Ronnie [name] MRN: [id number] Date of Birth: 1976-03-30  [facility]) CM/SW Contact  Larrie Kass, LCSW Phone Number: 11/26/2022, 12:44 PM  Clinical Narrative:    CSW spoke with pt regarding home health rec. Pt requests CSW to speak with his wife. CSW spoke with [name], she reports pt is active with [place] for [facility] services. Pt wife reports that the [facility] was delivered to the home yesterday. She reports wanting a hospital bed and would like it from [facility]. Pt's wife reports she would like the hospital bed delivered before pt is discharged. We will need DME orders, MD made aware. Pt's spouse stated pt will be discharged to [address] [place], [place]. TOC to follow.  Adden 1:30pm  CSW sent referral to [facility] for hospital bed. Bed is set to be delivered today. CSW spoke with pt's spouse she is requesting home O2 and will need EMS transport at d/c .MD made aware.   Expected Discharge Plan: Home w Home Health Services Barriers to Discharge: Continued Medical Work up  Expected Discharge Plan and Services In-house Referral: Clinical Social Work     Living arrangements for the past 2 months: Single Family Home                                       Social Determinants of Health (SDOH) Interventions SDOH Screenings   Food Insecurity: No Food Insecurity (11/22/2022)  Housing: Low Risk  (11/22/2022)  Transportation Needs: No Transportation Needs (11/22/2022)  Utilities: Not At Risk (11/22/2022)  Depression (PHQ2-9): Low Risk  (04/24/2020)  Tobacco Use: High Risk (11/06/2022)    Readmission Risk Interventions     No data to display

## 2022-11-26 NOTE — Progress Notes (Signed)
Daily Progress Note   Patient Name: Ronnie [name]       Date: 11/26/2022 DOB: September 02, 1975  Age: 47 y.o. MRN#: [id number] Attending Physician: Rhetta Mura, MD Primary Care Physician: Claiborne Rigg, NP Admit Date: 10/28/2022 Length of Stay: 5 days  Reason for Consultation/Follow-up: Establishing goals of care and management  Subjective:   CC: Patient comfortable laying in bed. Following up regarding complex medical decision making and symptom management.  Subjective:  Reviewed EMR prior to presenting to bedside.  Patient was started on methadone 10 mg every 8 hours during the day on 11/25/2022.  In the past 24 hours at time of EMR review patient has received p.o. Dilaudid via PEG 4 mg x 2 doses and IV Dilaudid 2 mg x 3 doses.  Presented to bedside in afternoon to follow-up on patient's pain management.  Patient seen laying comfortably in bed.  Patient conversing easily with his wife via speaker phone.  Reintroduced myself as a member of the palliative medicine team.  Able to review patient's pain regimen at this time.  Will continue on current dose of methadone to allow long-acting medication to get to continuous state in his system.  Discussed patient's Dilaudid tablet which he is receiving via PEG.  Patient feels that 4 mg dose does help though not enough.  Noted would adjust to a lower range on Dilaudid via PEG.  Hoping can use medicine via PEG over IV medication since IV cannot be continued at home.  Spent time discussing regimen with the wife as well.  Patient's wife requesting liquid formulations of medications at time of discharge.  Noted would inform hospitalist and pharmacy of this.  Wife's primary concern at this time as patient receiving a hospital bed to assist with aspiration management.  Noted she is already talked to [facility] about this.  Noted would follow-up with TOC if needed though after visit summary in EMR reviewed TOC is already assisting with coordination of this.  All  questions answered at that time.  Provided emotional support via active listening.  Thanked patient and his wife for allowing me to visit with him today.  Updated IDT after visit with patient.  Review of Systems Pain improved Objective:   Vital Signs:  BP 106/75 (BP Location: Right Arm)   Pulse (!) 121   Temp 98.3 F (36.8 C) (Oral)   Resp 20   Ht 5\' 11"  (1.803 m)   Wt 65.4 kg   SpO2 99%   BMI 20.11 kg/m   Physical Exam: General: NAD, alert, chronically ill appearing, frail Eyes: No drainage noted HENT: Dry mucous membranes Cardiovascular: Tachycardia noted Respiratory: no increased work of breathing noted, not in respiratory distress Extremities: Muscle wasting present in all extremities Neuro: A&Ox4, following commands easily Psych: appropriately answers all questions  Imaging:  I personally reviewed recent imaging.   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 Patient well-known to palliative service: extensive goals of care discussions were done the last time the patient was in the hospital admitted under teaching service, goals were revised to comfort-care, fentanyl PCA was started and the patient was sent [facility].  However, patient's family was not in favor of this approach, [facility] and taken to [facility] for second opinion.  Over there, the patient was placed on PEG tube and started on  tube feeds and transition to home. He is admitted again with possible bilateral aspiration pneumonia, healthcare respiratory pneumonia with CT chest showing complete right lower lobe consolidation, superimposed right middle lobe bronchopneumonia, increased left lower lobe opacity and emphysema.  He remains on broad-spectrum antibiotics and IV  fluids.  Recommendations/Plan: # Complex medical decision making/goals of care:  -Patient electing to return home with palliative medicine services (NOT hospice) once appropriate medically for discharge. [facility] already involved as per EMR review so they would need to coordinate home palliative visit.   -  Code Status: DNR  # Symptom management:  -Pain/Dyspnea, in the setting of muscular dystrophy   -Continue Methadone 10mg  q8hours scheduled via PEG during the day.    -QTc noted to be 486 on 6/28 EKG. Will continue on BID mag oxide supplement as well. Reviewed risks and benefits of medication with patient who agreed with transition to methadone as long acting opioid medication at this time. Hesitant to use fentanyl patch due to patient's lack of fat which could affect absorption. Ordered to recheck EKG on 7/4. Please avoid medication that inhibit methadone clearance and minimize other QTc prolonging medications.    - Increase dilaudid tablet to 4-8mg  q4hrs prn via PEG for short acting opioid management.    - Continue IV dilaudid 1-2mg  q2hrs prn for breakthrough after pill medication.  Wife requesting liquid formulations of medication when possible upon discharge please. Informed hospitalist and pharmacy of this.   # Psychosocial Support:  -wife, sister  # Discharge Planning: Home with Palliative Services (via [facility])  Discussed with: patient, hospitalist,  patient's wife  Thank you for allowing the palliative care team to participate in the care Ronnie [name].  Alvester Morin, DO Palliative Care Provider PMT # [phone]  If patient remains symptomatic despite maximum doses, please call PMT at [phone] between 0700 and 1900. Outside of these hours, please call attending, as PMT does not have night coverage.  *Please note that this is a verbal dictation therefore any spelling or grammatical errors are due to the "Dragon Medical One" system interpretation.

## 2022-11-26 NOTE — Progress Notes (Addendum)
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 EMG showed myopathic changes in proximal muscles--- has been easily trialed steroids and IVIG- was hospitalized at [facility] 6/1 through 10/31/2022--was discharged on hospice Sought second opinion and hospitalized 11/05/2022-11/20/2022 at [facility] for this--- had a PEG tube placed-patient opted to revoke home hospice and wanted to go home with home health PT OT RN and patient was given a hospital bed bedside commode transfer bench but would benefit from a custom power wheelchair at that time NICM with HFrEF as well as EF 35-40% Chronic pain Prior EtOH/cocaine abuse previously, previous tobacco abuse  Presented to [facility] 6/28 with difficulty reading nonproductive cough White count 11.5 hemoglobin 11.8 BNP 127 lactic acid X2 troponin normal Portable CXR confluent right lower lobe?  Small pleural effusion CTA chest negative for PE Complete right lower lobe consolidation progressive with superimposed right middle lobe bronchopneumonia Patient was admitted for pneumonia  Hospital-Problem based course  Bilateral aspiration pneumonia in the setting of progressive myopathy See below conversation regarding hospice--Dr. Hanley Ben has had lengthy conversations regarding goals of care SLP input PT seeing patient as well--to be n.p.o. with ice chips as needed for oral care and home health PT Pulmonology saw patient and signed off Transition from Zosyn to Augmentin solution to complete 14 days total of therapy ending 12/03/2022-stop date placed  Status post PEG tube Still aspirating-outpatient palliative care to discuss further with him--- continue free water 100 every 4 Osmolite as per dietitian  Progressive Myofibrillar Myopathy NIF - 20 x 3 with good effort, rpt as able by  RT  Leukocytosis probably from continued aspiration Outpatient x-rays as needed-no high-grade temps today  NICM, HF R EF without any acute exacerbation Was on metoprolol previously-held at this time  Goals of care:--chatted with patient about his diagnosis--tells me "he should have known that when he went from a cane to wheelchair in 2024 that things were progressing"--- states he was not told that this was a progressive disease when he went for his opinion in [place]--- he does not have a neurologist currently Seems to understand that this may be life-limiting disease--wife was not present at the bedside--- patient seems to want to give it 1 more chance to treat the pneumonia and I will transition antibiotics as above--- if he is stable in a.m. likely can discharge home with palliative following  DVT prophylaxis: SCD Code Status: Full Family Communication: None present Disposition:  Status is: Inpatient Remains inpatient appropriate because:    Subjective: Coherent pleasant alert no distress Seems a little bit short winded and is coughing-  Objective: Vitals:   11/26/22 0851 11/26/22 0936 11/26/22 1245 11/26/22 1330  BP:      Pulse:      Resp: 20 19 20 19   Temp:      TempSrc:      SpO2:      Weight:      Height:        Intake/Output Summary (Last 24 hours) at 11/26/2022 1600 Last data filed at 11/26/2022 1500 Gross per 24 hour  Intake --  Output 600 ml  Net -600 ml   Filed Weights   11/24/22 1000 11/25/22 0512 11/26/22 0625  Weight: 64.8 kg 65.6 kg 65.4 kg    Examination:  Chronically ill-appearing black male looking about stated age Harsh breath sounds  bilaterally Cachectic Decreased air entry posterolaterally right He is slightly tachycardic No lower extremity edema Able to straight leg raise bilaterally but is weak  Data Reviewed: personally reviewed   CBC    Component Value Date/Time   WBC 11.0 (H) 11/26/2022 0432   RBC 3.64 (L) 11/26/2022 0432    HGB 10.7 (L) 11/26/2022 0432   HGB 14.0 06/09/2019 1541   HCT 36.3 (L) 11/26/2022 0432   HCT 40.3 06/09/2019 1541   PLT 169 11/26/2022 0432   PLT 194 06/09/2019 1541   MCV 99.7 11/26/2022 0432   MCV 93 06/09/2019 1541   MCH 29.4 11/26/2022 0432   MCHC 29.5 (L) 11/26/2022 0432   RDW 15.8 (H) 11/26/2022 0432   RDW 13.3 06/09/2019 1541   LYMPHSABS 3.0 11/26/2022 0432   LYMPHSABS 3.3 (H) 06/09/2019 1541   MONOABS 0.9 11/26/2022 0432   EOSABS 0.2 11/26/2022 0432   EOSABS 0.6 (H) 06/09/2019 1541   BASOSABS 0.0 11/26/2022 0432   BASOSABS 0.1 06/09/2019 1541      Latest Ref Rng & Units 11/26/2022    4:32 AM 11/25/2022    4:50 AM 11/24/2022    3:50 AM  CMP  Glucose 70 - 99 mg/dL 161  096    BUN 6 - 20 mg/dL 14  15    Creatinine 0.45 - 1.24 mg/dL 4.09  8.11  9.14   Sodium 135 - 145 mmol/L 138  137    Potassium 3.5 - 5.1 mmol/L 4.0  4.7    Chloride 98 - 111 mmol/L 98  104    CO2 22 - 32 mmol/L 28  24    Calcium 8.9 - 10.3 mg/dL 9.4  8.8    Total Protein 6.5 - 8.1 g/dL  7.4    Total Bilirubin 0.3 - 1.2 mg/dL  0.3    Alkaline Phos 38 - 126 U/L  70    AST 15 - 41 U/L  15    ALT 0 - 44 U/L  14       Radiology Studies: No results found.   Scheduled Meds:  free water  100 mL Per Tube Q4H    HYDROmorphone (DILAUDID) injection  1 mg Intravenous Once   ipratropium-albuterol  3 mL Nebulization BID   magnesium oxide  400 mg Oral BID   methadone  10 mg Per Tube Q8H   Continuous Infusions:  feeding supplement (OSMOLITE 1.5 CAL) 1,000 mL (11/25/22 0253)   piperacillin-tazobactam (ZOSYN)  IV 3.375 g (11/26/22 1456)     LOS: 5 days   Time spent: 46  Rhetta Mura, MD [facility] To contact the attending provider between 7A-7P or the covering provider during after hours 7P-7A, please log into the web site [email or web address] and access using universal [place] password for that web site. If you do not have the password, please call the hospital operator.  11/26/2022, 4:00 PM

## 2022-11-26 NOTE — Progress Notes (Signed)
PT Cancellation Note  Patient Details Name: Rufino Tack MRN: [id number] DOB: 1975-12-20   Cancelled Treatment:    Reason Eval/Treat Not Completed: Patient declined, no reason specified. Will check back another day as schedule allows.    Faye Ramsay, PT Acute Rehabilitation  Office: [phone]

## 2022-11-26 NOTE — Unmapped External Note (Addendum)
Transition of Care [facility]) - Progression Note       Patient Details   Name: Paul Lloyd  MRN: [id number]  Date of Birth: 1976-04-24    Transition of Care [facility]) CM/SW Contact   Larrie Kass, LCSW  Phone Number:  11/26/2022, 12:44 PM    Clinical Narrative:     CSW spoke with pt regarding home health rec. Pt requests CSW to speak with his wife. CSW spoke with [name], she reports pt is active with [place] for [facility] services. Pt wife reports that the [facility] was delivered to the home yesterday. She reports wanting a hospital bed and would like it from [facility]. Pt's wife reports she would like the hospital bed delivered before pt is discharged. We will need DME orders, MD made aware. Pt's spouse stated pt will be discharged to [address] [place], [place]. TOC to follow.    Adden  1:30pm   CSW sent referral to [facility] for hospital bed. Bed is set to be delivered today. CSW spoke with pt's spouse she is requesting home O2 and will need EMS transport at d/c .MD made aware.     Expected Discharge Plan: Home w Home Health Services  Barriers to Discharge: Continued Medical Work up    Expected Discharge Plan and Services  In-house Referral: Clinical Social Work        Living arrangements for the past 2 months: Single Family Home                                                     Social Determinants of Health (SDOH) Interventions  SDOH Screenings     Food Insecurity: No Food Insecurity (11/22/2022)   Housing: Low Risk  (11/22/2022)   Transportation Needs: No Transportation Needs (11/22/2022)   Utilities: Not At Risk (11/22/2022)   Depression (PHQ2-9): Low Risk  (04/24/2020)   Tobacco Use: High Risk (11/21/2022)       Readmission Risk Interventions      * No data to display                *Some images could not be shown.

## 2022-11-27 ENCOUNTER — Inpatient Hospital Stay (HOSPITAL_COMMUNITY): Payer: Medicare Other

## 2022-11-27 DIAGNOSIS — J9602 Acute respiratory failure with hypercapnia: Secondary | ICD-10-CM | POA: Diagnosis not present

## 2022-11-27 DIAGNOSIS — R4589 Other symptoms and signs involving emotional state: Secondary | ICD-10-CM

## 2022-11-27 DIAGNOSIS — J9601 Acute respiratory failure with hypoxia: Secondary | ICD-10-CM

## 2022-11-27 DIAGNOSIS — J189 Pneumonia, unspecified organism: Secondary | ICD-10-CM | POA: Diagnosis not present

## 2022-11-27 DIAGNOSIS — J9612 Chronic respiratory failure with hypercapnia: Secondary | ICD-10-CM | POA: Diagnosis not present

## 2022-11-27 DIAGNOSIS — Z7189 Other specified counseling: Secondary | ICD-10-CM | POA: Diagnosis not present

## 2022-11-27 DIAGNOSIS — J690 Pneumonitis due to inhalation of food and vomit: Secondary | ICD-10-CM | POA: Diagnosis not present

## 2022-11-27 DIAGNOSIS — Z515 Encounter for palliative care: Secondary | ICD-10-CM | POA: Diagnosis not present

## 2022-11-27 LAB — BLOOD GAS, ARTERIAL
Acid-Base Excess: 2.5 mmol/L — ABNORMAL HIGH (ref 0.0–2.0)
Bicarbonate: 34.3 mmol/L — ABNORMAL HIGH (ref 20.0–28.0)
Drawn by: 29503
O2 Content: 100 L/min
O2 Saturation: 97.4 %
Patient temperature: 37
pCO2 arterial: 113 mmHg (ref 32–48)
pH, Arterial: 7.09 — CL (ref 7.35–7.45)
pO2, Arterial: 94 mmHg (ref 83–108)

## 2022-11-27 LAB — GLUCOSE, CAPILLARY
Glucose-Capillary: 164 mg/dL — ABNORMAL HIGH (ref 70–99)
Glucose-Capillary: 194 mg/dL — ABNORMAL HIGH (ref 70–99)
Glucose-Capillary: 239 mg/dL — ABNORMAL HIGH (ref 70–99)

## 2022-11-27 MED ORDER — SODIUM BICARBONATE 4.2 % IV SOLN: 100.0000 meq | Freq: Once | INTRAVENOUS | Status: DC

## 2022-11-27 MED ORDER — MORPHINE SULFATE (PF) 4 MG/ML IV SOLN: 4.0000 mg | INTRAVENOUS | Status: DC | PRN

## 2022-11-27 MED ORDER — SODIUM BICARBONATE 8.4 % IV SOLN
100.0000 meq | Freq: Once | INTRAVENOUS | Status: AC
  Administered 2022-11-27: 100 meq via INTRAVENOUS
  Filled 2022-11-27: qty 100
  Filled 2022-11-27: qty 50

## 2022-11-27 MED ORDER — GLYCOPYRROLATE 0.2 MG/ML IJ SOLN
0.2000 mg | INTRAMUSCULAR | Status: DC | PRN
  Administered 2022-11-27: 0.2 mg via INTRAVENOUS
  Filled 2022-11-27: qty 1

## 2022-11-27 MED ORDER — ACETAMINOPHEN 325 MG PO TABS: 650.0000 mg | ORAL_TABLET | Freq: Four times a day (QID) | ORAL | Status: DC | PRN

## 2022-11-27 MED ORDER — MIDAZOLAM HCL 2 MG/2ML IJ SOLN
2.0000 mg | INTRAMUSCULAR | Status: DC | PRN
  Administered 2022-11-27: 2 mg via INTRAVENOUS
  Filled 2022-11-27: qty 2

## 2022-11-27 MED ORDER — GLYCOPYRROLATE 0.2 MG/ML IJ SOLN: 0.2000 mg | INTRAMUSCULAR | Status: DC | PRN

## 2022-11-27 MED ORDER — HYDROMORPHONE HCL 1 MG/ML IJ SOLN: 2.0000 mg | INTRAMUSCULAR | Status: DC | PRN

## 2022-11-27 MED ORDER — SODIUM CHLORIDE 0.9 % IV SOLN: INTRAVENOUS | Status: DC

## 2022-11-27 MED ORDER — GLYCOPYRROLATE 1 MG PO TABS: 1.0000 mg | ORAL_TABLET | ORAL | Status: DC | PRN

## 2022-11-27 MED ORDER — POLYVINYL ALCOHOL 1.4 % OP SOLN: 1.0000 [drp] | Freq: Four times a day (QID) | OPHTHALMIC | Status: DC | PRN

## 2022-11-27 MED ORDER — MORPHINE SULFATE (PF) 2 MG/ML IV SOLN
2.0000 mg | INTRAVENOUS | Status: DC | PRN
  Administered 2022-11-27: 4 mg via INTRAVENOUS
  Administered 2022-11-27: 2 mg via INTRAVENOUS
  Filled 2022-11-27 (×2): qty 2

## 2022-11-27 MED ORDER — ACETAMINOPHEN 650 MG RE SUPP: 650.0000 mg | Freq: Four times a day (QID) | RECTAL | Status: DC | PRN

## 2022-11-27 MED ORDER — CHLORHEXIDINE GLUCONATE CLOTH 2 % EX PADS
6.0000 | MEDICATED_PAD | Freq: Every day | CUTANEOUS | Status: DC
  Administered 2022-11-27: 6 via TOPICAL

## 2022-12-25 NOTE — Significant Event (Signed)
Rapid Response Event Note   Reason for Call :  Respiratory distress   Initial Focused Assessment:  Pt agonally breathing, responsive to pain & unable to follow commands.  Pt on a non-rebreather @ 15L. O2 sats 95, HR 125, bp 110/92 (100). Bilateral breath sounds ronchus and extremely congested. MD at bedside.   Interventions:  Transfer to ICU  Plan of Care:  Ultimately, the wife made the decision to go comfort care.    Event Summary:   MD Notified: Dr. Mahala Menghini   Call Time: 0725 Arrival Time: 0730   Rella Larve, Rapid Response RN

## 2022-12-25 NOTE — Discharge Summary (Addendum)
DEATH SUMMARY   Patient Details  Name: Ronnie [name] MRN: [id number] DOB: 1976-05-17  Admission/Discharge Information   Admit Date:  12-19-22  Date of Death:   2022/12/25  Time of Death:  10:30  Length of Stay: 6  Code Status:     Code Status Orders  (From admission, onward)           Start     Ordered   12/25/22 0823  Do not attempt resuscitation (DNR)  Continuous       Question Answer Comment  If patient has no pulse and is not breathing Do Not Attempt Resuscitation   If patient has a pulse and/or is breathing: Medical Treatment Goals COMFORT MEASURES: Keep clean/warm/dry, use medication by any route; positioning, wound care and other measures to relieve pain/suffering; use oxygen, suction/manual treatment of airway obstruction for comfort; do not transfer unless for comfort needs.   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[place] 1/22 EMG showed myopathic changes in proximal muscles--- has been easily trialed steroids and IVIG- was hospitalized at [facility] 6/1 through 10/31/2022--was discharged on hospice Sought second opinion and hospitalized 11/05/2022-11/20/2022 at [facility] for this--- had a PEG tube placed-patient opted to revoke home hospice and wanted to go home with home health PT OT RN and patient was given a hospital bed bedside commode transfer bench but would benefit from a custom power wheelchair at that time NICM with HFrEF as well as EF 35-40% Chronic pain Prior EtOH/cocaine abuse previously, previous tobacco abuse   Presented to [facility] 12-19-2022 with difficulty reading nonproductive cough White count 11.5 hemoglobin 11.8 BNP 127 lactic acid X2 troponin normal Portable CXR confluent right lower lobe?  Small pleural effusion CTA  chest negative for PE Complete right lower lobe consolidation progressive with superimposed right middle lobe bronchopneumonia Patient was admitted for pneumonia  Diagnoses  Preliminary cause of death:  Secondary Diagnoses (including complications and co-morbidities):  Principal Problem:   HCAP (healthcare-associated pneumonia) Active Problems:   Essential hypertension   Muscular dystrophy (HCC)   Chronic systolic heart failure (HCC)   Chronic respiratory failure with hypercapnia (HCC)   Mixed hyperlipidemia   Tobacco dependence due to cigarettes   Normocytic anemia   Aspiration into lower respiratory tract   PEG (percutaneous endoscopic gastrostomy) status (HCC)   Prolonged QT interval   Malnutrition of moderate degree   Aspiration pneumonia of right lower lobe (HCC)   High risk medication use   Goals of care, counseling/discussion   Palliative care encounter   Pain   Medication management    Brief Hospital Course   Patient was readmitted to the hospital with a nonproductive cough on December 19, 2022 and portable x-ray showed a confluent right lower lobe effusion but CT chest confirmed complete right lower lobe consolidation which was progressive superimposed upon a right middle lobe bronchopneumonia and patient was admitted for the same  My partner Dr. Hanley Ben had multiple lengthy discussions with family about goals of care and poor overall likely prognosis with regards to testing-he had speech therapy involved in care and they recommended n.p.o. with only ice chips to wet the mouth It seems that family was resistant to discussions regarding comfort trajectory-it is noted that patient had been admitted/hospitalized  6/1 by critical care through 6/7 and the patient was transferred to the family medicine service and consideration for hospice was strongly encouraged at time of discharge  Family rescinded hospice and took the patient to [facility] where the patient spent 6/12 through 6/27  had a PEG tube placed and was transferred home with a hospital bed  I saw the patient on 7/3 had a lengthy conversation with him and explained to him as per my charting on 7/3 that he has an incurable illness-patient expressed that he wanted to give treating the pneumonia "1 try" and subsequently on 7/4 I was called to the room at around 07:30--- patient had agonal stertorous respirations and a decreased LOC GCS of about 5 or 6 I immediately called his wife had a long discussion with her and she asked that I speak to a family member Shondra--we consulted critical care after the ABG resulted showing a pH of 7.09 and a pCO2 of 113 Dr. Marchelle Gearing your critical care had a long discussion with the family additionally and echoed care team sentiment that patient reserve as well as prognosis was extremely guarded with/without invasive mechanical ventilation and patient's DNR was maintained  Patient was kept comfortable and expired 10:15 AM    1. Negative for pulmonary embolus. 2. Complete Right Lower Lobe Consolidation has progressed since the CT on 10/25/2022, and is again associated with aspirated or retained secretions in the airway. Recurrent Aspiration not excluded. 3. Superimposed right middle lobe Bronchopneumonia is only mildly improved since 10/25/2022. Minimal right pleural effusion. 4. Increased left lower lobe opacity but more resembles atelectasis than infection. 5.  Emphysema (ICD10-J43.9).      Ronnie [name] 12/09/2022, 10:20 AM

## 2022-12-25 NOTE — Progress Notes (Signed)
NAME:  Ronnie [name], MRN:  [id number], DOB:  Apr 01, 1976, LOS: 6 ADMISSION DATE:  11/11/2022, CONSULTATION DATE: November 23, 2022 REFERRING MD:  Dr. 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  Status post recent PEG tube placement and [facility].  Has chronic systolic heart failure.  Other medical problems include hypertension, hyperlipidemia, normocytic anemia, tobacco dependence, history of cocaine abuse in remission, history of alcohol abuse in remission.  Was discharged on 10/31/2022 to residential hospice.  Was transferred from there to [facility].  Ended up with PEG tube placement on 11/11/2022.  Admitted now with elevated white count normal lactic acid normal troponins.  However has lower lobe collapse on the right lung with consolidation, small effusion concerning for progressive pneumonia.  Patient was started on IV antibiotics.  Pulmonary critical care was consulted for recommendations.  Pertinent  Medical History   Past Medical History:  Diagnosis Date   Hypertension    Muscular dystrophy (HCC)    Substance abuse (HCC)      Significant Hospital Events: Including procedures, antibiotic start and stop dates in addition to other pertinent events   11/14/2022 - admit 11/26/22 - PCCM signed off  Interim History / Subjective:   11/30/2022: Brought rapidly to the intensive care unit following respiratory distress and updated encephalopathy.  ABG shows significant respiratory acidosis with a pH 7.0 and carbon dioxide retention.  Patient is agonal.  According to hospitalist Dr. Mahala Menghini patient family is in significant spiritual distress and we are reversing CODE STATUS.  Chart review shows that as recently as 11/05/2022 he was informed at [facility] that he was hospice eligible  and had progressive myopathy.  Review of the chart indicates patient and family been struggling spiritually with goals of care and coming to terms with illness.   Objective   Blood pressure 112/89, pulse (!) 125, temperature 99.2 F (37.3 C), temperature source Oral, resp. rate 14, height 5\' 11"  (1.803 m), weight 63 kg, SpO2 100 %.    FiO2 (%):  [100 %] 100 %   Intake/Output Summary (Last 24 hours) at 12/13/2022 0829 Last data filed at 11/30/2022 0300 Gross per 24 hour  Intake 1838.44 ml  Output 600 ml  Net 1238.44 ml   Filed Weights   11/25/22 0512 11/26/22 0625 11/30/2022 0500  Weight: 65.6 kg 65.4 kg 63 kg    Examination: Thin male being rushed to the ICU.  He is unconscious.  He is diaphoretic.  Agonal  Resolved Hospital Problem list     Assessment & Plan:   Bilateral aspiration pneumonia, healthcare associated, present on admission Acute on chronic hypoxemic hypercarbic respiratory failure secondary to neuromuscular disease, progressive neuromuscular dystrophy Status post PEG tube placement Chronic systolic heart failure, appears euvolemic Leukocytosis secondary to above Acute on chronic anemia of chronic disease Mixed hyperlipidemia Chronic tobacco abuse, stopped smoking approximately 1 month ago. Severe protein calorie malnutrition, BMI 18, present on admission  11/29/2022: Significant respiratory failure with respiratory acidosis and hypercarbia.  This is associated with coma.   Plan: -Emergency discussion had with the family in the presence of Dr. Mahala Menghini the hospitalist.  Present on the call was Ronnie [name] the spouse and patient's sister.  Explained to them the patient was on the verge of passing away.  They asked about the medical benefits of ventilator therapy.  Explained to  them that ventilator therapy is not going to be a curative in the setting of progressive myopathy or supportive.  They understood that he has significant muscular disease and cardiomyopathy that  is of terminal prognosis.   Explained concept of comfort and continued compassionate care Explained MD, RN, and RT role in this Explained that we are only stopping medicines and therapies  that are not effective and are only prolonging her suffering Explained that vent and pressors are not consistent with her goals Explained that we are still caring for patient by providing care aimed at comfort, agony, pain, distress  Explained that medicines used for comfort are morphine and benzo and explained the doctrine of double effect   They are appreciative and to continue to care for .Ronnie [name] protecting his comfort and dignity Prognosis likely hours to days  Wrote orders for morphine gtt Family given numer of ICU to contact  [facility] CCM will sign off  ATTESTATION & SIGNATURE   The patient Ronnie [name] is critically ill with multiple organ systems failure and requires high complexity decision making for assessment and support, frequent evaluation and titration of therapies, application of advanced monitoring technologies and extensive interpretation of multiple databases and discussion with other appropriate health care personnel such as bedside nurses, social workers, case Production designer, theatre/television/film, consultants, respiratory therapists, nutritionists, secretaries etc.,  Critical care time includes but is not restricted to just documentation time. Documentation can happen in parallel or sequential to care time depending on case mix urgency and priorities for the shift. So, overall critical Care Time devoted to patient care services described in this note is  35  Minutes.   This time reflects time of care of this signee Dr Kalman Shan which includ does not reflect procedure time, or teaching time or supervisory time of PA/NP/Med student/Med Resident etc but could involve care discussion time     Dr. Kalman Shan, M.D., [facility].C.P Pulmonary and Critical Care Medicine Staff Physician,   System [place] [facility] Pager: [phone], If no answer or between  15:00h - 7:00h: call [phone]  11/24/2022 8:32 AM    LABS    PULMONARY Recent Labs  Lab 12/04/2022 0749  PHART 7.09*  PCO2ART 113*  PO2ART 94  HCO3 34.3*  O2SAT 97.4    CBC Recent Labs  Lab 11/25/22 0450 11/26/22 0432 11/26/22 2136  HGB 10.5* 10.7* 9.6*  HCT 36.5* 36.3* 32.6*  WBC 13.0* 11.0* 13.4*  PLT 157 169 179    COAGULATION No results for input(s): "INR" in the last 168 hours.  CARDIAC  No results for input(s): "TROPONINI" in the last 168 hours. No results for input(s): "PROBNP" in the last 168 hours.   CHEMISTRY Recent Labs  Lab 11/23/2022 0312 11/09/2022 0524 10/31/2022 0524 11/05/2022 1645 11/22/22 0512 11/22/22 1637 11/23/22 0507 11/24/22 0350 11/25/22 0450 11/26/22 0432  NA 140  --   --   --   --   --   --   --  137 138  K 3.9  --   --   --   --   --   --   --  4.7 4.0  CL 103  --   --   --   --   --   --   --  104 98  CO2 27  --   --   --   --   --   --   --  24 28  GLUCOSE 113*  --   --   --   --   --   --   --  154* 129*  BUN 16  --   --   --   --   --   --   --  15 14  CREATININE 0.68  --   --   --   --   --   --  0.48* 0.54* 0.49*  CALCIUM 9.9  --   --   --   --   --   --   --  8.8* 9.4  MG  --  1.8   < > 2.2 2.2 2.0 2.3  --  2.1 2.0  PHOS  --  3.5  --  4.1 4.3 4.5 4.4  --   --   --    < > = values in this interval not displayed.   Estimated Creatinine Clearance: 100.6 mL/min (A) (by C-G formula based on SCr of 0.49 mg/dL (L)).   LIVER Recent Labs  Lab 11/07/2022 0312 11/25/22 0450  AST 24 15  ALT 21 14  ALKPHOS 76 70  BILITOT 0.5 0.3  PROT 9.4* 7.4  ALBUMIN 3.6 2.9*     INFECTIOUS Recent Labs  Lab 11/23/2022 0312 11/11/2022 0524 11/14/2022 2308  LATICACIDVEN 1.1 0.9 1.3     ENDOCRINE CBG (last 3)  Recent Labs    11/25/2022 0031 12/13/2022 0418 12/23/2022 0724  GLUCAP 164* 194* 239*         IMAGING x48h  - image(s)  personally visualized  -   highlighted in bold No results found.

## 2022-12-25 NOTE — IPAL (Signed)
Called to bedside by night RN  Patient agonally breathing--unresponsive to conversation.  Looks extremely ill GCS 5, all accessory muscles in use Stertorous breathing Abd soft No LE edema  Arterial Blood Gas result:  pO2 100; pCO2 113; pH 7.09;.   See subsequent discussion as per Dr. Lawernce Pitts spoke at length to his wife Ronnie [name] and sister Isaiah Serge surrounding all circumstances around his admission at [facility] and his recurrent follow-up here within 1 month.  I went over all aspects of care.  I discussed and spoke to his progressive disease, poor functional reserve and high likelihood of morbidity and Mortality  I discussed this in detail with Dr. Marchelle Gearing, CCM--see his separate note  After long discussions, he remains DNR/DNI with a goal of comfort.  Appreciative of CCM expertise  > 45 minutes CC and palliative time  Pleas Koch, MD [facility] 8:29 AM

## 2022-12-25 NOTE — Progress Notes (Signed)
NIFnot done due to pt condition. Pt unresponsive and on 100% NRB at this time.

## 2022-12-25 NOTE — Progress Notes (Signed)
Daily Progress Note   Patient Name: Ronnie [name]       Date: 12/22/2022 DOB: 1976-05-21  Age: 47 y.o. MRN#: [id number] Attending Physician: Rhetta Mura, MD Primary Care Physician: Claiborne Rigg, NP Admit Date: 10/31/2022 Length of Stay: 6 days  Reason for Consultation/Follow-up: Establishing goals of care and management  Subjective:   CC: Patient unresponsive.  Following up regarding complex medical decision making and symptom management.  Subjective:  Discussed care with hospitalist and bedside RN prior to presenting to bedside.  Patient rapidly deteriorated this morning and was transferred to ICU for higher level of care.  Hospitalist and PCCM provider able to discuss care with family and patient has now been transition to comfort focused care.  Presented to bedside in ICU to follow-up on patient's symptom management.  Patient unresponsive when seen. Noted to have increased work of breathing and tacycardia that were not managed on current medication doses.  No family present at bedside.  Able to adjust IV medications further for comfort focused care.  Present at bedside at patient's time of death which was 10:15 AM. This provider called wife and updated her regarding patient's dying.  Wife planning to come to bedside to say goodbye before he transfers out of room.  Discussed care extensively with bedside RNs and updated hospitalist.  Objective:   Vital Signs:  BP 112/89 (BP Location: Right Arm)   Pulse (!) 125   Temp 99.2 F (37.3 C) (Oral)   Resp 14   Ht 5\' 11"  (1.803 m)   Wt 63 kg   SpO2 100%   BMI 19.37 kg/m   Physical Exam: General: unresponsive, chronically ill appearing, frail HENT: Dry mucous membranes Cardiovascular: Tachycardia noted Respiratory: increased work of breathing noted Extremities: Muscle wasting present in all extremities Neuro: Unresponsive  Imaging:  I personally reviewed recent imaging.   Assessment & Plan:   Assessment: Patient  is a 47 year old gentleman with life limiting illness of muscular dystrophy with resultant chronic respiratory failure and recurrent aspiration, chronic systolic heart failure dyslipidemia and anemia. Patient well-known to palliative service: extensive goals of care discussions were done the last time the patient was in the hospital admitted under teaching service, goals were revised to com fort-care, fentanyl PCA was started and the patient was sent to [facility].  However, patient's family was not in favor of this approach, patient was discharged from [facility] and taken to [facility] for second opinion.  Over there, the patient was placed on PEG tube and started on tube feeds and transition to home. He is admitted again with possible bilateral aspiration pneumonia, healthcare respiratory pneumonia with CT chest showing complete right lower lobe consolidation, superimposed right middle lobe bronchopneumonia, increased left lower lobe opacity and emphysema.  He remains on broad-spectrum antibiotics and IV fluids.  Recommendations/Plan: # Complex medical decision making/goals of care:  -Patient's medical status rapidly deteriorated this morning.  After discussion between hospitalist, PCCM provider, and patient's wife and sister, patient was transition to full comfort focused care.  Patient passed away this morning at 10:15 AM.  -  Code Status: DNR  # Symptom management:  -Pain/Dyspnea, in the setting of end of life care   -Increased IV dilaudid to 2mg  q3mins prn. Considered starting infusion though by time patient was seen, was actively dying.     # Psychosocial Support:  -wife, sister. Called wife to update her about patient's death.   # Discharge Planning: In hospital death occurred after transition to comfort focused care  Discussed with: RN, hospitalist,  patient's wife  Thank you for allowing the palliative care team to participate in the care Cassandra Ta.  Alvester Morin, DO Palliative  Care Provider PMT # [phone]  If patient remains symptomatic despite maximum doses, please call PMT at [phone] between 0700 and 1900. Outside of these hours, please call attending, as PMT does not have night coverage.  *Please note that this is a verbal dictation therefore any spelling or grammatical errors are due to the "[employer]" system interpretation.

## 2022-12-25 NOTE — Progress Notes (Signed)
   12/24/2022 1212  Spiritual Encounters  Type of Visit Initial  Care provided to: Family  Conversation partners present during encounter Nurse  Referral source Nurse (RN/NT/LPN)  Reason for visit Patient death  OnCall Visit No  Spiritual Framework  Presenting Themes Meaning/purpose/sources of inspiration;Values and beliefs  Values/beliefs Jesus "is the only one"  Family Stress Factors Loss  Interventions  Spiritual Care Interventions Made Compassionate presence;Established relationship of care and support;Normalization of emotions;Explored values/beliefs/practices/strengths;Bereavement/grief support;Prayer;Supported grief process  Intervention Outcomes  Outcomes Connection to spiritual care;Awareness around self/spiritual resourses;Awareness of support   Chaplain responded to page for patient death. Chaplain provided care for family. Wife [name]) and other family members were present at bedside. Family requested prayer for strength for the family.   Arlyce Dice , Chaplain Resident

## 2022-12-25 DEATH — deceased
